# Patient Record
Sex: Male | Born: 1960 | Race: Black or African American | Hispanic: No | Marital: Married | Smoking: Current every day smoker
Health system: Southern US, Community
[De-identification: ages and names within clinical notes are randomized; demographics above are authoritative.]

## PROBLEM LIST (undated history)

## (undated) ENCOUNTER — Ambulatory Visit: Admission: EM | Payer: Medicaid Other | Source: Home / Self Care

## (undated) DIAGNOSIS — M2041 Other hammer toe(s) (acquired), right foot: Secondary | ICD-10-CM

## (undated) DIAGNOSIS — M1990 Unspecified osteoarthritis, unspecified site: Secondary | ICD-10-CM

## (undated) DIAGNOSIS — M4802 Spinal stenosis, cervical region: Secondary | ICD-10-CM

## (undated) DIAGNOSIS — M5412 Radiculopathy, cervical region: Secondary | ICD-10-CM

## (undated) HISTORY — PX: ABCESS DRAINAGE: SHX399

## (undated) HISTORY — DX: Unspecified osteoarthritis, unspecified site: M19.90

---

## 2011-03-23 ENCOUNTER — Inpatient Hospital Stay (INDEPENDENT_AMBULATORY_CARE_PROVIDER_SITE_OTHER)
Admission: RE | Admit: 2011-03-23 | Discharge: 2011-03-23 | Disposition: A | Discharge status: Home / Self Care | Payer: Self-pay | Source: Ambulatory Visit | Attending: Family Medicine | Admitting: Family Medicine

## 2011-03-23 DIAGNOSIS — L02519 Cutaneous abscess of unspecified hand: Secondary | ICD-10-CM

## 2015-04-07 ENCOUNTER — Ambulatory Visit: Payer: Self-pay

## 2015-05-08 ENCOUNTER — Ambulatory Visit: Payer: Self-pay

## 2016-08-23 ENCOUNTER — Emergency Department (HOSPITAL_COMMUNITY)
Admission: EM | Admit: 2016-08-23 | Discharge: 2016-08-24 | Disposition: A | Discharge status: Other Acute Inpatient Hospital | Payer: Self-pay | Attending: Emergency Medicine | Admitting: Emergency Medicine

## 2016-08-23 ENCOUNTER — Emergency Department (HOSPITAL_COMMUNITY): Payer: Self-pay

## 2016-08-23 ENCOUNTER — Encounter (HOSPITAL_COMMUNITY): Payer: Self-pay | Admitting: *Deleted

## 2016-08-23 DIAGNOSIS — K122 Cellulitis and abscess of mouth: Secondary | ICD-10-CM | POA: Insufficient documentation

## 2016-08-23 DIAGNOSIS — F17200 Nicotine dependence, unspecified, uncomplicated: Secondary | ICD-10-CM | POA: Insufficient documentation

## 2016-08-23 DIAGNOSIS — Z79899 Other long term (current) drug therapy: Secondary | ICD-10-CM | POA: Insufficient documentation

## 2016-08-23 LAB — URINALYSIS, ROUTINE W REFLEX MICROSCOPIC
Bilirubin Urine: NEGATIVE
Glucose, UA: NEGATIVE mg/dL
Hgb urine dipstick: NEGATIVE
Ketones, ur: 15 mg/dL — AB
LEUKOCYTES UA: NEGATIVE
NITRITE: NEGATIVE
Protein, ur: NEGATIVE mg/dL
SPECIFIC GRAVITY, URINE: 1.011 (ref 1.005–1.030)
pH: 5.5 (ref 5.0–8.0)

## 2016-08-23 LAB — COMPREHENSIVE METABOLIC PANEL
ALK PHOS: 100 U/L (ref 38–126)
ALT: 15 U/L — AB (ref 17–63)
ANION GAP: 14 (ref 5–15)
AST: 25 U/L (ref 15–41)
Albumin: 3.3 g/dL — ABNORMAL LOW (ref 3.5–5.0)
BUN: 9 mg/dL (ref 6–20)
CALCIUM: 9.5 mg/dL (ref 8.9–10.3)
CHLORIDE: 103 mmol/L (ref 101–111)
CO2: 18 mmol/L — ABNORMAL LOW (ref 22–32)
CREATININE: 1.22 mg/dL (ref 0.61–1.24)
Glucose, Bld: 57 mg/dL — ABNORMAL LOW (ref 65–99)
Potassium: 4.1 mmol/L (ref 3.5–5.1)
Sodium: 135 mmol/L (ref 135–145)
Total Bilirubin: 0.7 mg/dL (ref 0.3–1.2)
Total Protein: 8.4 g/dL — ABNORMAL HIGH (ref 6.5–8.1)

## 2016-08-23 LAB — I-STAT CHEM 8, ED
BUN: 10 mg/dL (ref 6–20)
CALCIUM ION: 1.14 mmol/L — AB (ref 1.15–1.40)
Chloride: 105 mmol/L (ref 101–111)
Creatinine, Ser: 1.3 mg/dL — ABNORMAL HIGH (ref 0.61–1.24)
Glucose, Bld: 57 mg/dL — ABNORMAL LOW (ref 65–99)
HCT: 45 % (ref 39.0–52.0)
HEMOGLOBIN: 15.3 g/dL (ref 13.0–17.0)
Potassium: 4 mmol/L (ref 3.5–5.1)
SODIUM: 137 mmol/L (ref 135–145)
TCO2: 21 mmol/L (ref 0–100)

## 2016-08-23 LAB — CBC WITH DIFFERENTIAL/PLATELET
BASOS ABS: 0 10*3/uL (ref 0.0–0.1)
Basophils Relative: 0 %
Eosinophils Absolute: 0.3 10*3/uL (ref 0.0–0.7)
Eosinophils Relative: 2 %
HEMATOCRIT: 41.4 % (ref 39.0–52.0)
Hemoglobin: 14.5 g/dL (ref 13.0–17.0)
LYMPHS PCT: 7 %
Lymphs Abs: 1.2 10*3/uL (ref 0.7–4.0)
MCH: 32.9 pg (ref 26.0–34.0)
MCHC: 35 g/dL (ref 30.0–36.0)
MCV: 93.9 fL (ref 78.0–100.0)
MONOS PCT: 8 %
Monocytes Absolute: 1.4 10*3/uL — ABNORMAL HIGH (ref 0.1–1.0)
Neutro Abs: 14 10*3/uL — ABNORMAL HIGH (ref 1.7–7.7)
Neutrophils Relative %: 83 %
PLATELETS: 210 10*3/uL (ref 150–400)
RBC: 4.41 MIL/uL (ref 4.22–5.81)
RDW: 12.3 % (ref 11.5–15.5)
WBC: 16.9 10*3/uL — AB (ref 4.0–10.5)

## 2016-08-23 LAB — I-STAT CG4 LACTIC ACID, ED
LACTIC ACID, VENOUS: 1.02 mmol/L (ref 0.5–1.9)
Lactic Acid, Venous: 2.97 mmol/L (ref 0.5–1.9)

## 2016-08-23 IMAGING — CT CT NECK W/ CM
3 of 4 series · 12 of 33 positions shown, 14 images · IV contrast (Omni 300)
Comparison: None.

CLINICAL DATA: Bilateral submandibular neck swelling.

EXAM:
CT NECK WITH CONTRAST
TECHNIQUE: Multidetector CT imaging of the neck was performed using the
standard protocol following the bolus administration of intravenous
contrast.
CONTRAST:  75mL [V0] IOPAMIDOL ([V0]) INJECTION 61%

[Series 4: neck 2.0 st · sagittal · 0.40mm/px · 5 of 101 slices shown, 6 images (1 of 2)]
[im 34/101  bone]
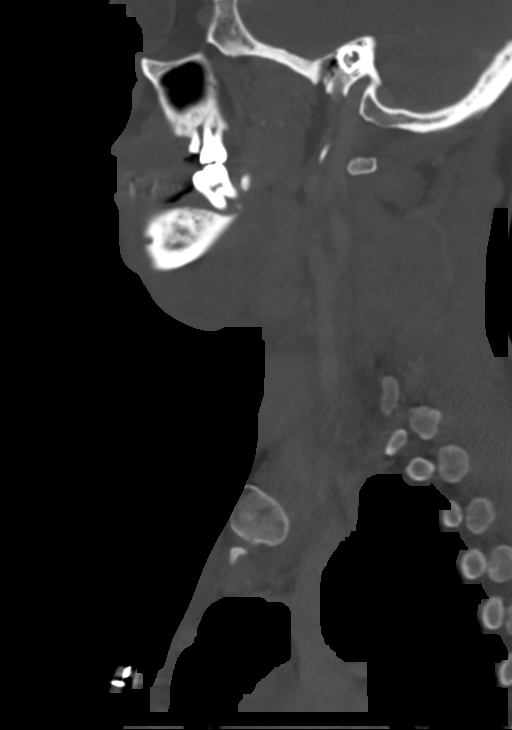
[im 42/101  bone]
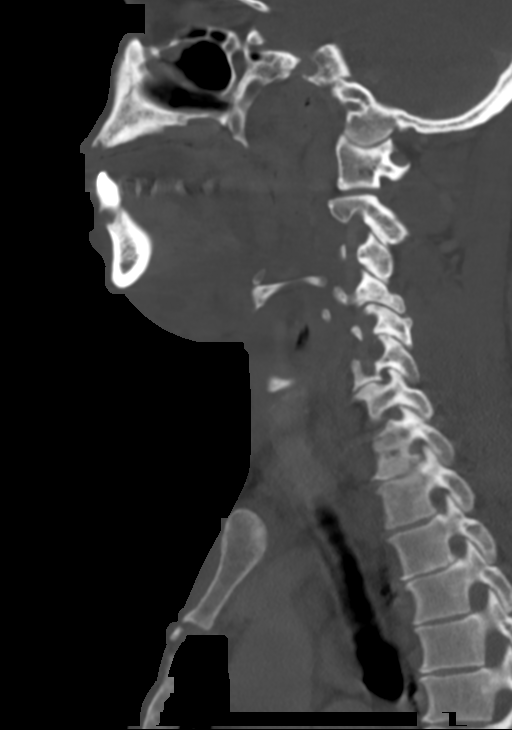
[im 51/101  soft-tissue]
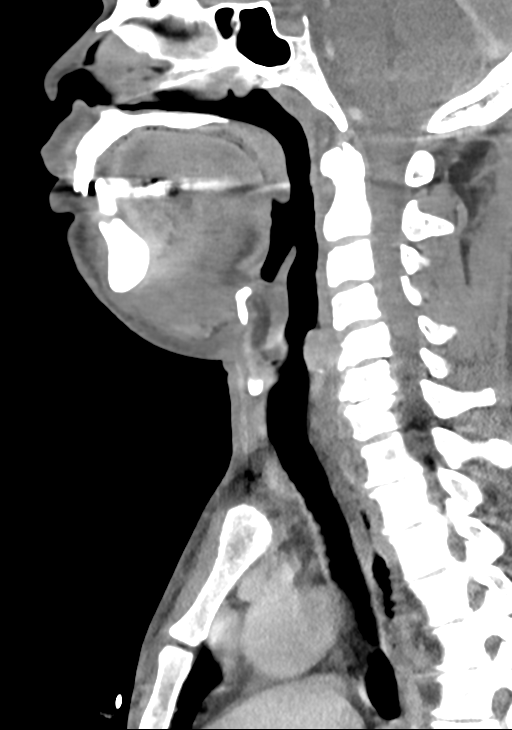
[im 51/101  bone]
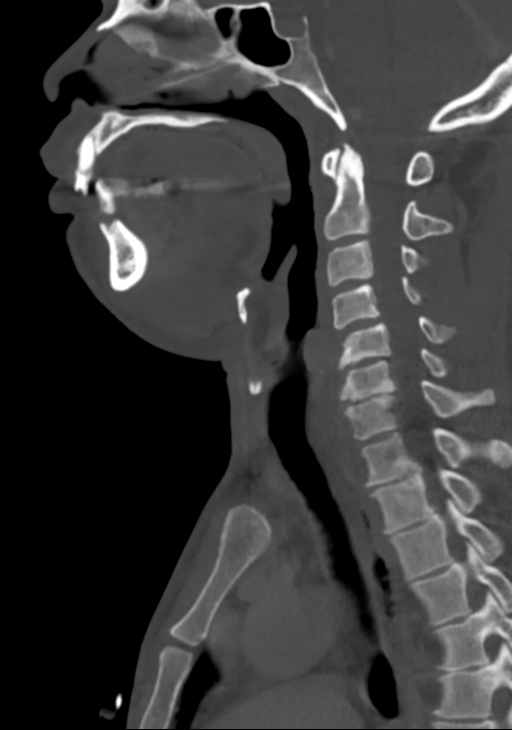
[im 59/101  bone]
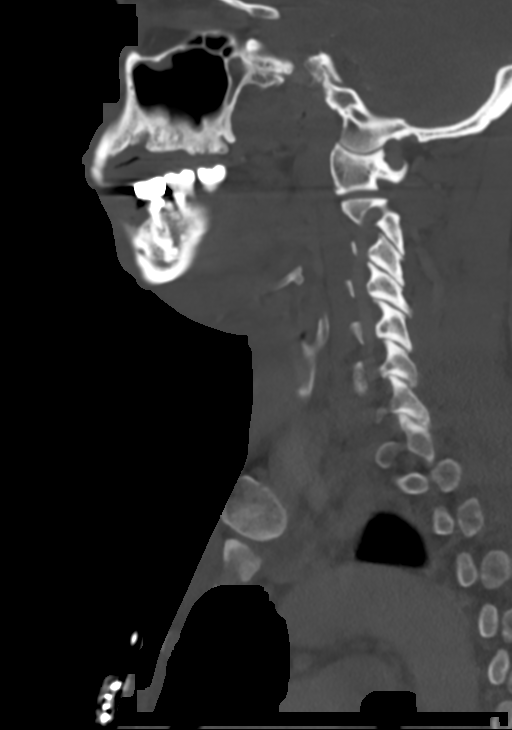
[im 67/101  bone]
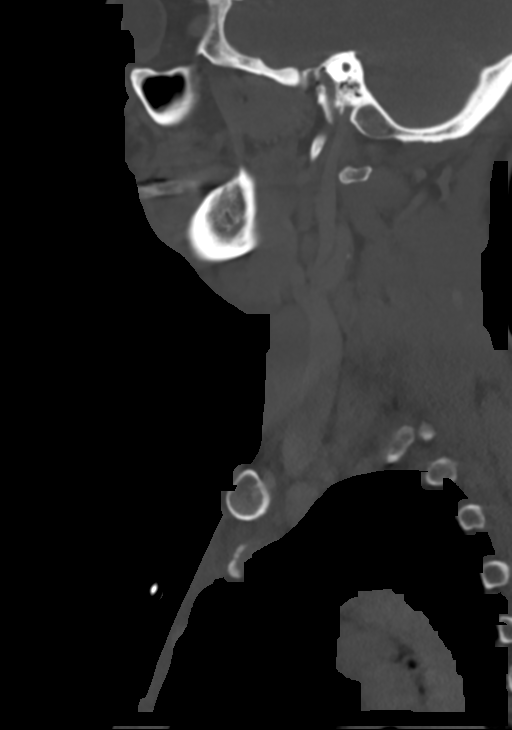

[Series 5: neck 2.0 st · coronal · 0.42mm/px · 3 of 92 slices shown (2 of 2)]
[im 19/92  bone]
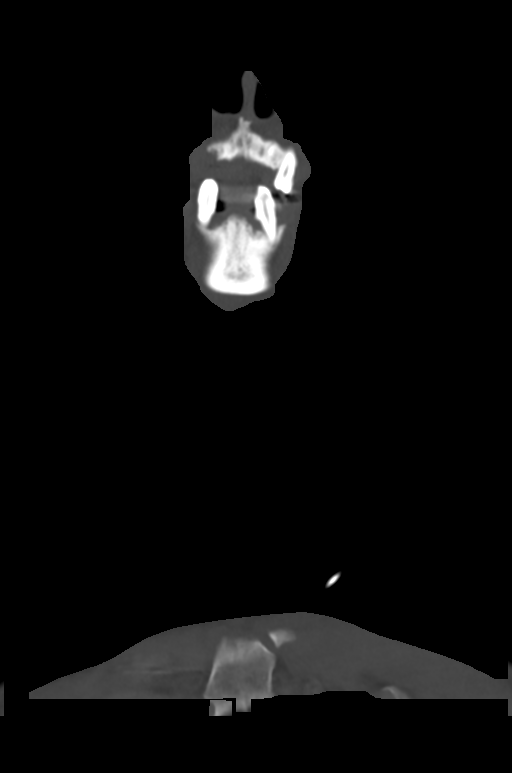
[im 37/92  bone]
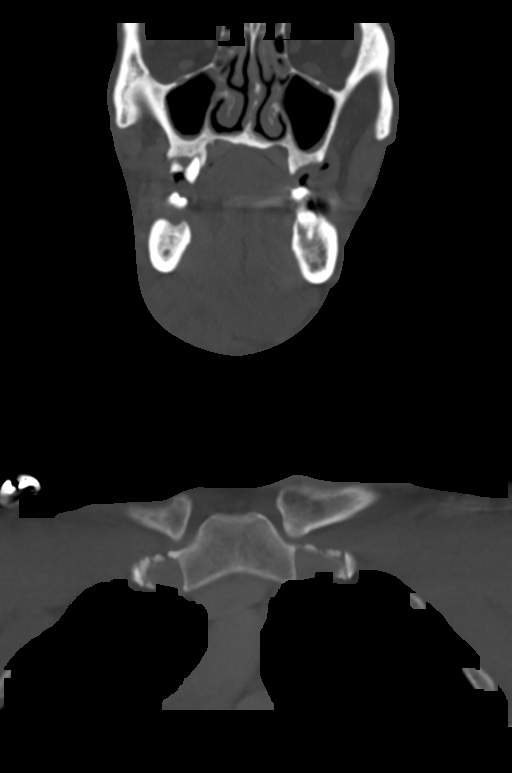
[im 55/92  bone]
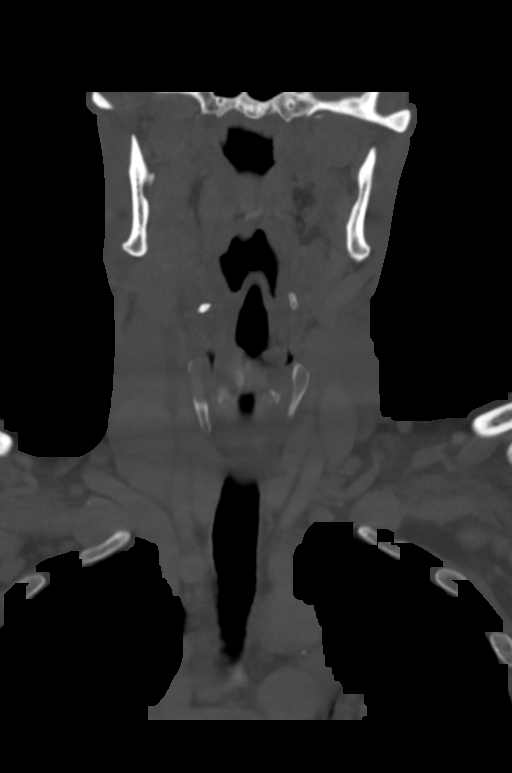

[Series 6: neck 2.0 st orthogonal · axial · 0.39mm/px · z∈[+783,+987]mm · 4 of 155 slices shown, 5 images]
[im 26/155  soft-tissue]
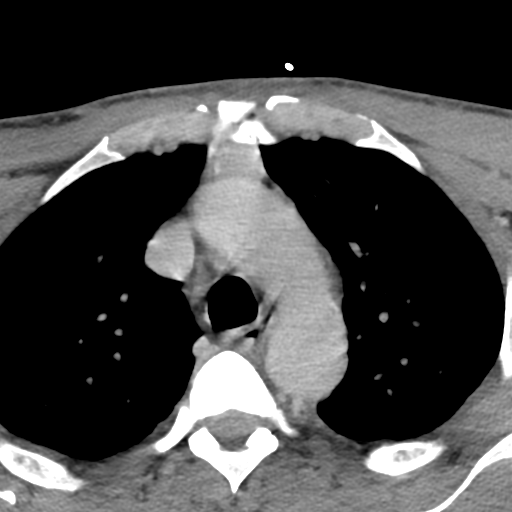
[im 26/155  bone]
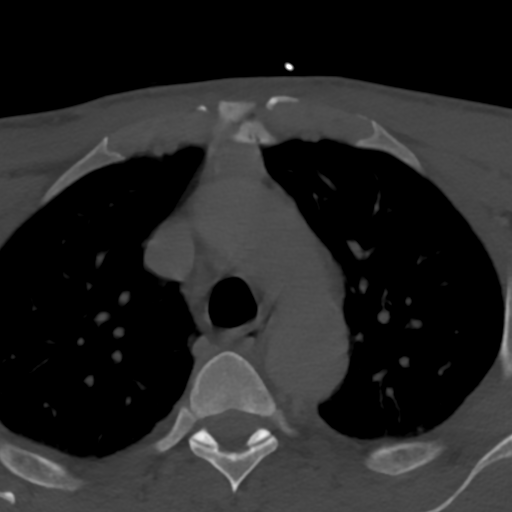
[im 52/155  bone]
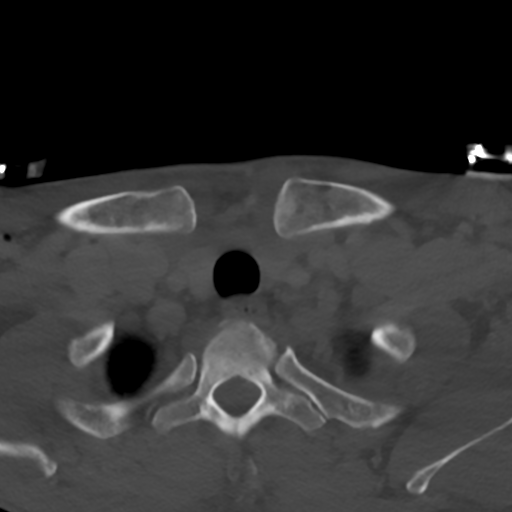
[im 103/155  bone]
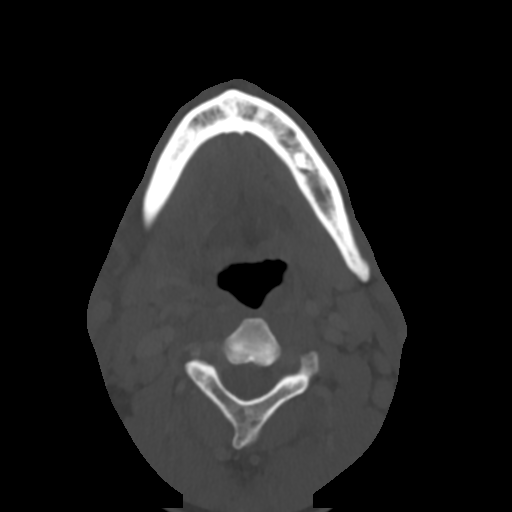
[im 129/155  bone]
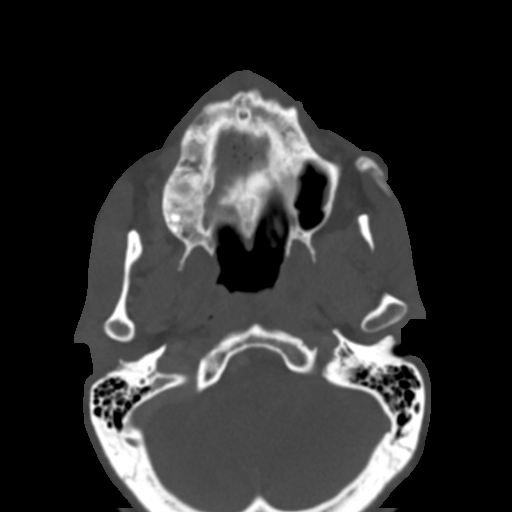

[12 of 33 positions shown; findings below may reference images not displayed]

FINDINGS: Marked area: The site of the palpable abnormality at the right
submandibular neck skin surface was marked by the technologist prior
to the scan using a radiopaque marker. Underlying the marker is a
peripherally contrast enhancing intermediate density collection
measuring 3.9 x 2.0 cm. Localizing the collection to a single space
is difficult given its size; it may occupy portions of the
submental, sublingual and submandibular spaces.

Oral cavity: There is extensive dental disease. Most notably, there
is a prominent periapical lucency surrounding the root of the sole
remaining right mandibular molar. This is in close proximity to the
above-described collection and is a potential source. There is a
small sinus tract extending from the periapical lucency through the
medial surface of the mandible (coronal image 46). There is edema of
the right mylohyoid muscle.

Pharynx and larynx: There is fullness of the right vallecula
secondary to mass effect from the above-described collection. There
is no current evidence of airway compromise. The nasopharynx is
clear.

Salivary glands: The right submandibular gland is displaced by the
above-described collection. Otherwise, the parotid and submandibular
glands are normal.

Thyroid: Normal

Lymph nodes: There are multiple enlarged lymph nodes bilaterally. On
the right at level IIa, there is a node measuring 1.6 cm in short
axis. On the left, at Level 3, there is a node measuring 1.2 cm in
short axis. The nodes show mild hyperenhancement without necrosis.

Vascular: The major cervical vessels are patent.

Limited intracranial: Normal

Visualized orbits: Normal

Mastoids and visualized paranasal sinuses: There is partial
opacification of the ethmoid air cells. The maxillary sinus,
sphenoid sinus and mastoid air cells are clear.

Skeleton: There is multilevel facet arthrosis and cervical
osteophytosis. No advanced bony spinal canal stenosis.

Upper chest: No pulmonary nodules or masses. Mild atherosclerotic
calcification of the aortic arch. Main pulmonary artery is at upper
limits normal.

Other: None
IMPRESSION: 1. Large, intermediate density collection within the right
sublingual/submandibular/submental space with associated peripheral
contrast enhancement consistent with phlegmon/early abscess
formation. Together with the prominent periapical lucency at the
root of the only remaining right mandibular molar, which is the most
likely source, the appearance is altogether in keeping with Ludwig's
angina.
2. Fullness of the right vallecula without airway compromise at this
time.
3. Bilateral reactive cervical lymphadenopathy.
4. Aortic atherosclerosis.

## 2016-08-23 MED ORDER — SODIUM CHLORIDE 0.9 % IV SOLN
3.0000 g | Freq: Once | INTRAVENOUS | Status: AC
  Administered 2016-08-23: 3 g via INTRAVENOUS
  Filled 2016-08-23: qty 3

## 2016-08-23 MED ORDER — KETOROLAC TROMETHAMINE 30 MG/ML IJ SOLN
30.0000 mg | Freq: Once | INTRAMUSCULAR | Status: AC
Start: 2016-08-23 — End: 2016-08-23
  Administered 2016-08-23: 30 mg via INTRAVENOUS
  Filled 2016-08-23: qty 1

## 2016-08-23 MED ORDER — ONDANSETRON HCL 4 MG/2ML IJ SOLN
4.0000 mg | Freq: Once | INTRAMUSCULAR | Status: AC
  Administered 2016-08-23: 4 mg via INTRAVENOUS
  Filled 2016-08-23: qty 2

## 2016-08-23 MED ORDER — SODIUM CHLORIDE 0.9 % IV BOLUS (SEPSIS)
1000.0000 mL | Freq: Once | INTRAVENOUS | Status: AC
  Administered 2016-08-23: 1000 mL via INTRAVENOUS

## 2016-08-23 MED ORDER — MORPHINE SULFATE (PF) 4 MG/ML IV SOLN
4.0000 mg | Freq: Once | INTRAVENOUS | Status: AC
  Administered 2016-08-23: 4 mg via INTRAVENOUS
  Filled 2016-08-23: qty 1

## 2016-08-23 MED ORDER — ACETAMINOPHEN 325 MG PO TABS
650.0000 mg | ORAL_TABLET | Freq: Once | ORAL | Status: AC
Start: 2016-08-23 — End: 2016-08-23
  Administered 2016-08-23: 650 mg via ORAL
  Filled 2016-08-23: qty 2

## 2016-08-23 MED ORDER — IOPAMIDOL (ISOVUE-300) INJECTION 61%
INTRAVENOUS | Status: AC
  Administered 2016-08-23: 75 mL
  Filled 2016-08-23: qty 75

## 2016-08-23 NOTE — ED Triage Notes (Signed)
The pt is c/o of a toothache  For one week he has swelliong and increased pain today

## 2016-08-23 NOTE — ED Provider Notes (Addendum)
[facility]-EMERGENCY DEPT Provider Note   CSN: [id number] Arrival date & time: 08/23/16  1914     History   Chief Complaint Chief Complaint  Patient presents with  . Dental Problem    HPI Mark Hampton is a 55 y.o. male.  Patient has had pain from his right mandibular third molar for a week. Less only sent submental and sublingual swelling. He has soft tissue swelling from his ear to his chin. States it is painful to swallow. Sometimes he states he spits white count and is less painful but he is able to swallow secretions. General take by mouth food and fluid. No difficult breathing.    HPI  History reviewed. No pertinent past medical history.  There are no active problems to display for this patient.   History reviewed. No pertinent surgical history.     Home Medications    Prior to Admission medications   Medication Sig Start Date End Date Taking? Authorizing Provider  ibuprofen (ADVIL,MOTRIN) 200 MG tablet Take 400 mg by mouth every 4 (four) hours as needed for moderate pain.   Yes Historical Provider, MD    Family History No family history on file.  Social History Social History  Substance Use Topics  . Smoking status: Current Every Day Smoker  . Smokeless tobacco: Never Used  . Alcohol use Yes     Allergies   Review of patient's allergies indicates no known allergies.   Review of Systems Review of Systems  Constitutional: Negative for appetite change, chills, diaphoresis, fatigue and fever.  HENT: Positive for dental problem, drooling, sore throat and trouble swallowing. Negative for mouth sores and voice change.   Eyes: Negative for visual disturbance.  Respiratory: Negative for cough, chest tightness, shortness of breath and wheezing.   Cardiovascular: Negative for chest pain.  Gastrointestinal: Negative for abdominal distention, abdominal pain, diarrhea, nausea and vomiting.  Endocrine: Negative for polydipsia, polyphagia and polyuria.  Genitourinary:  Negative for dysuria, frequency and hematuria.  Musculoskeletal: Positive for neck pain. Negative for gait problem.  Skin: Negative for color change, pallor and rash.  Neurological: Negative for dizziness, syncope, light-headedness and headaches.  Hematological: Does not bruise/bleed easily.  Psychiatric/Behavioral: Negative for behavioral problems and confusion.     Physical Exam Updated Vital Signs BP 121/72   Pulse 116   Temp 99.5 F (37.5 C) (Oral)   Resp 23   Ht 5\' 8"  (1.727 m)   Wt 139 lb 4.8 oz (63.2 kg)   SpO2 97%   BMI 21.18 kg/m   Physical Exam  Constitutional: He is oriented to person, place, and time. He appears well-developed and well-nourished. No distress.  HENT:  Head: Normocephalic.  Induration from the midline submental neck to the ramus of the mandible. For the mouth is prominent, but soft. Tongue is slightly elevated. However I can visualize his uvula. He is not frankly drooling. He is able to swallow secretions. He has anterior and posterior cervical lymphadenopathy. Neck is supple.  Eyes: Conjunctivae are normal. Pupils are equal, round, and reactive to light. No scleral icterus.  Neck: No thyromegaly present.  Cardiovascular: Normal rate and regular rhythm.  Exam reveals no gallop and no friction rub.   No murmur heard. Pulmonary/Chest: Effort normal and breath sounds normal. No respiratory distress. He has no wheezes. He has no rales.  Abdominal: Soft. Bowel sounds are normal. He exhibits no distension. There is no tenderness. There is no rebound.  Musculoskeletal: Normal range of motion.  Neurological: He is alert and  oriented to person, place, and time.  Skin: Skin is warm and dry. No rash noted.  Psychiatric: He has a normal mood and affect. His behavior is normal.     ED Treatments / Results  Labs (all labs ordered are listed, but only abnormal results are displayed) Labs Reviewed  CBC WITH DIFFERENTIAL/PLATELET - Abnormal; Notable for the  following:       Result Value   WBC 16.9 (*)    Neutro Abs 14.0 (*)    Monocytes Absolute 1.4 (*)    All other components within normal limits  COMPREHENSIVE METABOLIC PANEL - Abnormal; Notable for the following:    CO2 18 (*)    Glucose, Bld 57 (*)    Total Protein 8.4 (*)    Albumin 3.3 (*)    ALT 15 (*)    All other components within normal limits  URINALYSIS, ROUTINE W REFLEX MICROSCOPIC (NOT AT [facility]) - Abnormal; Notable for the following:    Ketones, ur 15 (*)    All other components within normal limits  I-STAT CHEM 8, ED - Abnormal; Notable for the following:    Creatinine, Ser 1.30 (*)    Glucose, Bld 57 (*)    Calcium, Ion 1.14 (*)    All other components within normal limits  I-STAT CG4 LACTIC ACID, ED - Abnormal; Notable for the following:    Lactic Acid, Venous 2.97 (*)    All other components within normal limits  CULTURE, BLOOD (ROUTINE X 2)  CULTURE, BLOOD (ROUTINE X 2)  URINE CULTURE  I-STAT CG4 LACTIC ACID, ED    EKG  EKG Interpretation None       Radiology Ct Soft Tissue Neck W Contrast  Result Date: 08/23/2016 CLINICAL DATA:  Bilateral submandibular neck swelling. EXAM: CT NECK WITH CONTRAST TECHNIQUE: Multidetector CT imaging of the neck was performed using the standard protocol following the bolus administration of intravenous contrast. CONTRAST:  75mL ISOVUE-300 IOPAMIDOL (ISOVUE-300) INJECTION 61% COMPARISON:  None. FINDINGS: Marked area: The site of the palpable abnormality at the right submandibular neck skin surface was marked by the technologist prior to the scan using a radiopaque marker. Underlying the marker is a peripherally contrast enhancing intermediate density collection measuring 3.9 x 2.0 cm. Localizing the collection to a single space is difficult given its size; it may occupy portions of the submental, sublingual and submandibular spaces. Oral cavity: There is extensive dental disease. Most notably, there is a prominent periapical lucency  surrounding the root of the sole remaining right mandibular molar. This is in close proximity to the above-described collection and is a potential source. There is a small sinus tract extending from the periapical lucency through the medial surface of the mandible (coronal image 46). There is edema of the right mylohyoid muscle. Pharynx and larynx: There is fullness of the right vallecula secondary to mass effect from the above-described collection. There is no current evidence of airway compromise. The nasopharynx is clear. Salivary glands: The right submandibular gland is displaced by the above-described collection. Otherwise, the parotid and submandibular glands are normal. Thyroid: Normal Lymph nodes: There are multiple enlarged lymph nodes bilaterally. On the right at level IIa, there is a node measuring 1.6 cm in short axis. On the left, at Level 3, there is a node measuring 1.2 cm in short axis. The nodes show mild hyperenhancement without necrosis. Vascular: The major cervical vessels are patent. Limited intracranial: Normal Visualized orbits: Normal Mastoids and visualized paranasal sinuses: There is partial opacification of  the ethmoid air cells. The maxillary sinus, sphenoid sinus and mastoid air cells are clear. Skeleton: There is multilevel facet arthrosis and cervical osteophytosis. No advanced bony spinal canal stenosis. Upper chest: No pulmonary nodules or masses. Mild atherosclerotic calcification of the aortic arch. Main pulmonary artery is at upper limits normal. Other: None IMPRESSION: 1. Large, intermediate density collection within the right sublingual/submandibular/submental space with associated peripheral contrast enhancement consistent with phlegmon/early abscess formation. 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 2. Fullness of the  right vallecula without airway compromise at this time. 3. Bilateral reactive cervical lymphadenopathy. 4. Aortic atherosclerosis. Electronically Signed   By: Deatra RobinsonKevin  Herman M.D.   On: 08/23/2016 22:41    Procedures Procedures (including critical care time)  Medications Ordered in ED Medications  morphine 4 MG/ML injection 4 mg (4 mg Intravenous Given 08/23/16 2111)  ketorolac (TORADOL) 30 MG/ML injection 30 mg (30 mg Intravenous Given 08/23/16 2108)  Ampicillin-Sulbactam (UNASYN) 3 g in sodium chloride 0.9 % 100 mL IVPB (0 g Intravenous Stopped 08/23/16 2141)  sodium chloride 0.9 % bolus 1,000 mL (0 mLs Intravenous Stopped 08/23/16 2136)  iopamidol (ISOVUE-300) 61 % injection (75 mLs  Contrast Given 08/23/16 2151)  sodium chloride 0.9 % bolus 1,000 mL (1,000 mLs Intravenous New Bag/Given 08/23/16 2230)  acetaminophen (TYLENOL) tablet 650 mg (650 mg Oral Given 08/23/16 2228)  ondansetron (ZOFRAN) injection 4 mg (4 mg Intravenous Given 08/23/16 2230)  morphine 4 MG/ML injection 4 mg (4 mg Intravenous Given 08/23/16 2231)     Initial Impression / Assessment and Plan / ED Course  I have reviewed the triage vital signs and the nursing notes.  Pertinent labs & imaging results that were available during my care of the patient were reviewed by me and considered in my medical decision making (see chart for details).  Clinical Course    Skin reviewed. Patient is given IV fluids and antibiotics. No oral surgeon available tonight. I discussed the case with ENT. They felt that patient needed antibiotics and tooth extraction. We do not have dental available for consultation. [facility] and [facility]  Final Clinical Impressions(s) / ED Diagnoses   Final diagnoses:  Ludwig's angina    New Prescriptions New Prescriptions   No medications on file     Rolland PorterMark Kolby Schara, MD 08/23/16 2329    Rolland PorterMark Lauree Yurick, MD 08/23/16 2330

## 2016-08-23 NOTE — ED Notes (Signed)
Dr. Fayrene FearingJames was informed of critical lab.. (KT)

## 2016-08-23 NOTE — ED Provider Notes (Signed)
MSE was initiated and I personally evaluated the patient and placed orders (if any) at  8:44 PM on August 23, 2016.  Mark Hampton is a 55 y.o. male who presents to the Emergency Department complaining of left lower dental pain onset 1 week. Pt denies issues with this tooth in the past. He states that he is having associated symptoms of subjective fever, left lower jaw swelling, and appetite change due to pain. He denies drooling however states he has been "spitting" due to difficulty swallowing secretions. He is a current smoker. He states that has not tried any medications for the relief for his symptoms. Pt denies allergies to any medications.  PE: There is marked swelling in the right submandibular area extending to the left side with moderate trismus. Dentition is poor with multiple dental caries and fillings. There is also a spongy mass with small white plaques under the tongue on the right side which is non-tender to palpation.  Due to abnormalities in vital signs and marked submandibular swelling, will have patient transferred for further workup and repeat evaluation.   The patient appears stable so that the remainder of the MSE may be completed by another provider.   Mark BornKelly Marie Gekas, PA-C 08/23/16 2049    Mark PorterMark James, MD 09/16/16 2106

## 2016-08-24 DIAGNOSIS — K047 Periapical abscess without sinus: Secondary | ICD-10-CM | POA: Insufficient documentation

## 2016-08-24 DIAGNOSIS — L0211 Cutaneous abscess of neck: Secondary | ICD-10-CM | POA: Insufficient documentation

## 2016-08-24 HISTORY — PX: INCISION AND DRAINAGE: SHX5863

## 2016-08-24 HISTORY — PX: DENTAL SURGERY: SHX609

## 2016-08-25 LAB — URINE CULTURE: CULTURE: NO GROWTH

## 2016-08-26 MED FILL — Morphine Sulfate Inj 10 MG/ML: INTRAMUSCULAR | Qty: 1 | Status: AC

## 2016-08-28 LAB — CULTURE, BLOOD (ROUTINE X 2)
Culture: NO GROWTH
Culture: NO GROWTH

## 2018-10-20 ENCOUNTER — Emergency Department (HOSPITAL_COMMUNITY)
Admission: EM | Admit: 2018-10-20 | Discharge: 2018-10-20 | Disposition: A | Discharge status: Home / Self Care | Payer: Self-pay | Attending: Emergency Medicine | Admitting: Emergency Medicine

## 2018-10-20 ENCOUNTER — Emergency Department (HOSPITAL_COMMUNITY): Payer: Self-pay

## 2018-10-20 ENCOUNTER — Other Ambulatory Visit: Payer: Self-pay

## 2018-10-20 ENCOUNTER — Encounter (HOSPITAL_COMMUNITY): Payer: Self-pay | Admitting: Emergency Medicine

## 2018-10-20 DIAGNOSIS — F17200 Nicotine dependence, unspecified, uncomplicated: Secondary | ICD-10-CM | POA: Insufficient documentation

## 2018-10-20 DIAGNOSIS — M25552 Pain in left hip: Secondary | ICD-10-CM | POA: Insufficient documentation

## 2018-10-20 IMAGING — CR DG HIP (WITH OR WITHOUT PELVIS) 2-3V*L*
3 series · 3 of 3 positions shown · non-contrast
Comparison: None.

CLINICAL DATA: Left hip pain for the past 2 weeks. No difficulty
bending the hip or ambulating.

EXAM:
DG HIP (WITH OR WITHOUT PELVIS) 2-3V LEFT

[t pelvis ap]
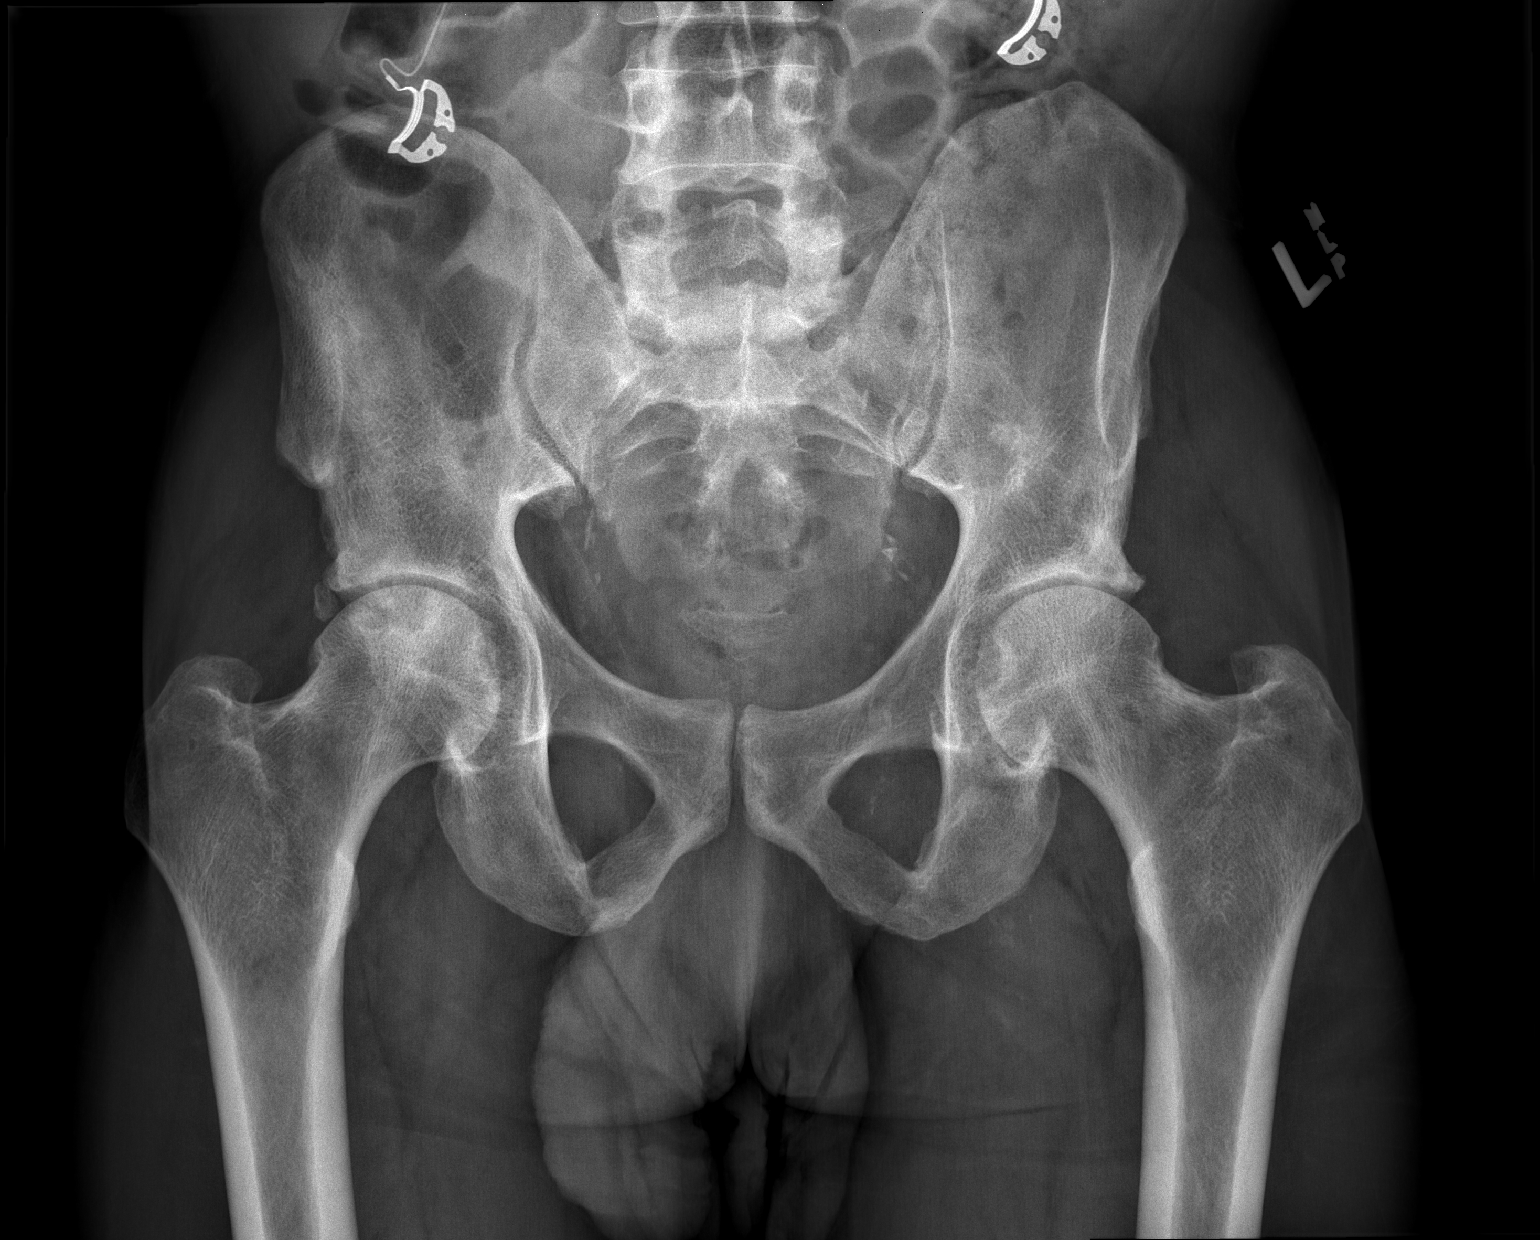

[t hip ap left]
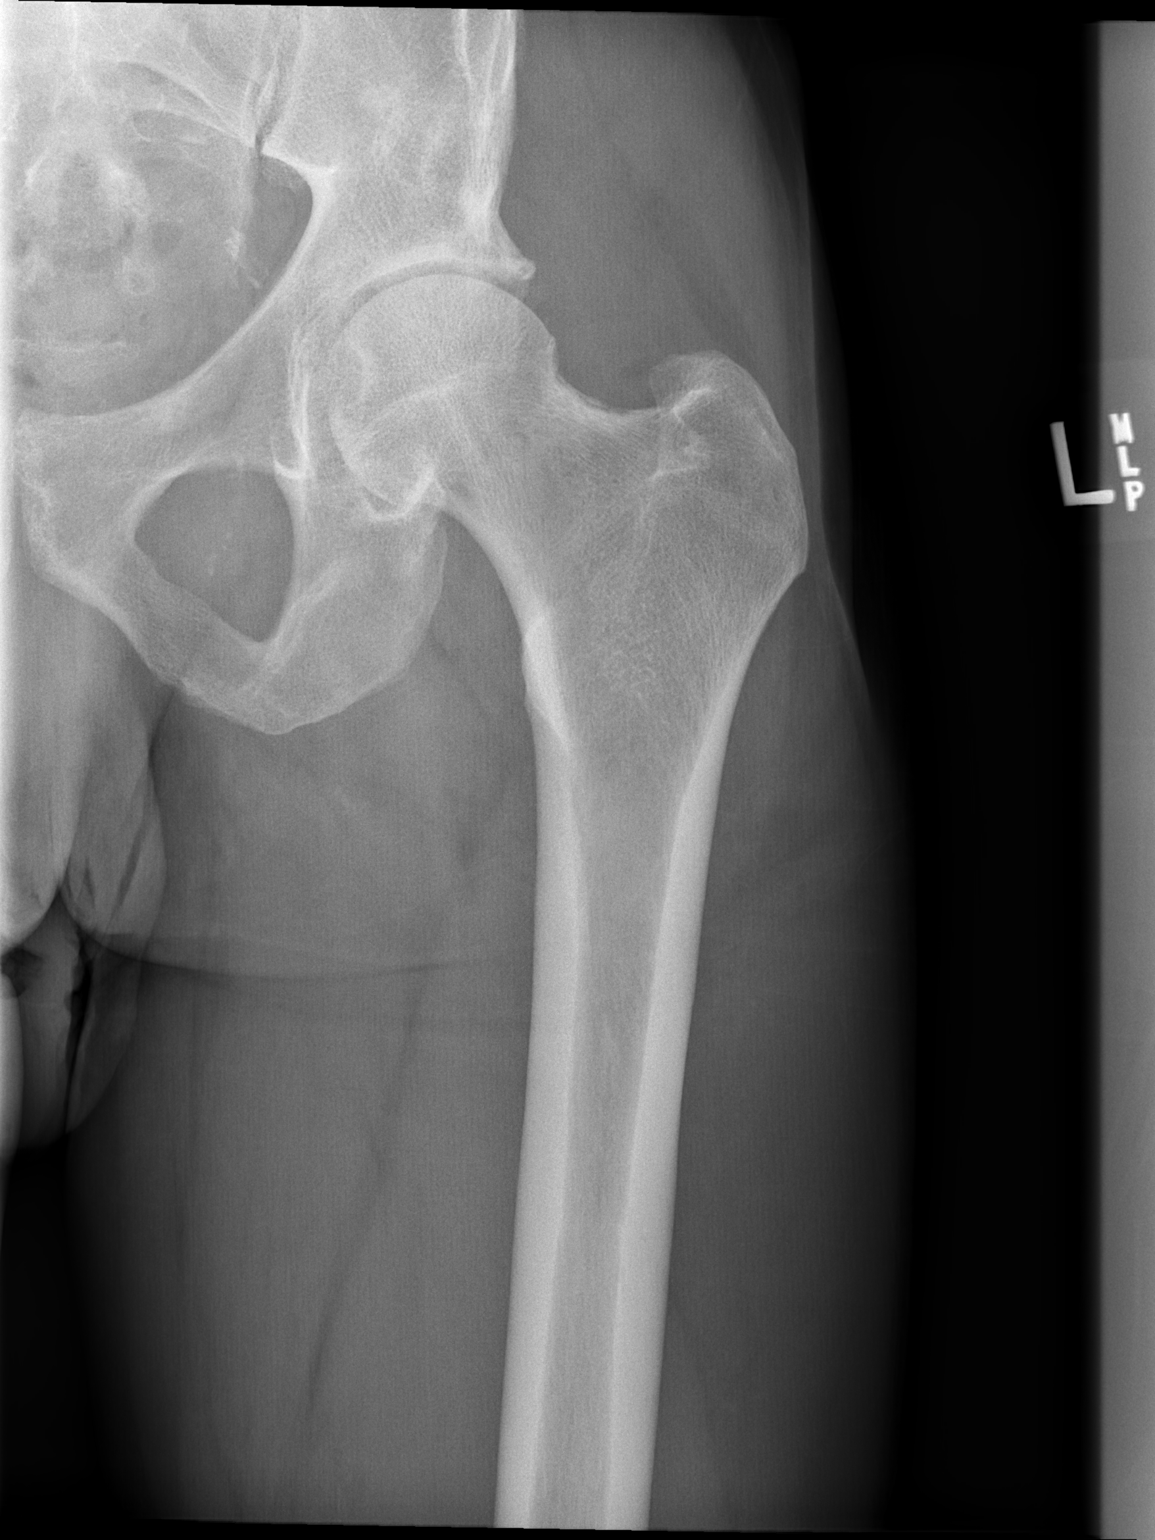

[t hip frog leg left]
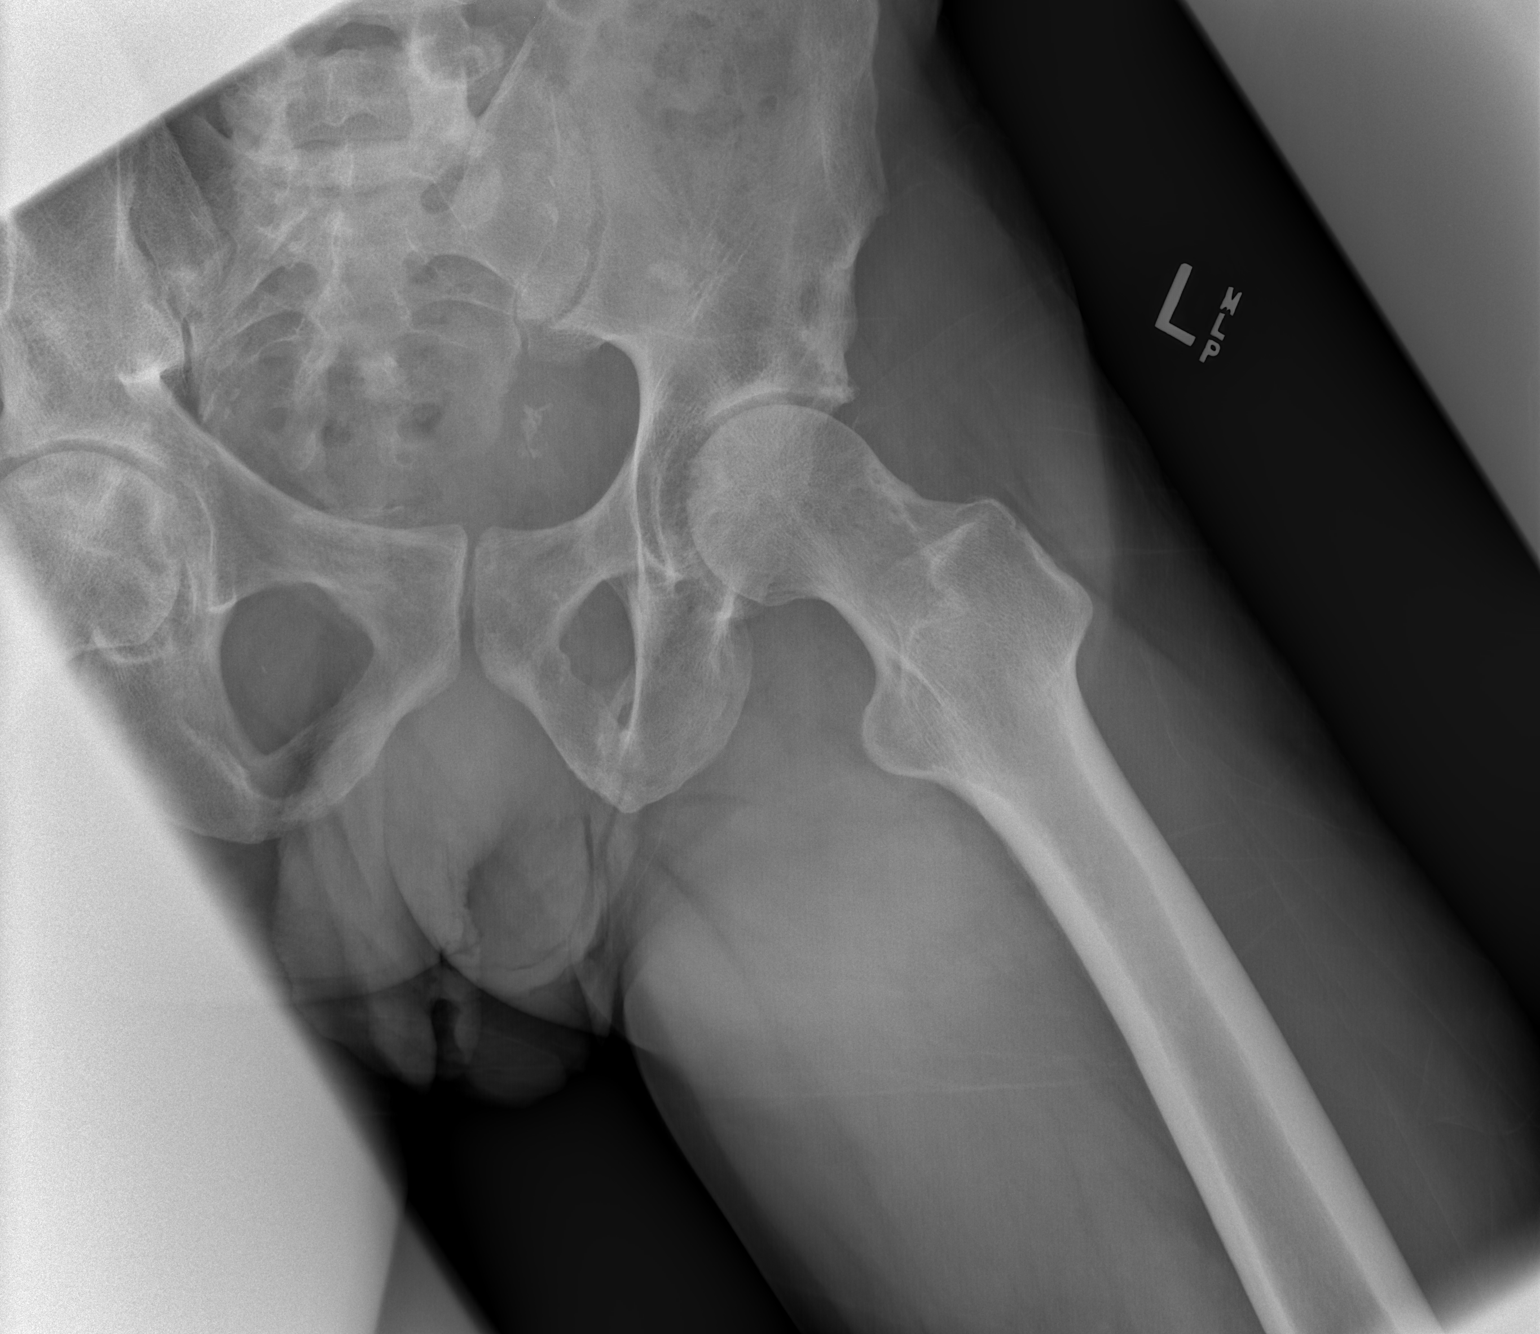

[3 of 3 positions shown; findings below may reference images not displayed]

FINDINGS: There are subarticular cystic changes in the right femoral head. On
the left no similar changes are observed. There is subjective mild
narrowing of the hip joint spaces bilaterally. There is a tiny
subcentimeter lucency within the femoral neck which is nonspecific.
The intertrochanteric and immediate subtrochanteric regions are
normal. The bony pelvis exhibits no acute abnormality.
IMPRESSION: Mild osteoarthritic joint space loss of both hips. I can not exclude
changes of avascular necrosis in the right femoral head but no
similar changes are noted on the left. No acute fracture nor
dislocation. If the patient's left hip symptoms persist or worsen
and lumbar radiculopathy has been excluded, MRI of the hips would be
a useful next imaging step.

## 2018-10-20 MED ORDER — METHOCARBAMOL 500 MG PO TABS: 500.0000 mg | ORAL_TABLET | Freq: Two times a day (BID) | ORAL | 0 refills | Status: DC

## 2018-10-20 MED ORDER — METHOCARBAMOL 500 MG PO TABS
500.0000 mg | ORAL_TABLET | Freq: Once | ORAL | Status: AC
  Administered 2018-10-20: 500 mg via ORAL
  Filled 2018-10-20: qty 1

## 2018-10-20 MED ORDER — LIDO-CAPSAICIN-MEN-METHYL SAL 0.5-0.035-5-20 % EX PTCH: 1.0000 | MEDICATED_PATCH | Freq: Every day | CUTANEOUS | 0 refills | Status: DC | PRN

## 2018-10-20 MED ORDER — KETOROLAC TROMETHAMINE 60 MG/2ML IM SOLN
60.0000 mg | Freq: Once | INTRAMUSCULAR | Status: AC
  Administered 2018-10-20: 60 mg via INTRAMUSCULAR
  Filled 2018-10-20: qty 2

## 2018-10-20 MED ORDER — MELOXICAM 7.5 MG PO TABS: 7.5000 mg | ORAL_TABLET | Freq: Every day | ORAL | 0 refills | Status: DC

## 2018-10-20 NOTE — ED Triage Notes (Signed)
Patient is complaining of left hip pain. Patient states he does not know what happened to it. Patient states it started hurting two weeks ago. Patient states it is getting worse.

## 2018-10-20 NOTE — ED Provider Notes (Signed)
Emergency Department Provider Note   I have reviewed the triage vital signs and the nursing notes.   HISTORY  Chief Complaint Hip Pain   HPI Mark Hampton is a 57 y.o. male without significant past medical history the presents to the emergency department today secondary to hip pain.  Patient states is been going on for proximally a month but significantly worse over the last couple weeks has had lateral left hip pain is worse with weightbearing weight to the point is having difficulty walking at this point.  Does not remember any injuries or do anything to pull it.  Patient does not have any recent illnesses.  No history of cancer that he knows of.  No IV drug use. No other associated or modifying symptoms.    History reviewed. No pertinent past medical history.  There are no active problems to display for this patient.   History reviewed. No pertinent surgical history.  Current Outpatient Rx  . Order #: [id number] Class: Historical Med  . Order #: [id number] Class: Historical Med  . Order #: [id number] Class: Historical Med  . Order #: [id number] Class: Normal  . Order #: [id number] Class: Normal  . Order #: [id number] Class: Normal    Allergies Patient has no known allergies.  History reviewed. No pertinent family history.  Social History Social History   Tobacco Use  . Smoking status: Current Every Day Smoker  . Smokeless tobacco: Never Used  Substance Use Topics  . Alcohol use: Yes  . Drug use: Not on file    Review of Systems  All other systems negative except as documented in the HPI. All pertinent positives and negatives as reviewed in the HPI. ____________________________________________   PHYSICAL EXAM:  VITAL SIGNS: ED Triage Vitals  Enc Vitals Group     BP 10/20/18 0709 129/88     Pulse Rate 10/20/18 0709 68     Resp 10/20/18 0709 16     Temp 10/20/18 0709 97.9 F (36.6 C)     Temp Source 10/20/18 0709 Oral     SpO2 10/20/18 0709 99 %     Weight  10/20/18 0707 140 lb (63.5 kg)     Height 10/20/18 0707 5\' 6"  (1.676 m)     Head Circumference --      Peak Flow --      Pain Score 10/20/18 0707 9     Pain Loc --      Pain Edu? --      Excl. in GC? --     Constitutional: Alert and oriented. Well appearing and in no acute distress. Eyes: Conjunctivae are normal. PERRL. EOMI. Head: Atraumatic. Nose: No congestion/rhinnorhea. Mouth/Throat: Mucous membranes are moist.  Oropharynx non-erythematous. Neck: No stridor.  No meningeal signs.   Cardiovascular: Normal rate, regular rhythm. Good peripheral circulation. Grossly normal heart sounds.   Respiratory: Normal respiratory effort.  No retractions. Lungs CTAB. Gastrointestinal: Soft and nontender. No distention.  Musculoskeletal: Pain with range of motion of his left hip.  Pain with palpation of his lateral hip and gluteal area.. Neurologic:  Normal speech and language. No gross focal neurologic deficits are appreciated.  Skin:  Skin is warm, dry and intact. No rash noted.   ____________________________________________   RADIOLOGY  Dg Hip Unilat W Or Wo Pelvis 2-3 Views Left  Result Date: 10/20/2018 CLINICAL DATA:  Left hip pain for the past 2 weeks. No difficulty bending the hip or ambulating. EXAM: DG HIP (WITH OR WITHOUT PELVIS) 2-3V LEFT COMPARISON:  None. FINDINGS: There are  subarticular cystic changes in the right femoral head. On the left no similar changes are observed. There is subjective mild narrowing of the hip joint spaces bilaterally. There is a tiny subcentimeter lucency within the femoral neck which is nonspecific. The intertrochanteric and immediate subtrochanteric regions are normal. The bony pelvis exhibits no acute abnormality. 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 I can not exclude changes of avascular necrosis in the right femoral head but no similar changes are noted on the left. No acute fracture nor dislocation. If the patient's left hip  symptoms persist or worsen and lumbar radiculopathy has been excluded, MRI of the hips would be a useful next imaging step. Electronically Signed   By: David  SwazilandJordan M.D.   On: 10/20/2018 09:42    ____________________________________________   INITIAL IMPRESSION / ASSESSMENT AND PLAN / ED COURSE  Secondary to age and the time that he has had this pain that is atraumatic we will get an x-ray to ensure no osseous lesions or occult fractures.  Will treat as a muscle strain in the meantime and have him follow-up with PCP/aches in a week if not get better with this conservative treatment.  xr without left hip issues. Will treat conservatively with sports medicine follow up.      Pertinent labs & imaging results that were available during my care of the patient were reviewed by me and considered in my medical decision making (see chart for details).  ____________________________________________  FINAL CLINICAL IMPRESSION(S) / ED DIAGNOSES  Final diagnoses:  Pain of left hip joint     MEDICATIONS GIVEN DURING THIS VISIT:  Medications  ketorolac (TORADOL) injection 60 mg (60 mg Intramuscular Given 10/20/18 0917)  methocarbamol (ROBAXIN) tablet 500 mg (500 mg Oral Given 10/20/18 0917)     NEW OUTPATIENT MEDICATIONS STARTED DURING THIS VISIT:  New Prescriptions   LIDO-CAPSAICIN-MEN-METHYL SAL 0.5-0.035-5-20 % PTCH    Apply 1 patch topically daily as needed (pain).   MELOXICAM (MOBIC) 7.5 MG TABLET    Take 1 tablet (7.5 mg total) by mouth daily.   METHOCARBAMOL (ROBAXIN) 500 MG TABLET    Take 1 tablet (500 mg total) by mouth 2 (two) times daily.    Note:  This note was prepared with assistance of Dragon voice recognition software. Occasional wrong-word or sound-a-like substitutions may have occurred due to the inherent limitations of voice recognition software.   Marily MemosMesner, Cieanna Stormes, MD 10/20/18 1024

## 2019-10-17 ENCOUNTER — Emergency Department (HOSPITAL_COMMUNITY): Payer: Self-pay

## 2019-10-17 ENCOUNTER — Emergency Department (HOSPITAL_COMMUNITY)
Admission: EM | Admit: 2019-10-17 | Discharge: 2019-10-17 | Disposition: A | Discharge status: Home / Self Care | Payer: Self-pay | Attending: Emergency Medicine | Admitting: Emergency Medicine

## 2019-10-17 ENCOUNTER — Other Ambulatory Visit: Payer: Self-pay

## 2019-10-17 ENCOUNTER — Encounter (HOSPITAL_COMMUNITY): Payer: Self-pay | Admitting: Emergency Medicine

## 2019-10-17 DIAGNOSIS — R42 Dizziness and giddiness: Secondary | ICD-10-CM | POA: Insufficient documentation

## 2019-10-17 DIAGNOSIS — R202 Paresthesia of skin: Secondary | ICD-10-CM | POA: Insufficient documentation

## 2019-10-17 DIAGNOSIS — M25512 Pain in left shoulder: Secondary | ICD-10-CM | POA: Insufficient documentation

## 2019-10-17 DIAGNOSIS — Z79899 Other long term (current) drug therapy: Secondary | ICD-10-CM | POA: Insufficient documentation

## 2019-10-17 DIAGNOSIS — F17200 Nicotine dependence, unspecified, uncomplicated: Secondary | ICD-10-CM | POA: Insufficient documentation

## 2019-10-17 LAB — BASIC METABOLIC PANEL
Anion gap: 9 (ref 5–15)
BUN: 12 mg/dL (ref 6–20)
CO2: 25 mmol/L (ref 22–32)
Calcium: 9.6 mg/dL (ref 8.9–10.3)
Chloride: 102 mmol/L (ref 98–111)
Creatinine, Ser: 1.01 mg/dL (ref 0.61–1.24)
GFR calc Af Amer: >60 mL/min (ref >60)
GFR calc non Af Amer: >60 mL/min (ref >60)
Glucose, Bld: 93 mg/dL (ref 70–99)
Potassium: 4.2 mmol/L (ref 3.5–5.1)
Sodium: 136 mmol/L (ref 135–145)

## 2019-10-17 LAB — CBC
HCT: 39.7 % (ref 39.0–52.0)
Hemoglobin: 13.8 g/dL (ref 13.0–17.0)
MCH: 34.6 pg — ABNORMAL HIGH (ref 26.0–34.0)
MCHC: 34.8 g/dL (ref 30.0–36.0)
MCV: 99.5 fL (ref 80.0–100.0)
Platelets: 234 10*3/uL (ref 150–400)
RBC: 3.99 MIL/uL — ABNORMAL LOW (ref 4.22–5.81)
RDW: 12.3 % (ref 11.5–15.5)
WBC: 5.9 10*3/uL (ref 4.0–10.5)
nRBC: 0 % (ref 0.0–0.2)

## 2019-10-17 LAB — TROPONIN I (HIGH SENSITIVITY): Troponin I (High Sensitivity): 4 ng/L (ref <18)

## 2019-10-17 IMAGING — MR MR HEAD W/O CM
7 of 11 series · 25 of 48 positions shown · non-contrast
Comparison: None.

CLINICAL DATA: New onset vertigo. Left upper extremity pain and
tingling. Unsteadiness for 2-3 weeks.

EXAM:
MRI HEAD WITHOUT CONTRAST
TECHNIQUE: Multiplanar, multiecho pulse sequences of the brain and surrounding
structures were obtained without intravenous contrast.

[Series 2: DWI · axial · 3.0mm · 0.94mm/px · z∈[-81,+64]mm · 7 of 104 slices shown (1 of 2)]
[im 1/104]
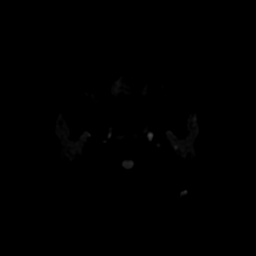
[im 18/104]
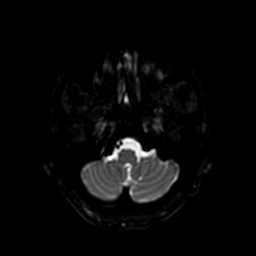
[im 35/104]
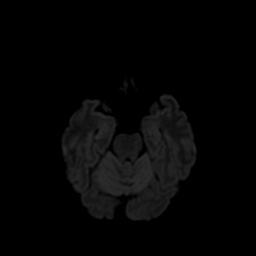
[im 52/104]
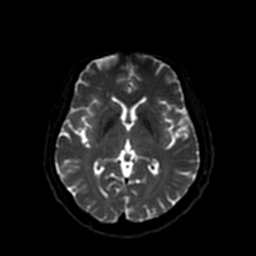
[im 69/104]
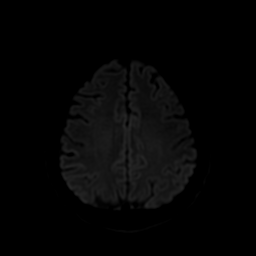
[im 86/104]
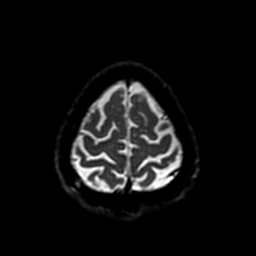
[im 104/104]
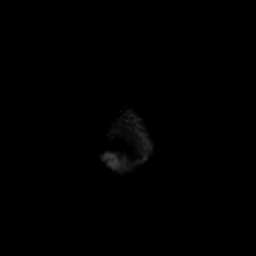

[Series 3: DWI · coronal · 4.0mm · 0.94mm/px · 6 of 74 slices shown (2 of 2)]
[im 1/74]
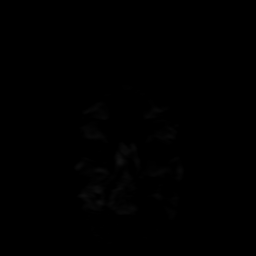
[im 15/74]
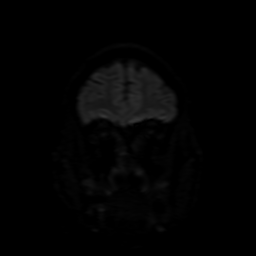
[im 30/74]
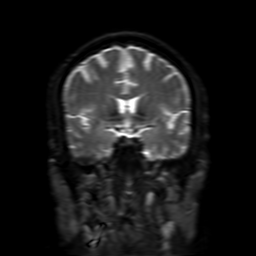
[im 44/74]
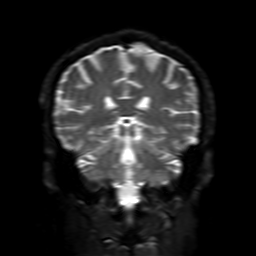
[im 59/74]
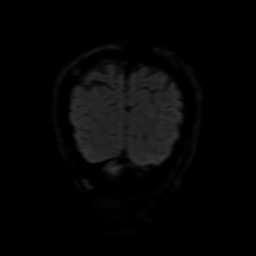
[im 74/74]
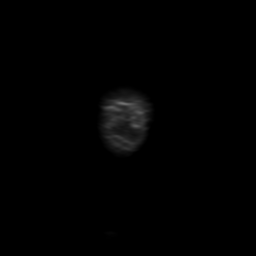

[Series 4: FLAIR · axial · 3.0mm · 0.41mm/px · z∈[-74,+61]mm · 2 of 25 slices shown (1 of 2)]
[im 1/25]
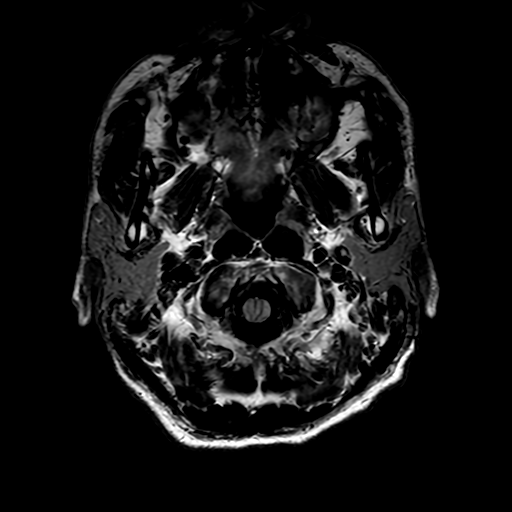
[im 25/25]
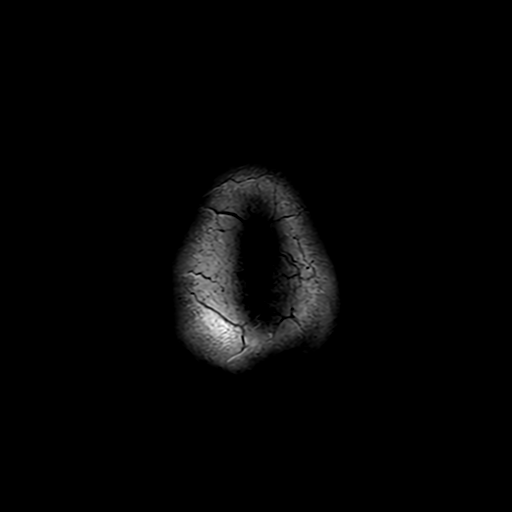

[Series 6: FLAIR · sagittal · 5.0mm · 0.23mm/px · 2 of 25 slices shown (2 of 2)]
[im 1/25]
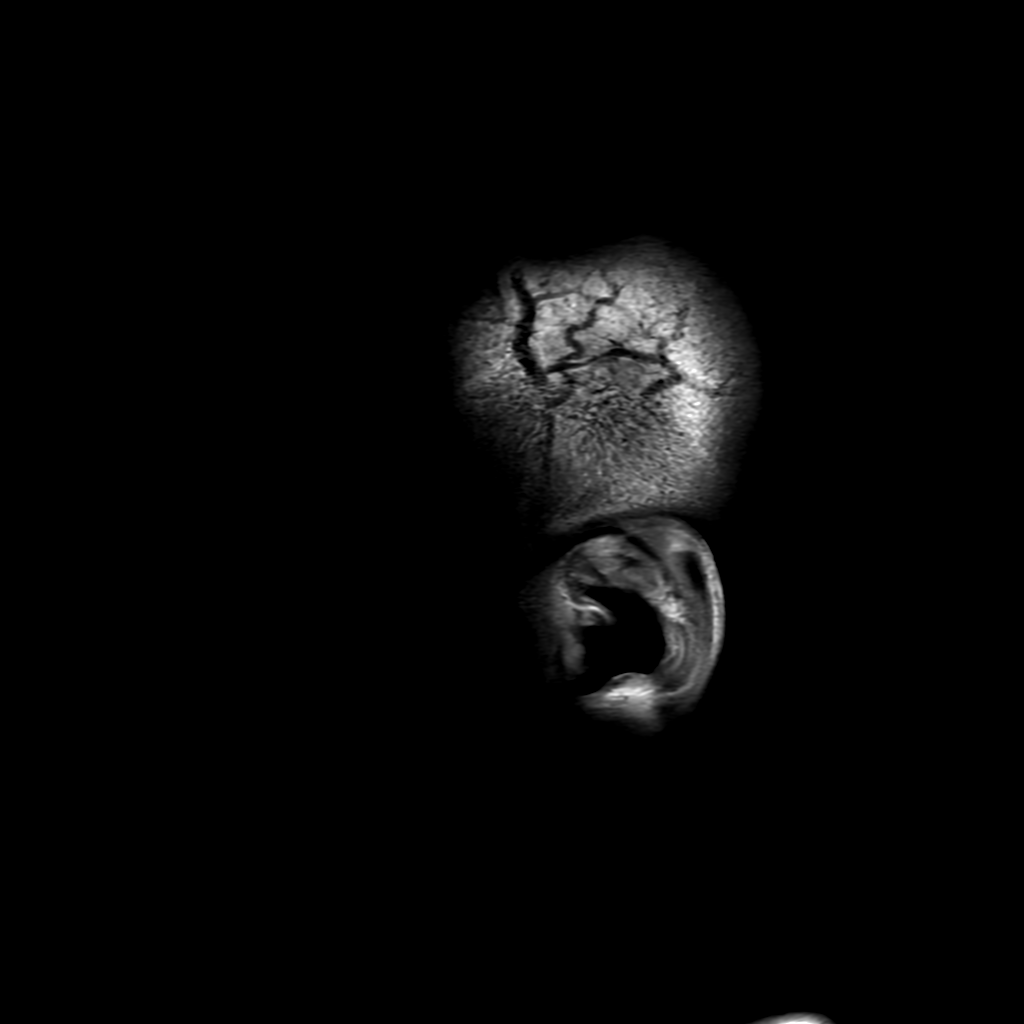
[im 25/25]
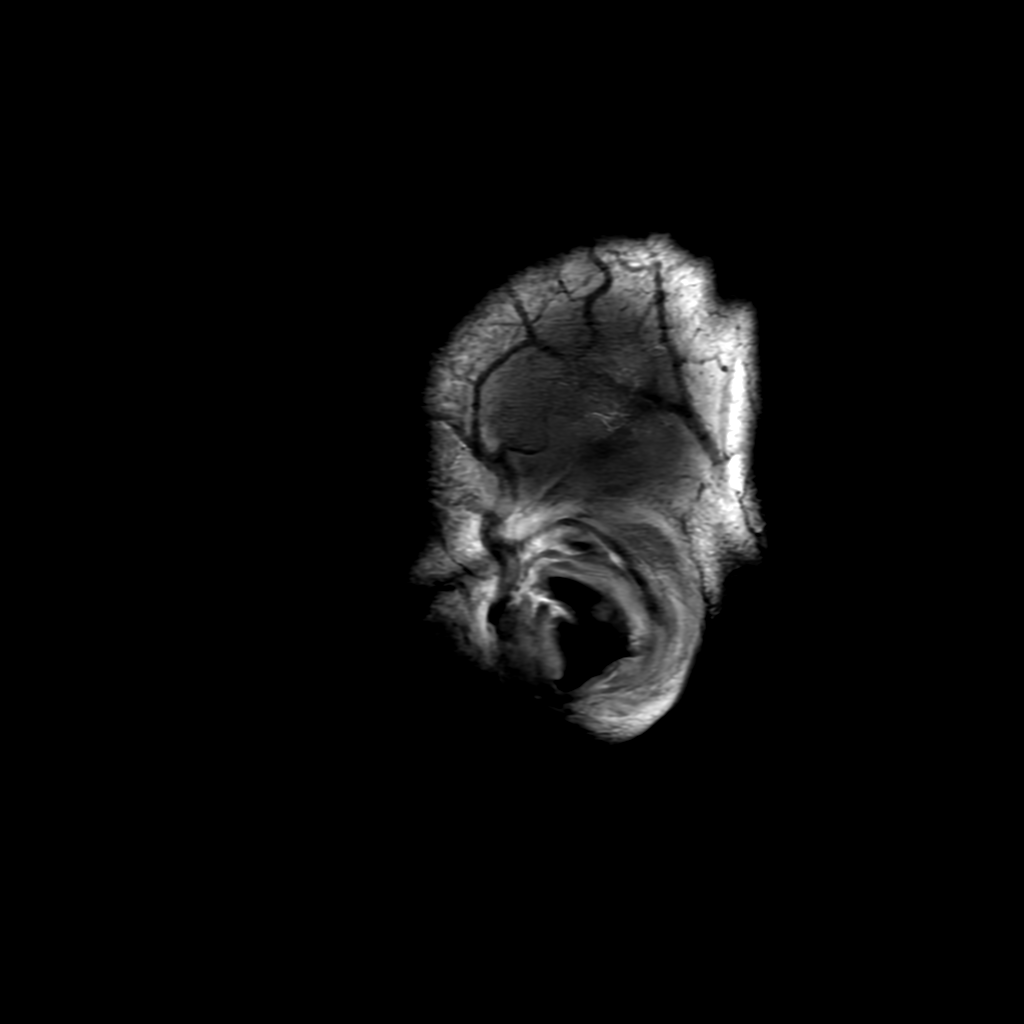

[Series 7: T2 · axial · 5.0mm · 0.21mm/px · 1 of 25 slices shown]
[im 1/25]
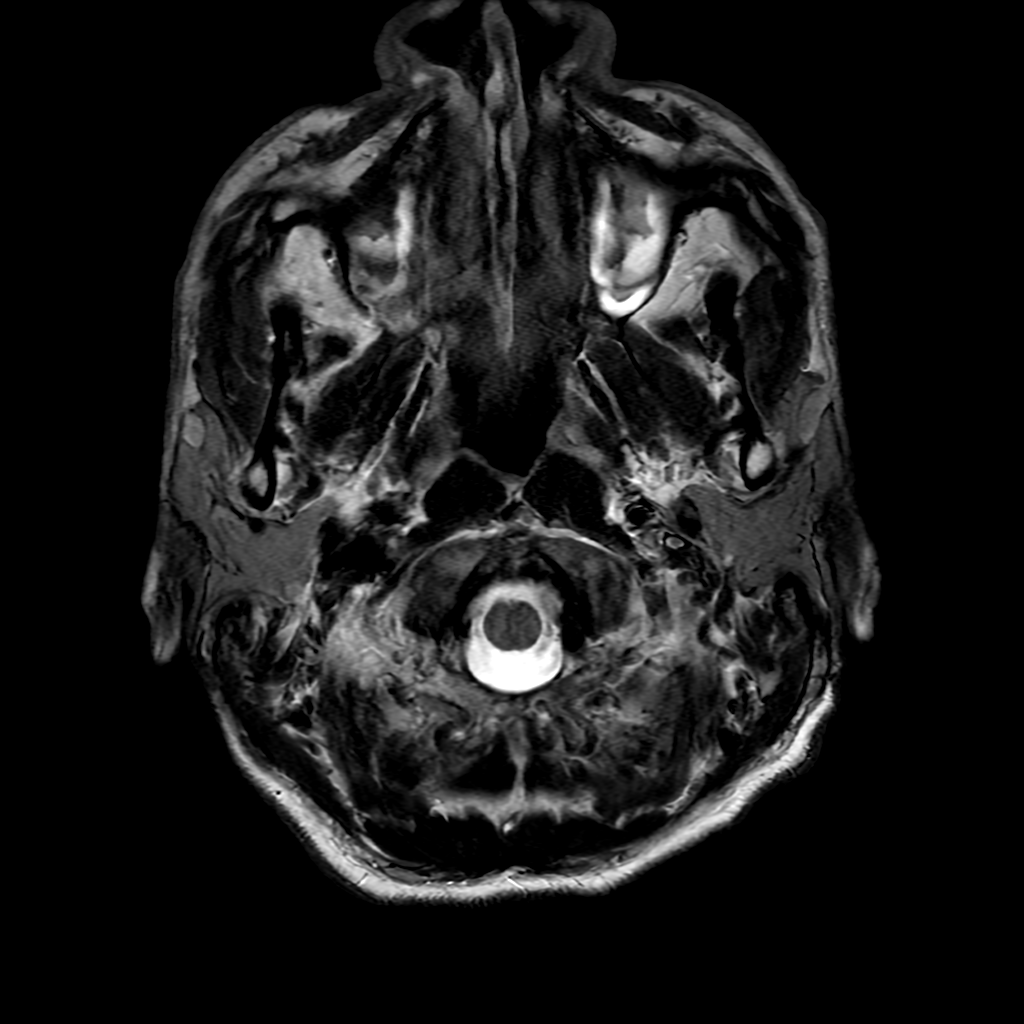

[Series 250: ADC · axial · 3.0mm · 0.94mm/px · z∈[-81,+64]mm · 4 of 51 slices shown (1 of 2)]
[im 1/51]
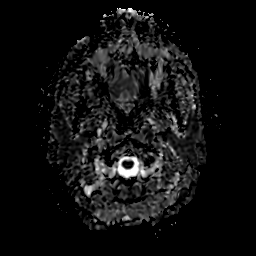
[im 17/51]
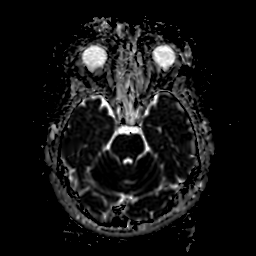
[im 34/51]
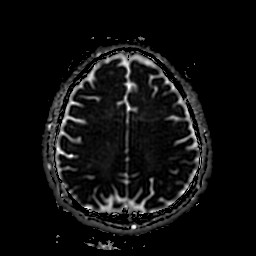
[im 51/51]
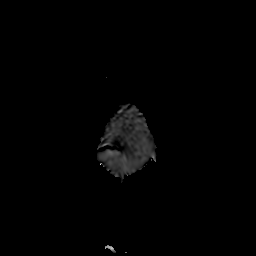

[Series 350: ADC · coronal · 4.0mm · 0.94mm/px · 3 of 37 slices shown (2 of 2)]
[im 1/37]
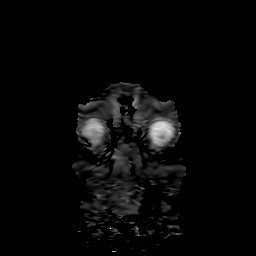
[im 19/37]
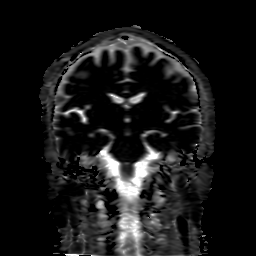
[im 37/37]
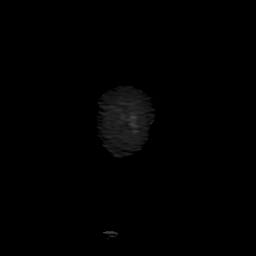

[25 of 48 positions shown; findings below may reference images not displayed]

FINDINGS: Brain: No acute infarct, hemorrhage, or mass lesion is present. The
ventricles are of normal size. No significant extraaxial fluid
collection is present. No significant white matter lesions are
present. The ventricles are of normal size. The brainstem and
cerebellum are within normal limits.

Vascular: Flow is present in the major intracranial arteries.

Skull and upper cervical spine: The craniocervical junction is
normal. Upper cervical spine is within normal limits. Marrow signal
is unremarkable.

Sinuses/Orbits: Mild mucosal thickening is present at the floor of
the maxillary sinuses bilaterally. There is scattered mucosal
thickening throughout the ethmoid air cells and inferior frontal
sinuses. Sphenoid sinuses are clear. The mastoid air cells are
clear. The globes and orbits are within normal limits.
IMPRESSION: 1. Normal MRI appearance of the brain. No acute or focal lesion to
explain the patient's symptoms.
2. Mild diffuse sinus disease.

## 2019-10-17 IMAGING — CR DG SHOULDER 2+V*L*
3 series · 3 of 3 positions shown · non-contrast
Comparison: None.

CLINICAL DATA: Left arm pain for several weeks.  No injury.

EXAM:
LEFT SHOULDER - 2+ VIEW

[shoulder grashey]
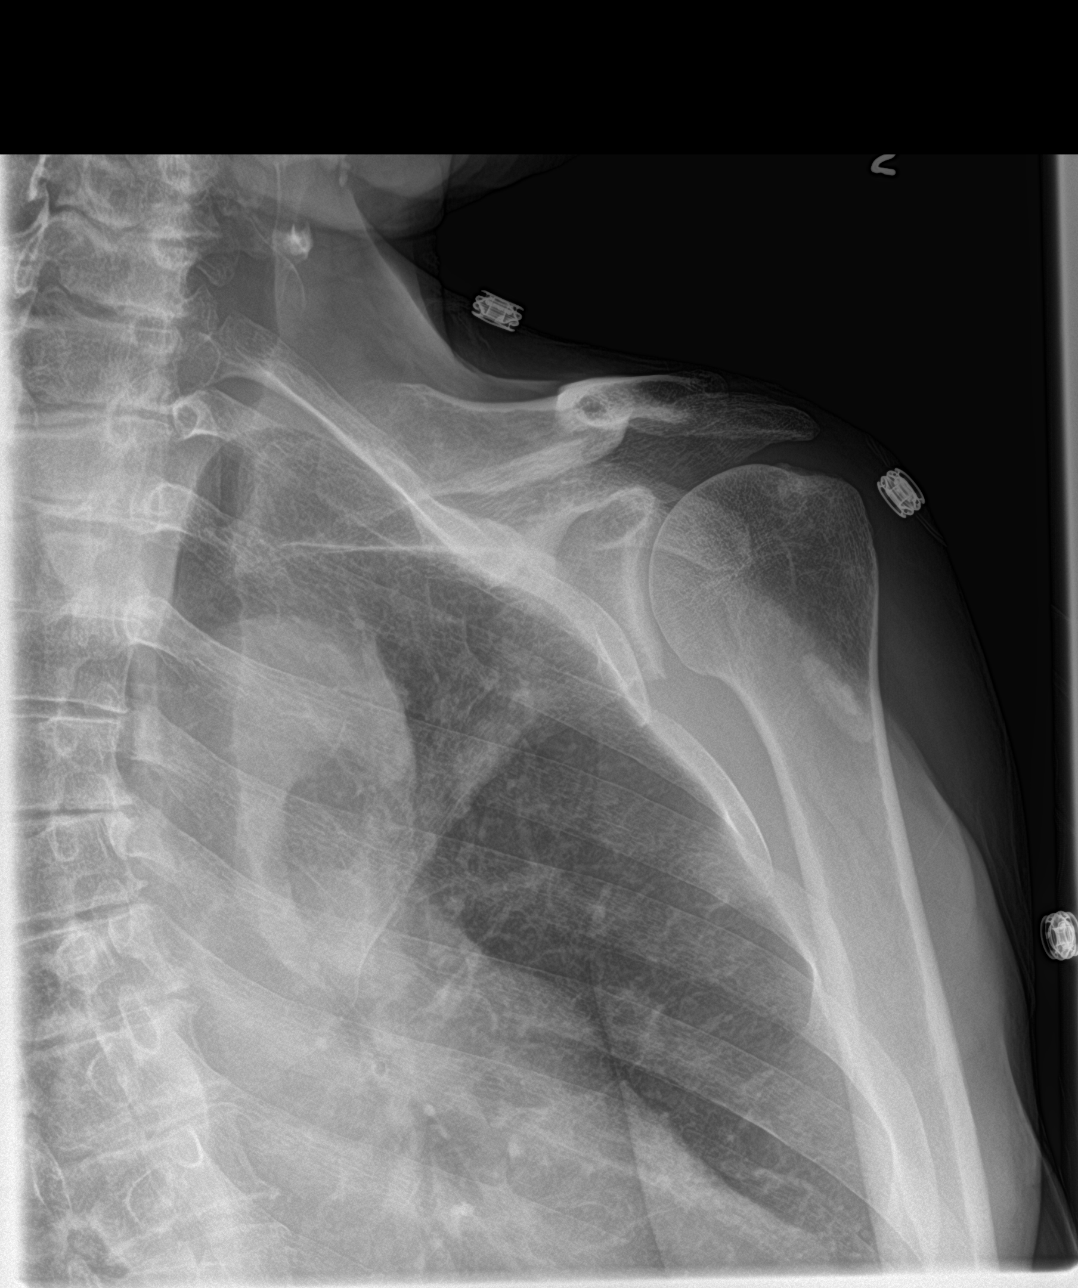

[shoulder y view]
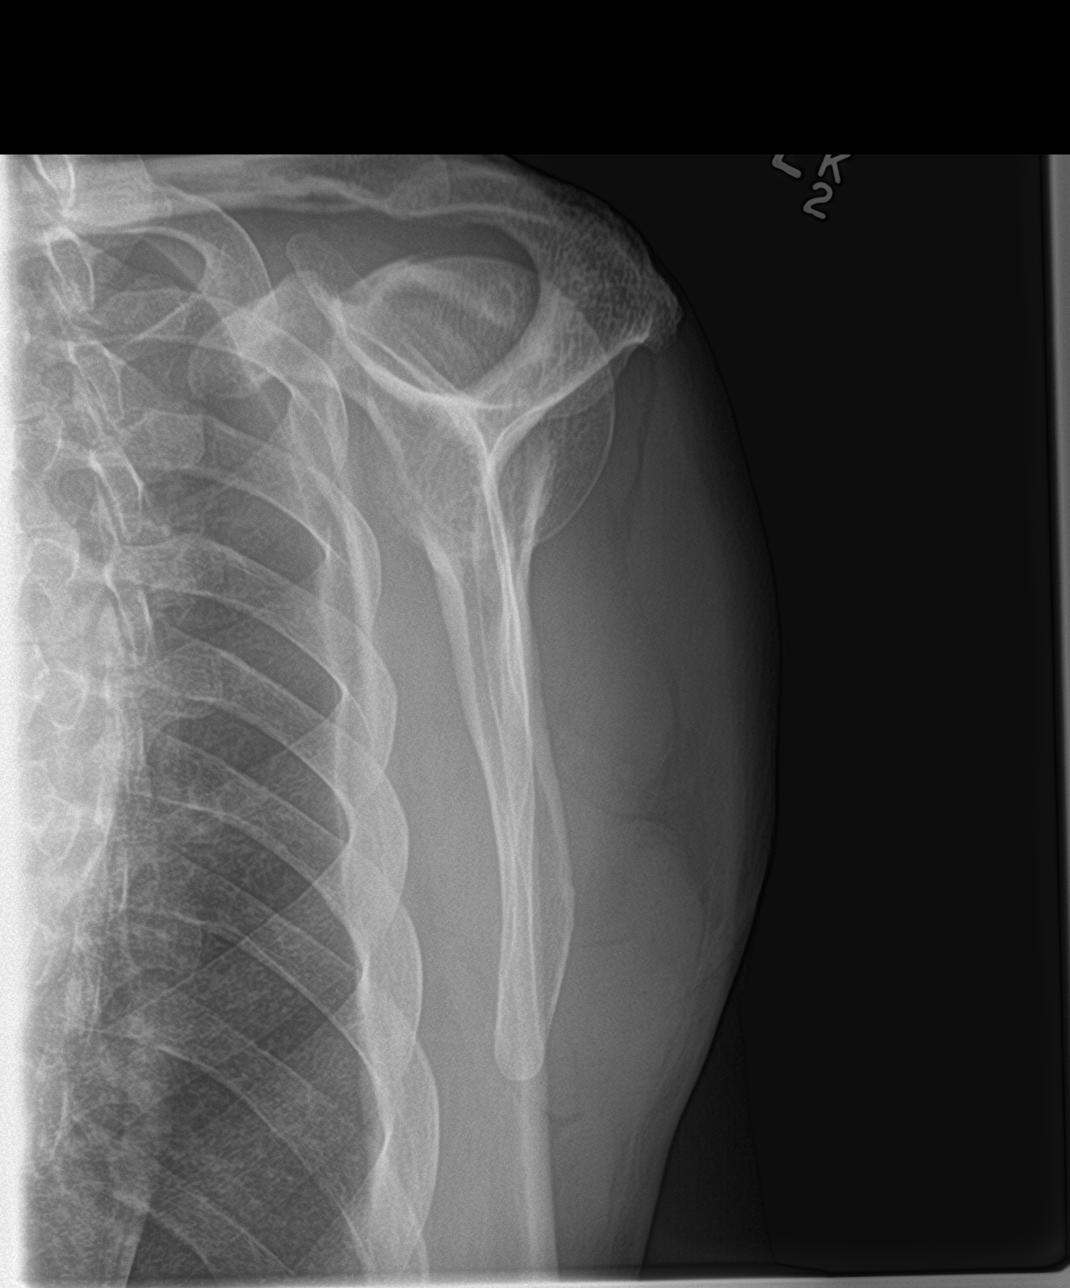

[shoulder axillary]
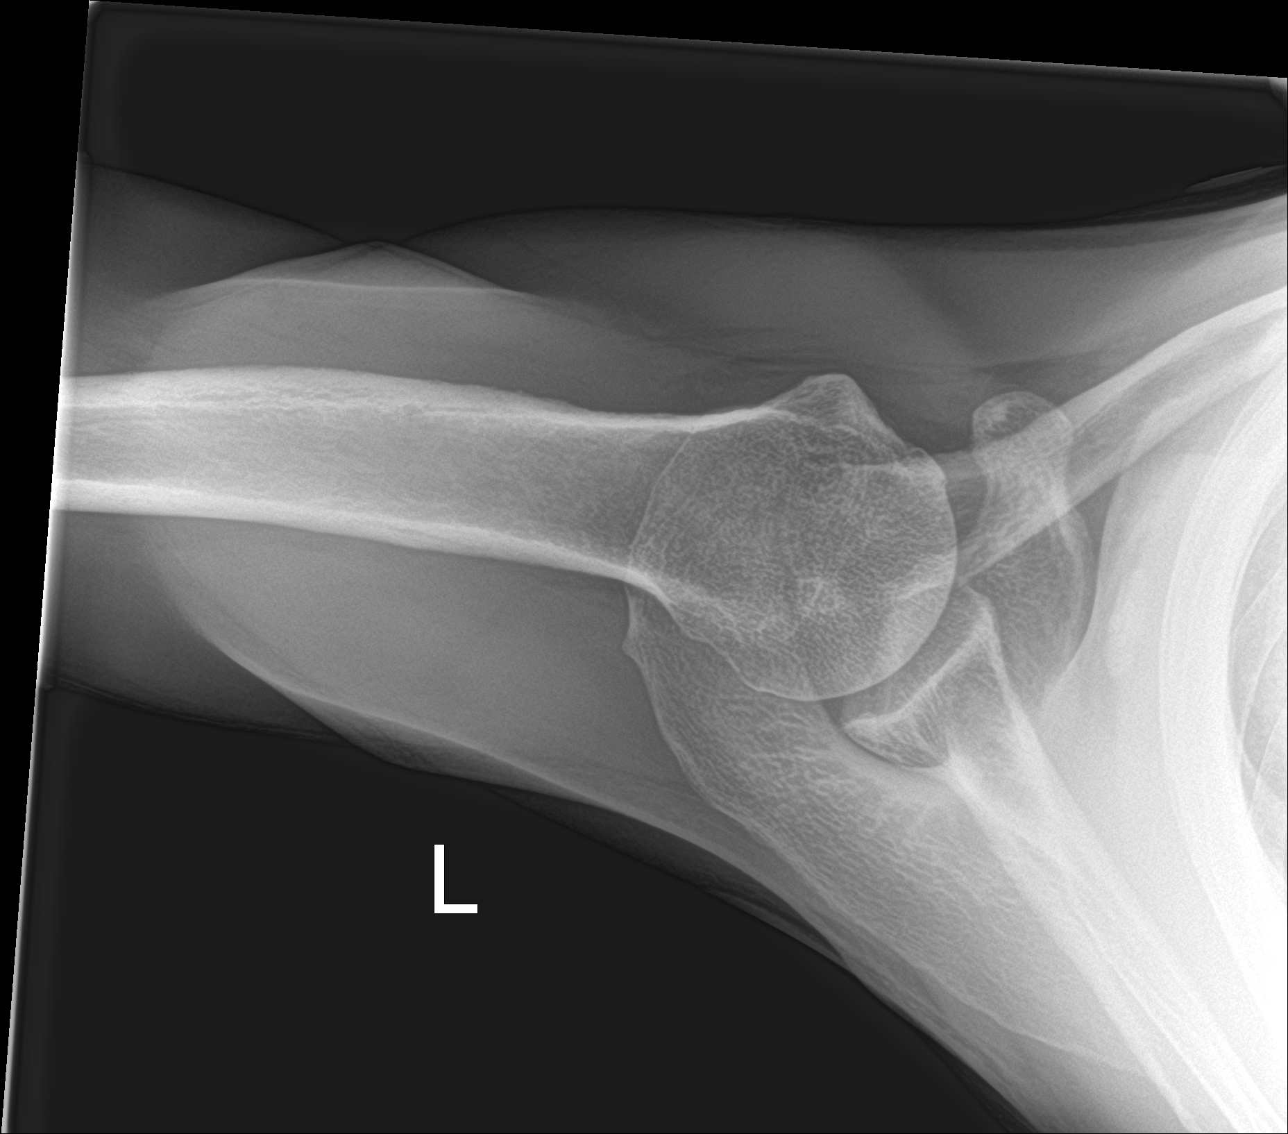

[3 of 3 positions shown; findings below may reference images not displayed]

FINDINGS: There is no evidence of fracture or dislocation. There is no
evidence of arthropathy or other focal bone abnormality. Soft
tissues are unremarkable.
IMPRESSION: Negative.

## 2019-10-17 MED ORDER — MECLIZINE HCL 12.5 MG PO TABS: 12.5000 mg | ORAL_TABLET | Freq: Three times a day (TID) | ORAL | 0 refills | Status: DC | PRN

## 2019-10-17 MED ORDER — SODIUM CHLORIDE 0.9% FLUSH: 3.0000 mL | Freq: Once | INTRAVENOUS | Status: DC

## 2019-10-17 MED ORDER — MECLIZINE HCL 25 MG PO TABS
25.0000 mg | ORAL_TABLET | Freq: Once | ORAL | Status: AC
  Administered 2019-10-17: 25 mg via ORAL
  Filled 2019-10-17: qty 1

## 2019-10-17 NOTE — ED Provider Notes (Signed)
[facility] EMERGENCY DEPARTMENT Provider Note   CSN: [id number] Arrival date & time: 10/17/19  7829     History Chief Complaint  Patient presents with  . Arm Pain  . Gait Problem    Mark Hampton is a 58 y.o. male.  Presents with chief complaint of dizziness, left arm tingling.  Patient reports over the past month he has been having intermittent episodes of dizziness, states worse with sudden movements, improves with resting, lying down.  States he feels somewhat unsteady on his feet.  Is able to walk without assistance.  No associated weakness, no vision changes.  Has had sensation of left arm tingling, starts in upper arm radiates to left hand.  Also has noted some left shoulder pain over the same timeframe.  No injuries, no deformities noted.  No neck pain or back pain.  Denies any chronic medical problems, no prior history of IV drug use, no history of stroke, no anticoagulation.  HPI     History reviewed. No pertinent past medical history.  There are no problems to display for this patient.   History reviewed. No pertinent surgical history.     No family history on file.  Social History   Tobacco Use  . Smoking status: Current Every Day Smoker  . Smokeless tobacco: Never Used  Substance Use Topics  . Alcohol use: Yes  . Drug use: Never    Home Medications Prior to Admission medications   Medication Sig Start Date End Date Taking? Authorizing Provider  cholecalciferol (VITAMIN D3) 25 MCG (1000 UT) tablet Take 1,000 Units by mouth daily.    [provider]  ibuprofen (ADVIL,MOTRIN) 200 MG tablet Take 400 mg by mouth every 6 (six) hours as needed for moderate pain.     [provider]  Lido-Capsaicin-Men-Methyl Sal 0.5-0.035-5-20 % PTCH Apply 1 patch topically daily as needed (pain). 10/20/18   Mesner, Corene Cornea, MD  meclizine (ANTIVERT) 12.5 MG tablet Take 1 tablet (12.5 mg total) by mouth 3 (three) times daily as needed for  dizziness. 10/17/19   Lucrezia Starch, MD  meloxicam (MOBIC) 7.5 MG tablet Take 1 tablet (7.5 mg total) by mouth daily. 10/20/18   Mesner, Corene Cornea, MD  methocarbamol (ROBAXIN) 500 MG tablet Take 1 tablet (500 mg total) by mouth 2 (two) times daily. 10/20/18   Mesner, Corene Cornea, MD  vitamin E (VITAMIN E) 400 UNIT capsule Take 400 Units by mouth daily.    [provider]    Allergies    Patient has no known allergies.  Review of Systems   Review of Systems  Constitutional: Negative for chills and fever.  HENT: Negative for ear pain and sore throat.   Eyes: Negative for pain and visual disturbance.  Respiratory: Negative for cough and shortness of breath.   Cardiovascular: Negative for chest pain and palpitations.  Gastrointestinal: Negative for abdominal pain and vomiting.  Genitourinary: Negative for dysuria and hematuria.  Musculoskeletal: Negative for arthralgias and back pain.  Skin: Negative for color change and rash.  Neurological: Positive for numbness. Negative for seizures and syncope.  All other systems reviewed and are negative.   Physical Exam Updated Vital Signs BP 136/78 (BP Location: Right Arm)   Pulse 76   Temp 98.2 F (36.8 C) (Oral)   Resp 18   SpO2 97%   Physical Exam Vitals and nursing note reviewed.  Constitutional:      Appearance: He is well-developed.  HENT:     Head: Normocephalic and atraumatic.  Eyes:     Conjunctiva/sclera: Conjunctivae normal.  Cardiovascular:     Rate and Rhythm: Normal rate and regular rhythm.     Heart sounds: No murmur.  Pulmonary:     Effort: Pulmonary effort is normal. No respiratory distress.     Breath sounds: Normal breath sounds.  Abdominal:     Palpations: Abdomen is soft.     Tenderness: There is no abdominal tenderness.  Musculoskeletal:     Cervical back: Neck supple.     Comments: No tenderness palpation throughout all 4 extremities, no deformity noted, normal joint range of motion throughout   Skin:    General: Skin is warm and dry.  Neurological:     General: No focal deficit present.     Mental Status: He is alert.     Comments: Alert, oriented x3, cranial nerves II through XII intact, normal finger-nose-finger, normal heel-to-shin, normal gait in ambulation in room, 5 out of 5 strength in bilateral upper and lower extremities, sensation to light touch intact in all 4 extremities  Psychiatric:        Mood and Affect: Mood normal.        Behavior: Behavior normal.     ED Results / Procedures / Treatments   Labs (all labs ordered are listed, but only abnormal results are displayed) Labs Reviewed  CBC - Abnormal; Notable for the following components:      Result Value   RBC 3.99 (*)    MCH 34.6 (*)    All other components within normal limits  BASIC METABOLIC PANEL  TROPONIN I (HIGH SENSITIVITY)    EKG None  Radiology MR BRAIN WO CONTRAST  Result Date: 10/17/2019 CLINICAL DATA:  New onset vertigo. Left upper extremity pain and tingling. Unsteadiness for 2-3 weeks. EXAM: MRI HEAD WITHOUT CONTRAST TECHNIQUE: Multiplanar, multiecho pulse sequences of the brain and surrounding structures were obtained without intravenous contrast. COMPARISON:  None. FINDINGS: Brain: No acute infarct, hemorrhage, or mass lesion is present. The ventricles are of normal size. No significant extraaxial fluid collection is present. No significant white matter lesions are present. The ventricles are of normal size. The brainstem and cerebellum are within normal limits. Vascular: Flow is present in the major intracranial arteries. Skull and upper cervical spine: The craniocervical junction is normal. Upper cervical spine is within normal limits. Marrow signal is unremarkable. Sinuses/Orbits: Mild mucosal thickening is present at the floor of the maxillary sinuses bilaterally. There is scattered mucosal thickening throughout the ethmoid air cells and inferior frontal sinuses. Sphenoid sinuses are  clear. The mastoid air cells are clear. The globes and orbits are within normal limits. IMPRESSION: 1. Normal MRI appearance of the brain. No acute or focal lesion to explain the patient's symptoms. 2. Mild diffuse sinus disease. Electronically Signed   By: Marin Roberts M.D.   On: 10/17/2019 14:42   DG Shoulder Left  Result Date: 10/17/2019 CLINICAL DATA:  Left arm pain for several weeks.  No injury. EXAM: LEFT SHOULDER - 2+ VIEW COMPARISON:  None. FINDINGS: There is no evidence of fracture or dislocation. There is no evidence of arthropathy or other focal bone abnormality. Soft tissues are unremarkable. IMPRESSION: Negative. Electronically Signed   By: Sherian Rein M.D.   On: 10/17/2019 13:22    Procedures Procedures (including critical care time)  Medications Ordered in ED Medications  meclizine (ANTIVERT) tablet 25 mg (25 mg Oral Given 10/17/19 1519)    ED Course  I have reviewed the triage vital signs and  the nursing notes.  Pertinent labs & imaging results that were available during my care of the patient were reviewed by me and considered in my medical decision making (see chart for details).    MDM Rules/Calculators/A&P                      58-year-old male presents to ER with complaints of intermittent dizziness over the past month as well as left arm pain.  On exam patient noted be well-appearing, demonstrated normal neurologic exam, normal gait.  MRI checked to evaluate for central etiology, MRI brain negative.  Suspect BPPV.  Gave Rx for meclizine.  No obvious physical exam abnormality to explain his left shoulder pain.  X-ray was negative.  He has no swelling, no erythema.  Recommend follow-up with primary doctor as well as neurology.  Reviewed precautions, will discharge home at this time.    After the discussed management above, the patient was determined to be safe for discharge.  The patient was in agreement with this plan and all questions regarding their care  were answered.  ED return precautions were discussed and the patient will return to the ED with any significant worsening of condition.   Final Clinical Impression(s) / ED Diagnoses Final diagnoses:  Vertigo    Rx / DC Orders ED Discharge Orders         Ordered    meclizine (ANTIVERT) 12.5 MG tablet  3 times daily PRN     10/17/19 1535           Milagros Lollykstra, Aimee Heldman S, MD 10/17/19 2117

## 2019-10-17 NOTE — Discharge Instructions (Signed)
Recommend taking the prescribed meclizine as needed for further episodes of vertigo.  Recommend following up both with your primary care doctor as well as with a neurologist.  Please see numbers provided for people you can follow-up with.  Ideally should have a recheck within the next week with one of these providers.  If you have worsening dizziness, episodes of passing out, worsening numbness, any weakness or vision changes, please return to ER for reassessment.

## 2019-10-17 NOTE — ED Notes (Signed)
Patient Alert and oriented to baseline. Stable and ambulatory to baseline. Patient verbalized understanding of the discharge instructions.  Patient belongings were taken by the patient.   

## 2019-10-17 NOTE — ED Triage Notes (Signed)
C/o L arm pain/tingling and unsteady on feet for 2-3 weeks.  Denies dizziness but states he feels like he is bumping into things.  No arm drift.

## 2019-10-22 HISTORY — PX: SPINE SURGERY: SHX786

## 2019-11-16 ENCOUNTER — Ambulatory Visit (INDEPENDENT_AMBULATORY_CARE_PROVIDER_SITE_OTHER): Payer: Self-pay | Admitting: Primary Care

## 2019-11-16 ENCOUNTER — Encounter (INDEPENDENT_AMBULATORY_CARE_PROVIDER_SITE_OTHER): Payer: Self-pay | Admitting: Primary Care

## 2019-11-16 ENCOUNTER — Other Ambulatory Visit: Payer: Self-pay

## 2019-11-16 VITALS — BP 124/84 | HR 101 | Temp 97.2°F | Ht 66.0 in | Wt 142.2 lb

## 2019-11-16 DIAGNOSIS — Z09 Encounter for follow-up examination after completed treatment for conditions other than malignant neoplasm: Secondary | ICD-10-CM

## 2019-11-16 DIAGNOSIS — Z0000 Encounter for general adult medical examination without abnormal findings: Secondary | ICD-10-CM

## 2019-11-16 DIAGNOSIS — H8112 Benign paroxysmal vertigo, left ear: Secondary | ICD-10-CM

## 2019-11-16 DIAGNOSIS — Z23 Encounter for immunization: Secondary | ICD-10-CM

## 2019-11-16 NOTE — Patient Instructions (Signed)

## 2019-11-16 NOTE — Progress Notes (Signed)
Established Patient Office Visit  Subjective:  Patient ID: Mark Hampton, male    DOB: Sep 23, 1961  Age: 59 y.o. MRN: [id number]  CC:  Chief Complaint  Patient presents with  . Hospitalization Follow-up    vertigo     HPI Mark Hampton presents for establishment of care and hospital follow up from vertigo treated with antevert 12.5mg  not effective. States vertigo is worse walking unstable gait , left arm limited use feels tingling in abdomen area. Denies headaches but had pain in trapeze area. Denies any incontinence of bowel and bladder.  No past medical history on file.  No past surgical history on file.  No family history on file.  Social History   Socioeconomic History  . Marital status: Married    Spouse name: Not on file  . Number of children: Not on file  . Years of education: Not on file  . Highest education level: Not on file  Occupational History  . Not on file  Tobacco Use  . Smoking status: Current Every Day Smoker  . Smokeless tobacco: Never Used  Substance and Sexual Activity  . Alcohol use: Yes  . Drug use: Never  . Sexual activity: Not on file  Other Topics Concern  . Not on file  Social History Narrative  . Not on file   Social Determinants of Health   Financial Resource Strain:   . Difficulty of Paying Living Expenses: Not on file  Food Insecurity:   . Worried About Charity fundraiser in the Last Year: Not on file  . Ran Out of Food in the Last Year: Not on file  Transportation Needs:   . Lack of Transportation (Medical): Not on file  . Lack of Transportation (Non-Medical): Not on file  Physical Activity:   . Days of Exercise per Week: Not on file  . Minutes of Exercise per Session: Not on file  Stress:   . Feeling of Stress : Not on file  Social Connections:   . Frequency of Communication with Friends and Family: Not on file  . Frequency of Social Gatherings with Friends and Family: Not on file  . Attends Religious Services: Not on file   . Active Member of Clubs or Organizations: Not on file  . Attends Archivist Meetings: Not on file  . Marital Status: Not on file  Intimate Partner Violence:   . Fear of Current or Ex-Partner: Not on file  . Emotionally Abused: Not on file  . Physically Abused: Not on file  . Sexually Abused: Not on file    Outpatient Medications Prior to Visit  Medication Sig Dispense Refill  . meclizine (ANTIVERT) 12.5 MG tablet Take 1 tablet (12.5 mg total) by mouth 3 (three) times daily as needed for dizziness. 30 tablet 0  . cholecalciferol (VITAMIN D3) 25 MCG (1000 UT) tablet Take 1,000 Units by mouth daily.    Marland Kitchen ibuprofen (ADVIL,MOTRIN) 200 MG tablet Take 400 mg by mouth every 6 (six) hours as needed for moderate pain.     . vitamin E (VITAMIN E) 400 UNIT capsule Take 400 Units by mouth daily.    . Lido-Capsaicin-Men-Methyl Sal 0.5-0.035-5-20 % PTCH Apply 1 patch topically daily as needed (pain). 30 patch 0  . meloxicam (MOBIC) 7.5 MG tablet Take 1 tablet (7.5 mg total) by mouth daily. 30 tablet 0  . methocarbamol (ROBAXIN) 500 MG tablet Take 1 tablet (500 mg total) by mouth 2 (two) times daily. 20 tablet 0   No  facility-administered medications prior to visit.    No Known Allergies  ROS Review of Systems  Constitutional: Negative for activity change.  Respiratory: Positive for shortness of breath.        Excertion  Musculoskeletal: Positive for gait problem.  Neurological: Positive for dizziness, weakness and light-headedness.  All other systems reviewed and are negative.     Objective:    Physical Exam  Constitutional: He is oriented to person, place, and time. He appears well-developed and well-nourished.  HENT:  Head: Normocephalic.  Eyes: Pupils are equal, round, and reactive to light.  Cardiovascular: Normal rate and regular rhythm.  Pulmonary/Chest: Effort normal and breath sounds normal.  Abdominal: Bowel sounds are normal.  Musculoskeletal:        General:  Normal range of motion.     Cervical back: Normal range of motion and neck supple.  Neurological: He is oriented to person, place, and time. He has normal reflexes.  Skin: Skin is warm and dry.  Psychiatric: He has a normal mood and affect. His behavior is normal.    BP 124/84 (BP Location: Left Arm, Patient Position: Sitting, Cuff Size: Normal)   Pulse (!) 101   Temp (!) 97.2 F (36.2 C) (Temporal)   Ht 5\' 6"  (1.676 m)   Wt 142 lb 3.2 oz (64.5 kg)   SpO2 95%   BMI 22.95 kg/m  Wt Readings from Last 3 Encounters:  11/16/19 142 lb 3.2 oz (64.5 kg)  10/20/18 140 lb (63.5 kg)  08/23/16 139 lb 4.8 oz (63.2 kg)     Health Maintenance Due  Topic Date Due  . Hepatitis C Screening  [birth year]  . HIV Screening  08/15/1974  . COLONOSCOPY  08/15/2009    There are no preventive care reminders to display for this patient.  No results found for: TSH Lab Results  Component Value Date   WBC 5.9 10/17/2019   HGB 13.8 10/17/2019   HCT 39.7 10/17/2019   MCV 99.5 10/17/2019   PLT 234 10/17/2019   Lab Results  Component Value Date   NA 136 10/17/2019   K 4.2 10/17/2019   CO2 25 10/17/2019   GLUCOSE 93 10/17/2019   BUN 12 10/17/2019   CREATININE 1.01 10/17/2019   BILITOT 0.7 08/23/2016   ALKPHOS 100 08/23/2016   AST 25 08/23/2016   ALT 15 (L) 08/23/2016   PROT 8.4 (H) 08/23/2016   ALBUMIN 3.3 (L) 08/23/2016   CALCIUM 9.6 10/17/2019   ANIONGAP 9 10/17/2019   No results found for: CHOL No results found for: HDL No results found for: LDLCALC No results found for: TRIG No results found for: CHOLHDL No results found for: 10/19/2019    Assessment & Plan:  Mark Hampton was seen today for hospitalization follow-up.  Diagnoses and all orders for this visit:  Need for Tdap vaccination Tdap is recommended every 10 years for adults weekly or primary she gets tetanus..  At least 1 of those doses should be with Tdap in adults age 74 and older who have previously received Tdap.  Recommend by  the CDC. -     Tdap vaccine greater than or equal to 7yo IM  Need for immunization against influenza CDC recommends influenza vaccine yearly.  Flu intercanthus respiratory illness caused by influenza virus that affects the nose throat and sometimes the lungs.  Experts believe that liver spread managed by tract was made mainly by tiny droplets made when people with the flu cough sneeze or talk. -  Flu Vaccine QUAD 36+ mos IM  Encounter for medical examination to establish care Gwinda Passe, NP-C will be your  (PCP) mastered prepared that is able to that will  diagnosed and treatment able to answer health concern as well as continuing care of varied medical conditions, not limited by cause, organ system, or diagnosis.   Hospital discharge follow-up 10/17/2019 presents to ER with complaints of intermittent dizziness over the past month as well as left arm pain. MRI brain negative and left shoulder pain-  X-ray was negative.    Benign paroxysmal positional vertigo of left ear -     Ambulatory referral to Neurology   No orders of the defined types were placed in this encounter.   Follow-up: Return if symptoms worsen or fail to improve.    Grayce Sessions, NP

## 2019-11-24 ENCOUNTER — Ambulatory Visit: Payer: Self-pay | Admitting: Neurology

## 2019-11-24 ENCOUNTER — Telehealth: Payer: Self-pay | Admitting: *Deleted

## 2019-11-24 NOTE — Telephone Encounter (Signed)
No showed new patient appointment. 

## 2019-11-26 ENCOUNTER — Encounter: Payer: Self-pay | Admitting: Neurology

## 2019-12-08 ENCOUNTER — Emergency Department (HOSPITAL_COMMUNITY)
Admission: EM | Admit: 2019-12-08 | Discharge: 2019-12-08 | Disposition: A | Discharge status: Home / Self Care | Payer: Self-pay | Attending: Emergency Medicine | Admitting: Emergency Medicine

## 2019-12-08 ENCOUNTER — Other Ambulatory Visit: Payer: Self-pay

## 2019-12-08 ENCOUNTER — Emergency Department (HOSPITAL_COMMUNITY): Payer: Self-pay

## 2019-12-08 DIAGNOSIS — Z79899 Other long term (current) drug therapy: Secondary | ICD-10-CM | POA: Insufficient documentation

## 2019-12-08 DIAGNOSIS — F17210 Nicotine dependence, cigarettes, uncomplicated: Secondary | ICD-10-CM | POA: Insufficient documentation

## 2019-12-08 DIAGNOSIS — M1990 Unspecified osteoarthritis, unspecified site: Secondary | ICD-10-CM | POA: Insufficient documentation

## 2019-12-08 DIAGNOSIS — M2550 Pain in unspecified joint: Secondary | ICD-10-CM

## 2019-12-08 IMAGING — DX DG FOREARM 2V*L*
2 series · 2 of 2 positions shown · non-contrast
Comparison: None.

CLINICAL DATA: Left upper arm pain. Left lower arm pain. Left hand
pain. No recent trauma.

EXAM:
LEFT FOREARM - 2 VIEW

[x forearm ap left]
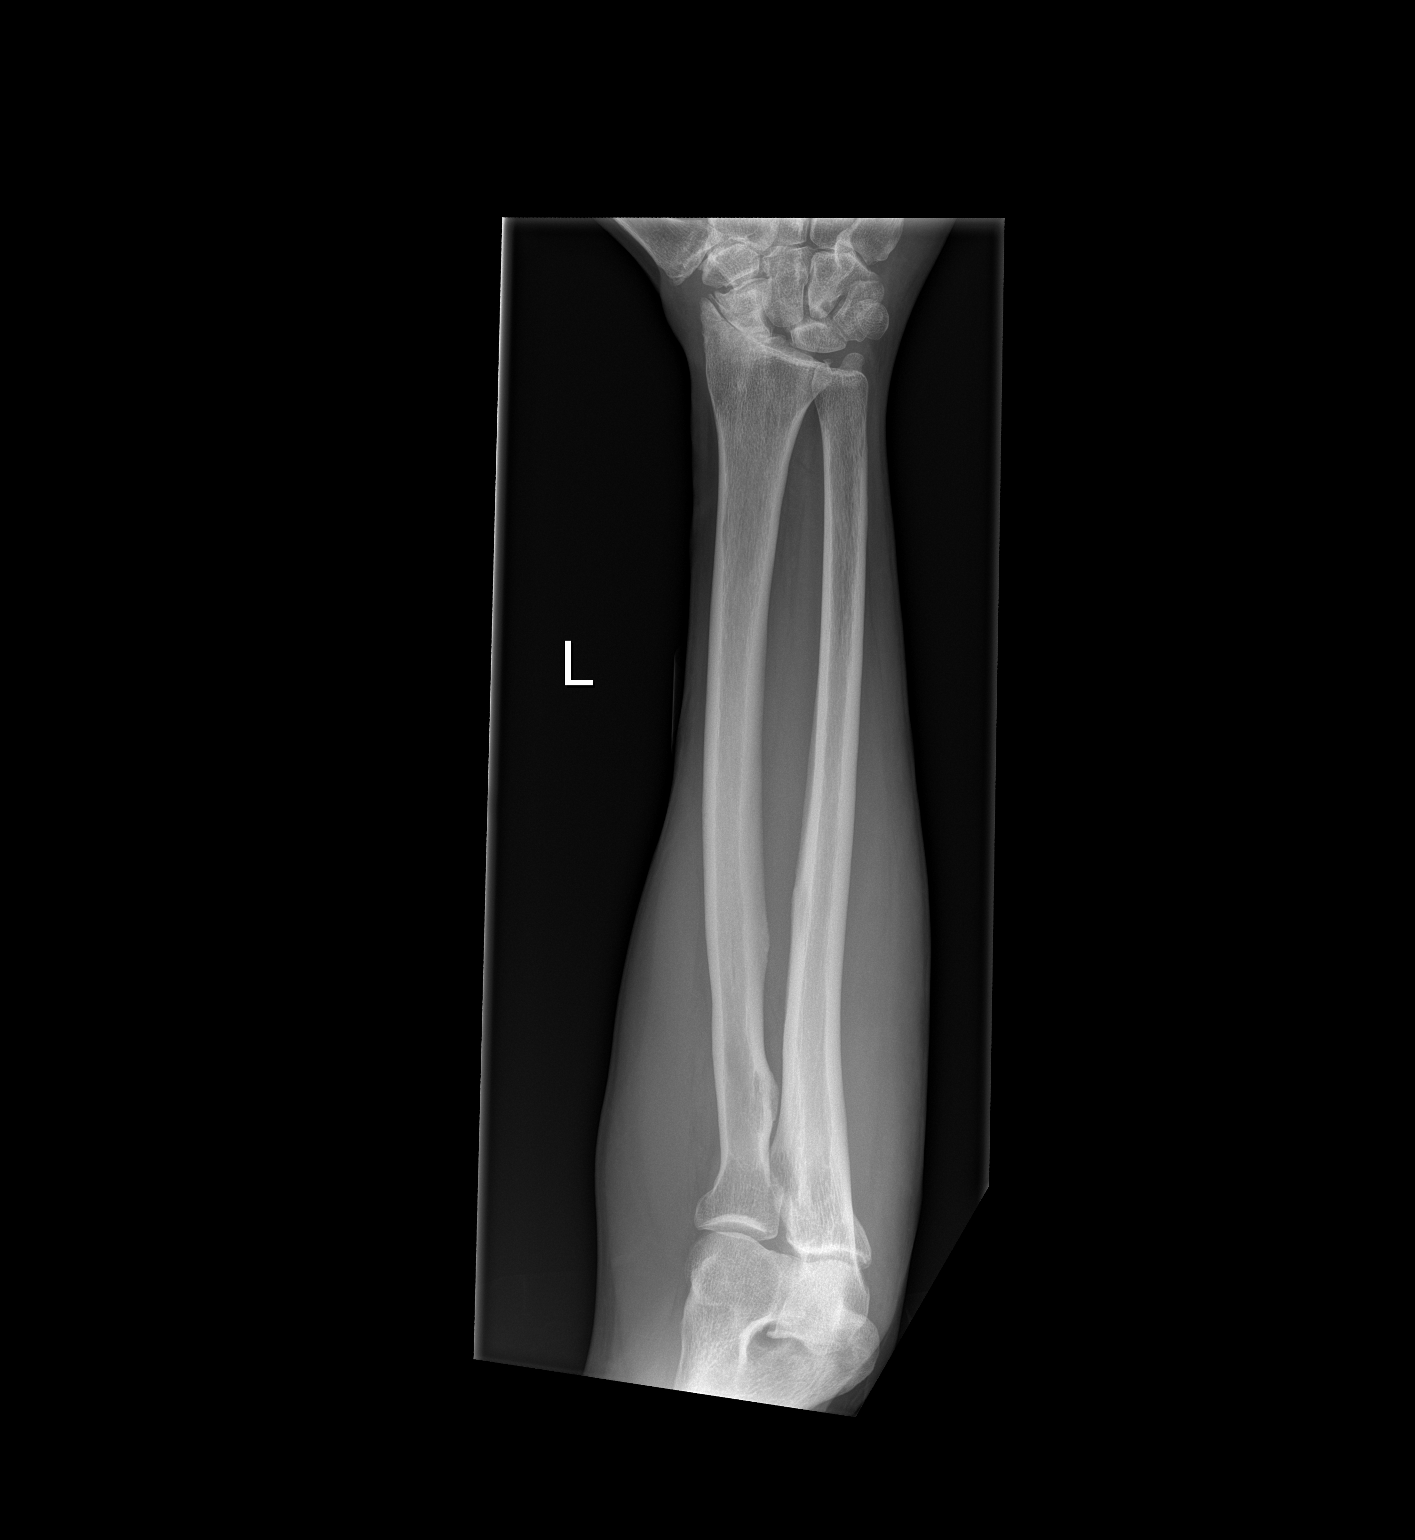

[x forearm lat left]
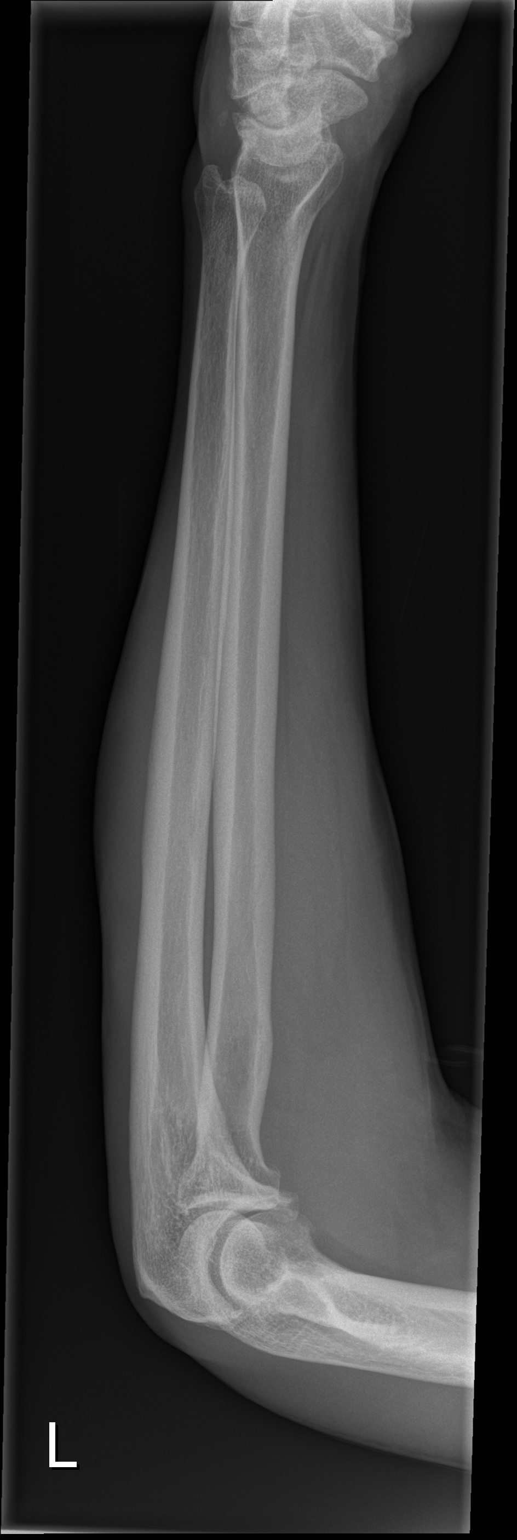

[2 of 2 positions shown; findings below may reference images not displayed]

FINDINGS: Advanced osteoarthritic change at the radioscaphoid joint with
scapholunate interval widening described on hand radiograph. There
is no acute fracture or malalignment. Negative for elbow joint
effusion. No opaque foreign body or gas.
IMPRESSION: 1. No acute finding.
2. Advanced radioscaphoid osteoarthritis with scapholunate interval
widening.

## 2019-12-08 IMAGING — DX DG HAND COMPLETE 3+V*L*
3 series · 3 of 3 positions shown · non-contrast
Comparison: None.

CLINICAL DATA: Numbness and tingling for the past month. No known
injury

EXAM:
LEFT HAND - COMPLETE 3+ VIEW

[x hand pa left]
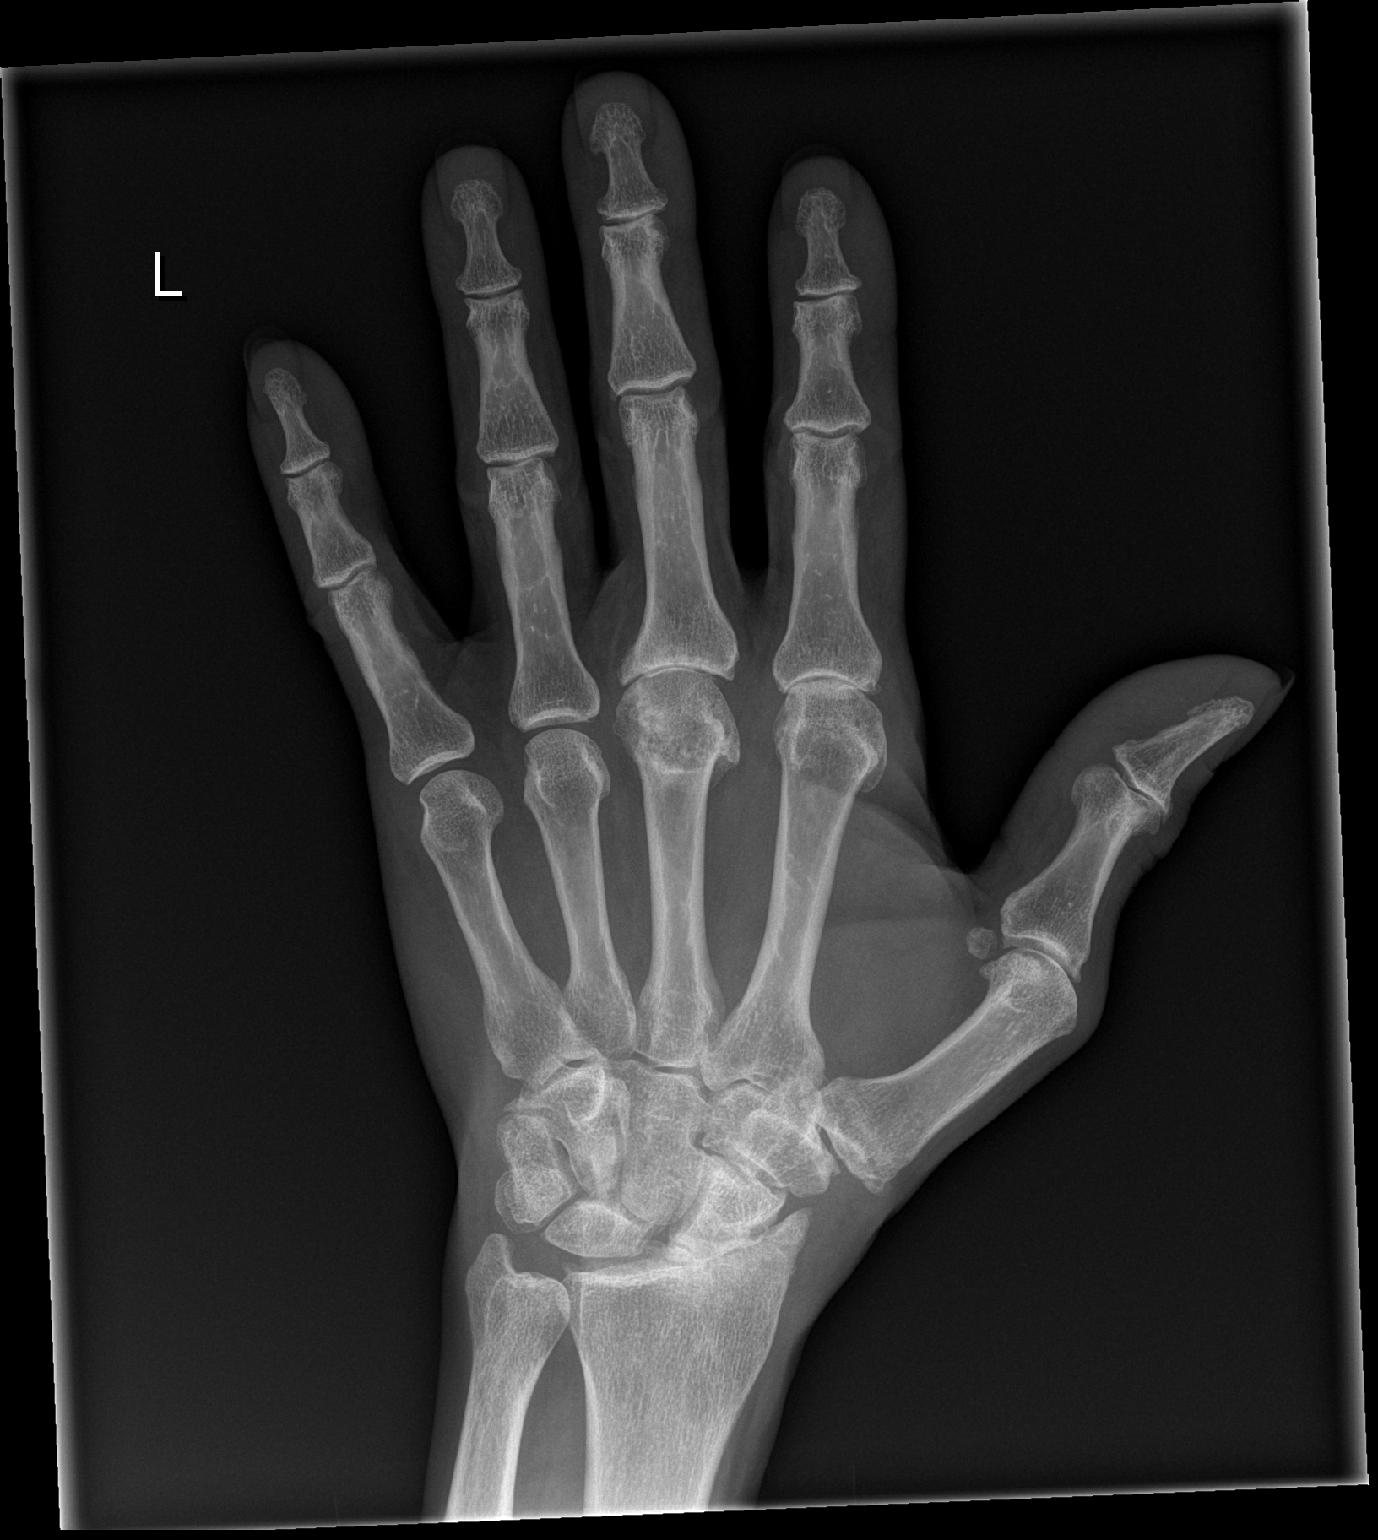

[x hand obl left]
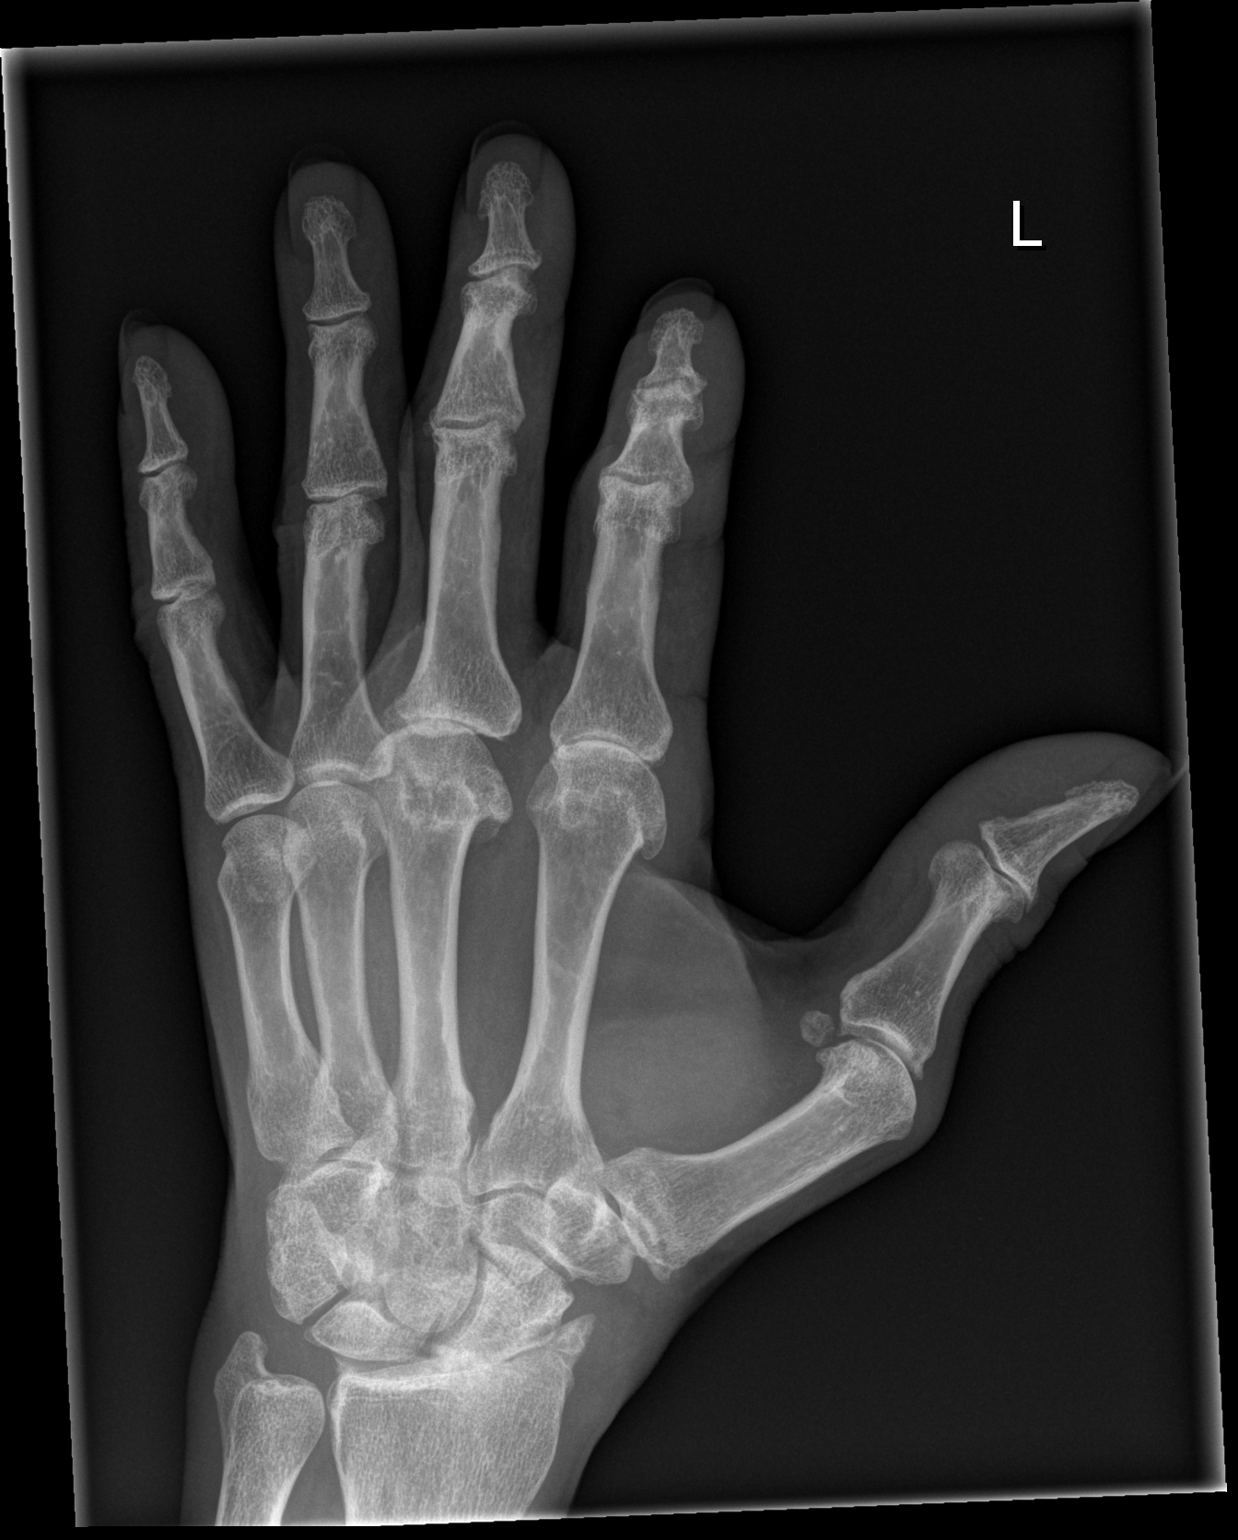

[x hand lat left]
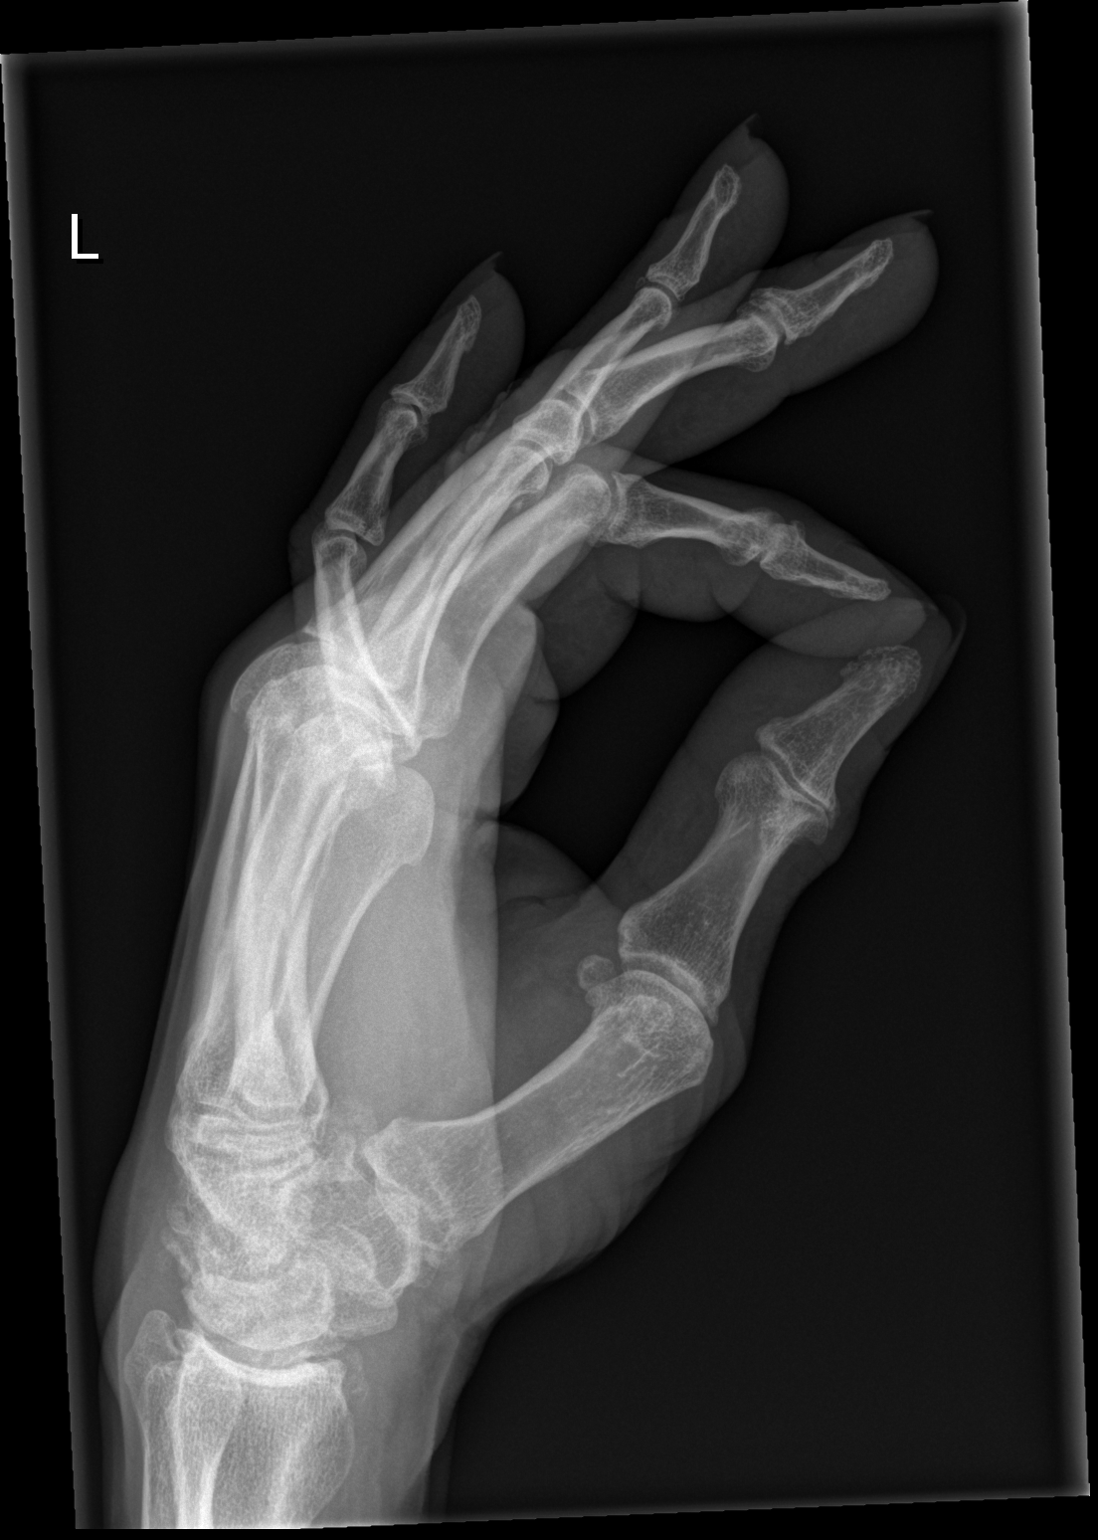

[3 of 3 positions shown; findings below may reference images not displayed]

FINDINGS: No acute fracture or dislocation. Interphalangeal degenerative
spurring. Advanced second and third MCP joint narrowing with
spurring. There is radioscaphoid joint narrowing and spurring with
proximal pole scaphoid sclerosis. The scapholunate interval is wide
at 4 mm.
IMPRESSION: 1. Advanced osteoarthritic change at the second and third MCP
joints. Hemochromatosis, calcium pyrophosphate deposition disease,
and psoriatic arthritis can have this pattern.
2. Radioscaphoid osteoarthritis which could be posttraumatic as
there is scapholunate interval widening and sclerosis/collapse of
the proximal pole scaphoid (as seen with chronic osteonecrosis).
3. Interphalangeal osteoarthritis.

## 2019-12-08 IMAGING — DX DG HUMERUS 2V *L*
2 series · 2 of 2 positions shown · non-contrast
Comparison: [DATE]

CLINICAL DATA: Left upper arm pain.

EXAM:
LEFT HUMERUS - 2+ VIEW

[x humerus ap left]
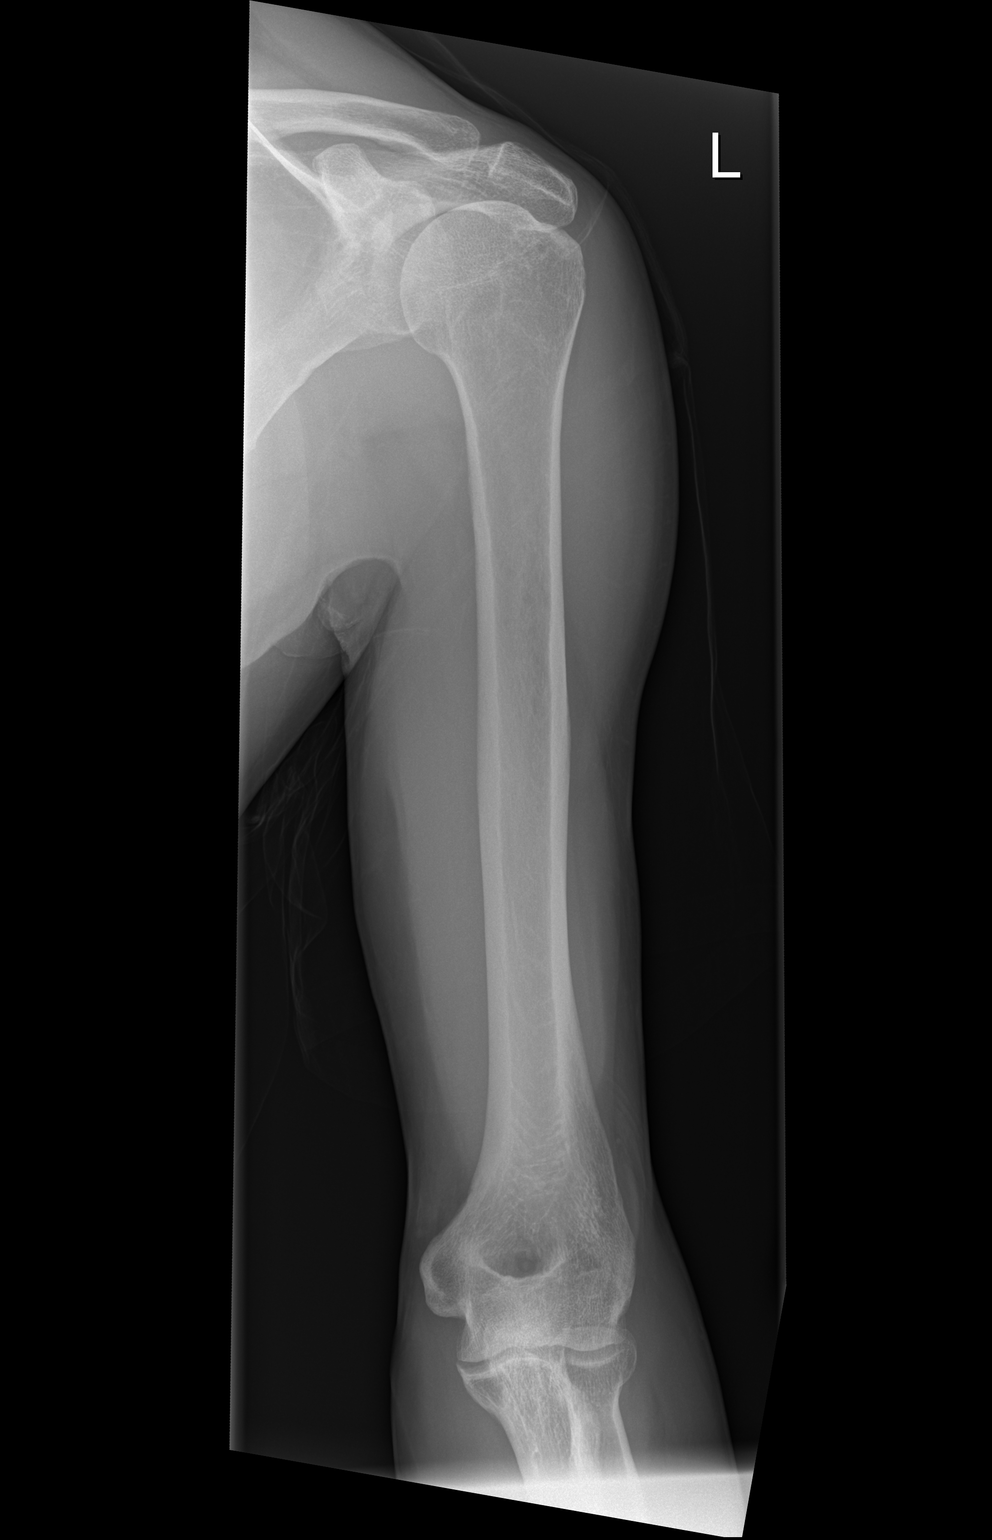

[x humerus lat left]
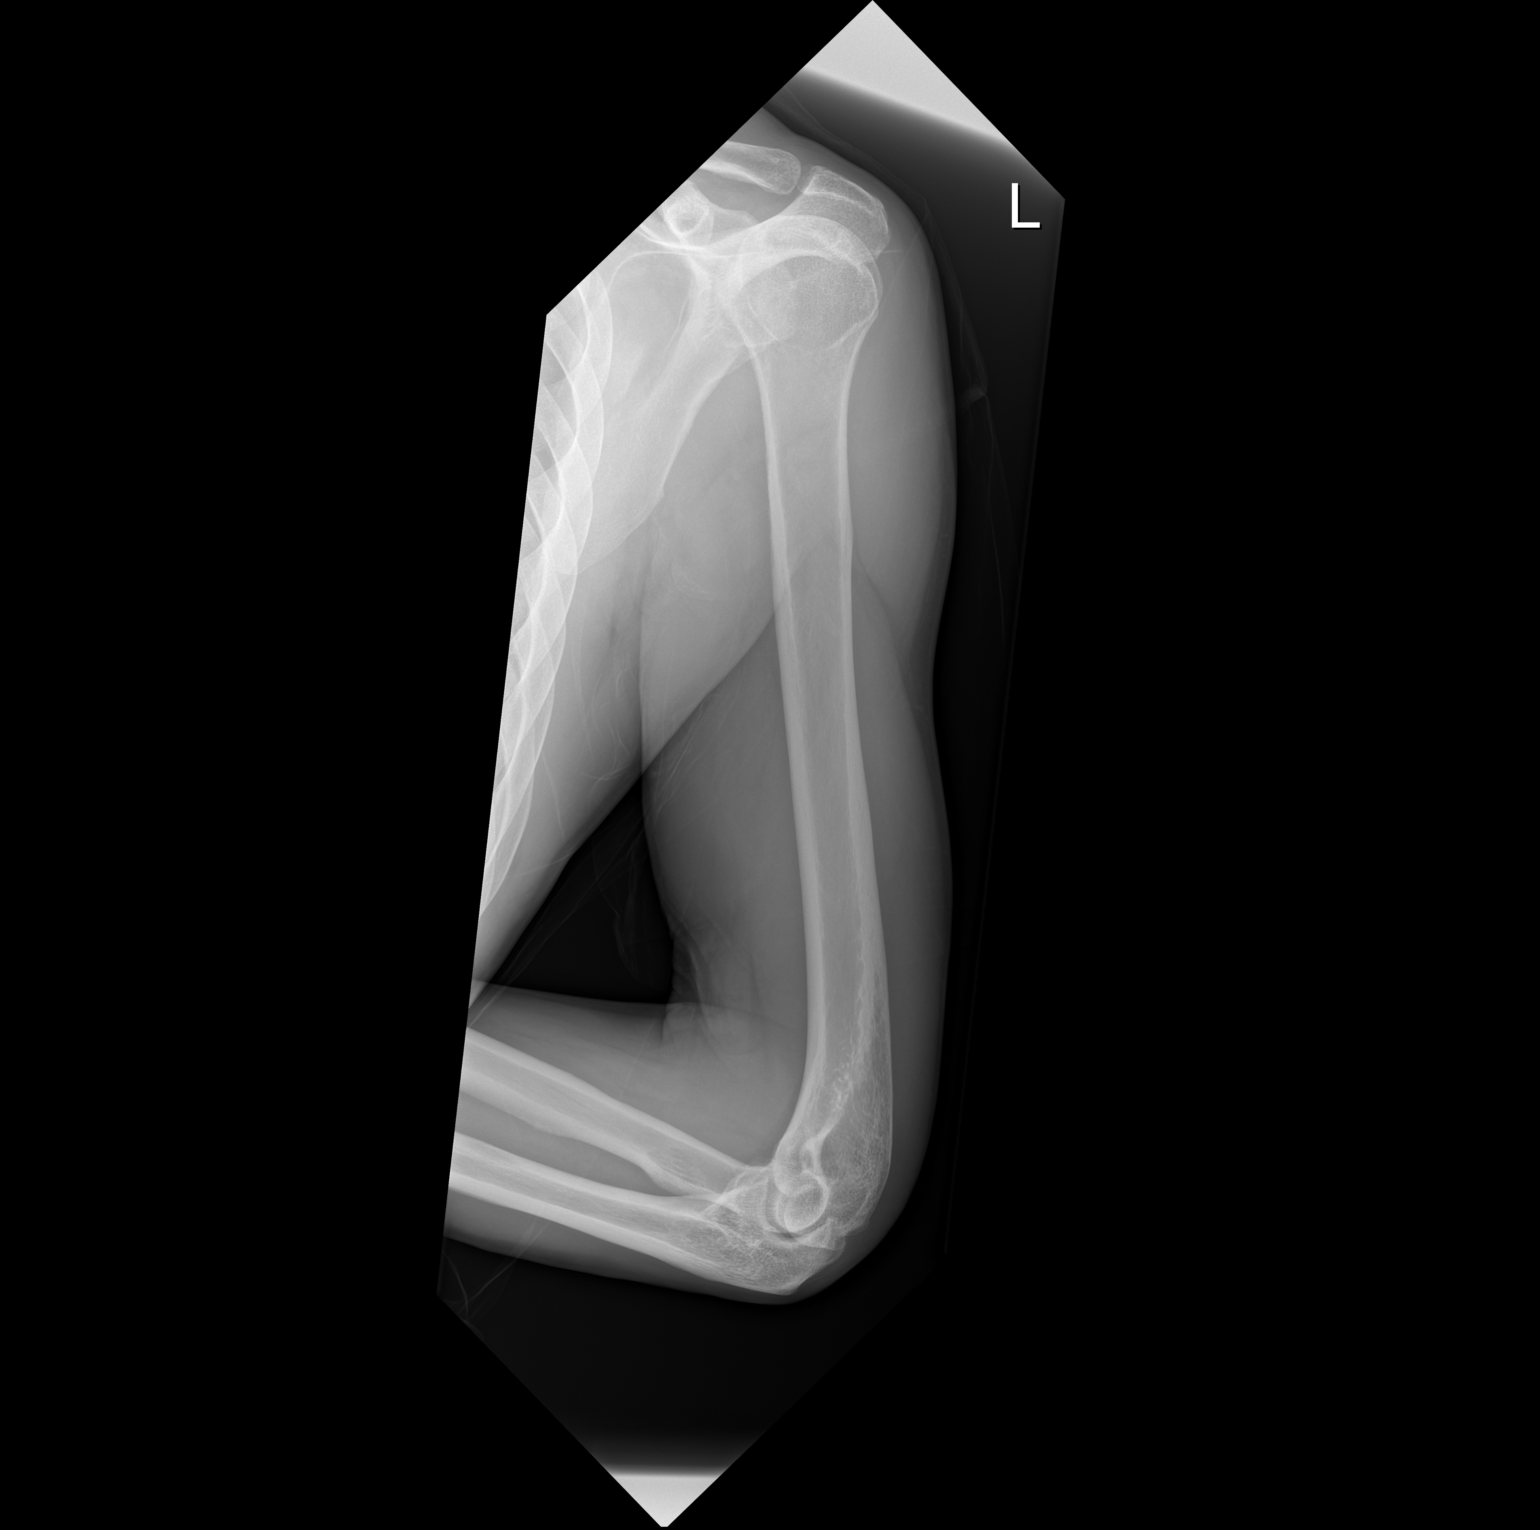

[2 of 2 positions shown; findings below may reference images not displayed]

FINDINGS: There is no evidence of fracture or other focal bone lesions. Soft
tissues are unremarkable.
IMPRESSION: Negative.

## 2019-12-08 MED ORDER — MELOXICAM 7.5 MG PO TABS: 7.5000 mg | ORAL_TABLET | Freq: Every day | ORAL | 0 refills | Status: DC

## 2019-12-08 MED ORDER — IBUPROFEN 400 MG PO TABS
600.0000 mg | ORAL_TABLET | Freq: Once | ORAL | Status: AC
  Administered 2019-12-08: 600 mg via ORAL
  Filled 2019-12-08: qty 1

## 2019-12-08 NOTE — Discharge Planning (Signed)
EDCM consulted to assist uninsured pt with neurology consult.  EDCM contacted [facility] to see if pt has completed orange card and [facility] applications.  Receptionist informed me that his appointment to apply for  financial assistance is 3/17.

## 2019-12-08 NOTE — ED Triage Notes (Signed)
Pt here for evaluation of joint pain in arms, legs, and hips x 1 month. Ambulatory. Sts rx he was given here has not helped.

## 2019-12-08 NOTE — ED Notes (Signed)
Pt dc'd home w/all belongings, a/o x4 pt refused a wheel chair

## 2019-12-08 NOTE — ED Provider Notes (Signed)
[facility] EMERGENCY DEPARTMENT Provider Note   CSN: [id number] Arrival date & time: 12/08/19  0240     History Chief Complaint  Patient presents with  . Joint Pain    Mark Hampton is a 59 y.o. male.  Presents to ER with multiple complaints.  States that for the past 2 or 62months he has been having increased joint pains in his left extremity.  Specifically has pain in his shoulder, elbow, wrist and hand.  Has also had occasional intermittent tingling sensation in his hand.  No associated chest pain or difficulty breathing, no fever, no falls, no neck or back pain.  States he went to his primary care doctor and they referred him to neurology given the tingling sensation.  He did not go to his appointment due to concern for cost.  Has tried Tylenol without any relief for his symptoms.  No swelling, no redness to arm.  Patient last seen in ER in December, vertigo, treated with meclizine, patient reports no ongoing issues with vertigo.  HPI     No past medical history on file.  There are no problems to display for this patient.   No past surgical history on file.     No family history on file.  Social History   Tobacco Use  . Smoking status: Current Every Day Smoker  . Smokeless tobacco: Never Used  Substance Use Topics  . Alcohol use: Yes  . Drug use: Never    Home Medications Prior to Admission medications   Medication Sig Start Date End Date Taking? Authorizing Provider  cholecalciferol (VITAMIN D3) 25 MCG (1000 UT) tablet Take 1,000 Units by mouth daily.    [provider]  ibuprofen (ADVIL,MOTRIN) 200 MG tablet Take 400 mg by mouth every 6 (six) hours as needed for moderate pain.     [provider]  meclizine (ANTIVERT) 12.5 MG tablet Take 1 tablet (12.5 mg total) by mouth 3 (three) times daily as needed for dizziness. 10/17/19   Lucrezia Starch, MD  meloxicam (MOBIC) 7.5 MG tablet Take 1 tablet (7.5 mg total) by mouth daily.  12/08/19   Lucrezia Starch, MD  vitamin E (VITAMIN E) 400 UNIT capsule Take 400 Units by mouth daily.    [provider]    Allergies    Patient has no known allergies.  Review of Systems   Review of Systems  Constitutional: Negative for chills and fever.  HENT: Negative for ear pain and sore throat.   Eyes: Negative for pain and visual disturbance.  Respiratory: Negative for cough and shortness of breath.   Cardiovascular: Negative for chest pain and palpitations.  Gastrointestinal: Negative for abdominal pain and vomiting.  Genitourinary: Negative for dysuria and hematuria.  Musculoskeletal: Positive for arthralgias. Negative for back pain.  Skin: Negative for color change and rash.  Neurological: Negative for seizures and syncope.  All other systems reviewed and are negative.   Physical Exam Updated Vital Signs BP 115/85 (BP Location: Right Arm)   Pulse 68   Temp 97.6 F (36.4 C) (Oral)   Resp 14   SpO2 100%   Physical Exam Vitals and nursing note reviewed.  Constitutional:      Appearance: He is well-developed.  HENT:     Head: Normocephalic and atraumatic.  Eyes:     Conjunctiva/sclera: Conjunctivae normal.  Cardiovascular:     Rate and Rhythm: Normal rate and regular rhythm.     Heart sounds: No murmur.  Pulmonary:  Effort: Pulmonary effort is normal. No respiratory distress.     Breath sounds: Normal breath sounds.  Abdominal:     Palpations: Abdomen is soft.     Tenderness: There is no abdominal tenderness.  Musculoskeletal:     Cervical back: Neck supple.     Comments: LUE: Generalized TTP throughout LUE, no focal bony pain, no swelling, no deformity, normal joint ROM, sensation to light touch intact throughout, strength 5/5 throughout extremity  Skin:    General: Skin is warm and dry.  Neurological:     Mental Status: He is alert.     ED Results / Procedures / Treatments   Labs (all labs ordered are listed, but only abnormal results  are displayed) Labs Reviewed - No data to display  EKG None  Radiology DG Forearm Left  Result Date: 12/08/2019 CLINICAL DATA:  Left upper arm pain. Left lower arm pain. Left hand pain. No recent trauma. EXAM: LEFT FOREARM - 2 VIEW COMPARISON:  None. FINDINGS: Advanced osteoarthritic change at the radioscaphoid joint with scapholunate interval widening described on hand radiograph. There is no acute fracture or malalignment. Negative for elbow joint effusion. No opaque foreign body or gas. IMPRESSION: 1. No acute finding. 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 Electronically Signed   By: Marnee Spring M.D.   On: 12/08/2019 09:13   DG Humerus Left  Result Date: 12/08/2019 CLINICAL DATA:  Left upper arm pain. EXAM: LEFT HUMERUS - 2+ VIEW COMPARISON:  10/17/2019 FINDINGS: There is no evidence of fracture or other focal bone lesions. Soft tissues are unremarkable. IMPRESSION: Negative. Electronically Signed   By: Marnee Spring M.D.   On: 12/08/2019 09:08   DG Hand Complete Left  Result Date: 12/08/2019 CLINICAL DATA:  Numbness and tingling for the past month. No known injury EXAM: LEFT HAND - COMPLETE 3+ VIEW COMPARISON:  None. FINDINGS: No acute fracture or dislocation. Interphalangeal degenerative spurring. Advanced second and third MCP joint narrowing with spurring. There is radioscaphoid joint narrowing and spurring with proximal pole scaphoid sclerosis. The scapholunate interval is wide at 4 mm. IMPRESSION: 1. Advanced osteoarthritic change at the second and third MCP joints. Hemochromatosis, calcium pyrophosphate deposition disease, and psoriatic arthritis can have this pattern. 2. Radioscaphoid osteoarthritis which could be posttraumatic as there is scapholunate interval widening and sclerosis/collapse of the proximal pole scaphoid (as seen with chronic osteonecrosis). 3. Interphalangeal osteoarthritis. Electronically Signed   By: Marnee Spring M.D.    On: 12/08/2019 09:11    Procedures Procedures (including critical care time)  Medications Ordered in ED Medications  ibuprofen (ADVIL) tablet 600 mg (600 mg Oral Given 12/08/19 0901)    ED Course  I have reviewed the triage vital signs and the nursing notes.  Pertinent labs & imaging results that were available during my care of the patient were reviewed by me and considered in my medical decision making (see chart for details).    MDM Rules/Calculators/A&P                      82-year-old male presenting to ER with complaints of left arm joint pain, hand tingling.  On exam patient noted to be well-appearing, no focal neurologic deficits appreciated.  No deformity, no swelling.  No evidence for infectious process, doubt DVT.  X-rays demonstrated significant osteoarthritis.  Suspect this is most likely cause of his symptoms.  Given the reported tingling in his hand, believe he would still benefit from following up with neurology.  For his joint issues, recommend he follow-up with orthopedic surgery.  Case management evaluated patient, provided resources regarding financial assistance, recommended follow-up with primary care doctor in interim.     After the discussed management above, the patient was determined to be safe for discharge.  The patient was in agreement with this plan and all questions regarding their care were answered.  ED return precautions were discussed and the patient will return to the ED with any significant worsening of condition.   Final Clinical Impression(s) / ED Diagnoses Final diagnoses:  Osteoarthritis, unspecified osteoarthritis type, unspecified site  Arthralgia, unspecified joint    Rx / DC Orders ED Discharge Orders         Ordered    meloxicam (MOBIC) 7.5 MG tablet  Daily     12/08/19 1019           Milagros Loll, MD 12/08/19 [phone]

## 2019-12-08 NOTE — Discharge Instructions (Addendum)
Recommend following up with both the neurology specialist as well as the orthopedic specialist.  Please discuss the symptoms you are experiencing today as well as the x-ray findings with the orthopedic doctor.  Would also recommend following up with your primary care doctor. If you have any further issues with scheduling the specialists, your primary doctor should be able to assist. Recommend trial of the anti-inflammatory as prescribed.  If you develop any worsening numbness, any weakness, joint swelling, fever, return to ER for reassessment.

## 2019-12-30 ENCOUNTER — Encounter (INDEPENDENT_AMBULATORY_CARE_PROVIDER_SITE_OTHER): Payer: Self-pay | Admitting: Primary Care

## 2019-12-30 ENCOUNTER — Other Ambulatory Visit: Payer: Self-pay

## 2019-12-30 ENCOUNTER — Ambulatory Visit (INDEPENDENT_AMBULATORY_CARE_PROVIDER_SITE_OTHER): Payer: Self-pay | Admitting: Primary Care

## 2019-12-30 DIAGNOSIS — M542 Cervicalgia: Secondary | ICD-10-CM

## 2019-12-30 DIAGNOSIS — M19049 Primary osteoarthritis, unspecified hand: Secondary | ICD-10-CM

## 2019-12-30 DIAGNOSIS — M2550 Pain in unspecified joint: Secondary | ICD-10-CM

## 2019-12-30 DIAGNOSIS — Z09 Encounter for follow-up examination after completed treatment for conditions other than malignant neoplasm: Secondary | ICD-10-CM

## 2019-12-30 DIAGNOSIS — M549 Dorsalgia, unspecified: Secondary | ICD-10-CM

## 2019-12-30 NOTE — Progress Notes (Signed)
A  Virtual Visit via Telephone Note  I connected with Mark Hampton on 12/30/19 at  1:50 PM EST by telephone and verified that I am speaking with the correct person using two identifiers.   I discussed the limitations, risks, security and privacy concerns of performing an evaluation and management service by telephone and the availability of in person appointments. I also discussed with the patient that there may be a patient responsible charge related to this service. The patient expressed understanding and agreed to proceed.   History of Present Illness: Mark Hampton is having a tele visit for ncreased pain in his shoulders, back and legs causing difficulty to walk and hospital follow up.  His is concern about the pain. Explain I would able to provide antiinflammatory and antispasmodic medication. Patient is in agreement. Due to financial hardship patient did not follow up with neurology and able to refer to ortho however cost remains a factor. Advised to apply for Medicaid in the event he is denied he can apply for CAFA.  History reviewed. No pertinent past medical history.  Current Outpatient Medications on File Prior to Visit  Medication Sig Dispense Refill  . cholecalciferol (VITAMIN D3) 25 MCG (1000 UT) tablet Take 1,000 Units by mouth daily.    . meclizine (ANTIVERT) 12.5 MG tablet Take 1 tablet (12.5 mg total) by mouth 3 (three) times daily as needed for dizziness. 30 tablet 0  . meloxicam (MOBIC) 7.5 MG tablet Take 1 tablet (7.5 mg total) by mouth daily. 30 tablet 0  . vitamin E (VITAMIN E) 400 UNIT capsule Take 400 Units by mouth daily.    Marland Kitchen ibuprofen (ADVIL,MOTRIN) 200 MG tablet Take 400 mg by mouth every 6 (six) hours as needed for moderate pain.      No current facility-administered medications on file prior to visit.   Observations/Objective: Review of Systems  Musculoskeletal: Positive for back pain, joint pain, myalgias and neck pain.       Shoulder  All other systems  reviewed and are negative.   Assessment and Plan: Mark Hampton was seen today for joint pain.  Diagnoses and all orders for this visit:  Arthralgia, unspecified joint Alternative treatment  alternate with heat and ice application for pain relief. May also alternate with acetaminophen and Ibuprofen as prescribed pain relief. Other alternatives include massage, acupuncture and water aerobics.  You must stay active and avoid a sedentary lifestyle.  Hospital discharge follow-up Patient presented to the emergency room on 12/08/2019 for X-rays which  demonstrated significant osteoarthritis.  Advised  Due to  tingling in his hand,benefit from following up with neurology.Joint issues, recommend he follow-up with orthopedic surgery.     Follow Up Instructions:    I discussed the assessment and treatment plan with the patient. The patient was provided an opportunity to ask questions and all were answered. The patient agreed with the plan and demonstrated an understanding of the instructions.   The patient was advised to call back or seek an in-person e  valuation if the symptoms worsen or if the condition fails to improve as anticipated.  I provided 10 minutes of non-face-to-face time during this encounter.   Grayce Sessions, NP

## 2020-01-05 ENCOUNTER — Other Ambulatory Visit: Payer: Self-pay

## 2020-01-05 ENCOUNTER — Ambulatory Visit: Payer: Self-pay | Attending: Primary Care

## 2020-01-22 ENCOUNTER — Ambulatory Visit: Payer: Self-pay | Attending: Internal Medicine

## 2020-01-22 DIAGNOSIS — Z23 Encounter for immunization: Secondary | ICD-10-CM

## 2020-01-22 NOTE — Progress Notes (Signed)
   Covid-19 Vaccination Clinic  Name:  Mark Hampton    MRN: [id number] DOB: 1961-02-02  01/22/2020  [name] was observed post Covid-19 immunization for 15 minutes without incident. He was provided with Vaccine Information Sheet and instruction to access the V-Safe system.   [name] was instructed to call 911 with any severe reactions post vaccine: Marland Kitchen Difficulty breathing  . Swelling of face and throat  . A fast heartbeat  . A bad rash all over body  . Dizziness and weakness   Immunizations Administered    Name Date Dose VIS Date Route   Pfizer COVID-19 Vaccine 01/22/2020  4:30 PM 0.3 mL 10/01/2019 Intramuscular   Manufacturer: [employer], [employer]   Lot: [id number]   NDC: [address]-3

## 2020-02-14 ENCOUNTER — Ambulatory Visit (INDEPENDENT_AMBULATORY_CARE_PROVIDER_SITE_OTHER): Payer: Self-pay | Admitting: Primary Care

## 2020-02-14 ENCOUNTER — Encounter (INDEPENDENT_AMBULATORY_CARE_PROVIDER_SITE_OTHER): Payer: Self-pay | Admitting: Primary Care

## 2020-02-14 ENCOUNTER — Other Ambulatory Visit: Payer: Self-pay

## 2020-02-14 ENCOUNTER — Telehealth (INDEPENDENT_AMBULATORY_CARE_PROVIDER_SITE_OTHER): Payer: Self-pay | Admitting: Primary Care

## 2020-02-14 DIAGNOSIS — R202 Paresthesia of skin: Secondary | ICD-10-CM

## 2020-02-14 MED ORDER — IBUPROFEN 600 MG PO TABS
600.0000 mg | ORAL_TABLET | Freq: Three times a day (TID) | ORAL | 1 refills | Status: DC | PRN
Start: 2020-02-14 — End: 2020-08-21

## 2020-02-14 MED ORDER — GABAPENTIN 100 MG PO CAPS: 100.0000 mg | ORAL_CAPSULE | Freq: Three times a day (TID) | ORAL | 3 refills | Status: DC

## 2020-02-14 NOTE — Progress Notes (Signed)
Pain in left arm and leg  Ibuprofen not helping with pain

## 2020-02-14 NOTE — Progress Notes (Signed)
Virtual Visit via Telephone Note  I connected with Mark Hampton on 02/14/20 at  4:10 PM EDT by telephone and verified that I am speaking with the correct person using two identifiers.   I discussed the limitations, risks, security and privacy concerns of performing an evaluation and management service by telephone and the availability of in person appointments. I also discussed with the patient that there may be a patient responsible charge related to this service. The patient expressed understanding and agreed to proceed.   History of Present Illness: Mark Hampton is a 59 year old African American male with a significant history of osteoarthritis. Previous visit prescribed NSAID medication which is not helping. He states his left arm stings , numbness and tingling from shoulder to your finger tips. He was unable to keep appointment with neurologist due to a $200 fee. He is waiting on [facility] assistance.   No past medical history on file.  Current Outpatient Medications on File Prior to Visit  Medication Sig Dispense Refill  . cholecalciferol (VITAMIN D3) 25 MCG (1000 UT) tablet Take 1,000 Units by mouth daily.    . vitamin E (VITAMIN E) 400 UNIT capsule Take 400 Units by mouth daily.     No current facility-administered medications on file prior to visit.   Observations/Objective: Review of Systems  Musculoskeletal:       Shoulder pain numbness and tingling migrating to fingers  All other systems reviewed and are negative.    Assessment and Plan: Diagnoses and all orders for this visit:  Paresthesia Complains of pain starting on left shoulder causing  tingling, and numbness to his fingers. 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 Explained can prescribe gabapentin but neurology consult would be beneficial for the causative factor.  Other orders -     gabapentin (NEURONTIN) 100 MG capsule; Take 1 capsule (100 mg total) by mouth 3  (three) times daily. -     ibuprofen (ADVIL) 600 MG tablet; Take 1 tablet (600 mg total) by mouth every 8 (eight) hours as needed.    Follow Up Instructions:    I discussed the assessment and treatment plan with the patient. The patient was provided an opportunity to ask questions and all were answered. The patient agreed with the plan and demonstrated an understanding of the instructions.   The patient was advised to call back or seek an in-person evaluation if the symptoms worsen or if the condition fails to improve as anticipated.  I provided 8 minutes of non-face-to-face time during this encounter.   Grayce Sessions, NP

## 2020-02-15 ENCOUNTER — Ambulatory Visit: Payer: Self-pay | Attending: Internal Medicine

## 2020-02-15 DIAGNOSIS — Z23 Encounter for immunization: Secondary | ICD-10-CM

## 2020-02-15 MED FILL — IBUPROFEN 600 MG TABLET: 600 | 30 days supply | Qty: 90 | Fill #0

## 2020-02-15 MED FILL — GABAPENTIN 100 MG CAPSULE: 100 | 30 days supply | Qty: 90 | Fill #0

## 2020-02-15 NOTE — Progress Notes (Signed)
   Covid-19 Vaccination Clinic  Name:  Mark Hampton    MRN: [id number] DOB: 12/07/1960  02/15/2020  Mark Hampton was observed post Covid-19 immunization for 15 minutes without incident. He was provided with Vaccine Information Sheet and instruction to access the V-Safe system.   Mark Hampton was instructed to call 911 with any severe reactions post vaccine: Marland Kitchen Difficulty breathing  . Swelling of face and throat  . A fast heartbeat  . A bad rash all over body  . Dizziness and weakness   Immunizations Administered    Name Date Dose VIS Date Route   Pfizer COVID-19 Vaccine 02/15/2020  2:42 PM 0.3 mL 12/15/2018 Intramuscular   Manufacturer: [employer], [employer]   Lot: [id number]   NDC: [address]-3

## 2020-03-03 ENCOUNTER — Telehealth (INDEPENDENT_AMBULATORY_CARE_PROVIDER_SITE_OTHER): Payer: Self-pay

## 2020-03-03 NOTE — Telephone Encounter (Signed)
Patient called requesting [facility] phone number to reschedule his appointment since he has been approved for medicaid.   This is just an [place]

## 2020-03-07 ENCOUNTER — Encounter: Payer: Self-pay | Admitting: Neurology

## 2020-03-07 ENCOUNTER — Ambulatory Visit: Payer: Medicaid Other | Admitting: Neurology

## 2020-03-07 ENCOUNTER — Other Ambulatory Visit: Payer: Self-pay

## 2020-03-07 VITALS — BP 122/80 | HR 85 | Ht 66.0 in | Wt 143.3 lb

## 2020-03-07 DIAGNOSIS — R269 Unspecified abnormalities of gait and mobility: Secondary | ICD-10-CM | POA: Diagnosis not present

## 2020-03-07 DIAGNOSIS — M79602 Pain in left arm: Secondary | ICD-10-CM | POA: Diagnosis not present

## 2020-03-07 DIAGNOSIS — M79605 Pain in left leg: Secondary | ICD-10-CM

## 2020-03-07 NOTE — Patient Instructions (Addendum)
Please talk to your talk to your primary care nurse practitioner, Gwinda Passe about a referral to a spine specialist as you have neck pain and lower back pain.  In addition, you reports shoulder pain and may benefit from seeing an orthopedic specialist.  From my end of things, we will do work-up in the form of blood work to look for inflammatory markers and we will do electrical nerve and muscle testing called EMG and nerve conduction velocity testing of the left arm and left leg to see if you have pinched nerve type findings.  We will call you with the blood test results and the electrical nerve and muscle test results.

## 2020-03-07 NOTE — Progress Notes (Signed)
Subjective:    Patient ID: Mark Hampton is a 59 y.o. male.  HPI     Star Age, MD, PhD [facility] [address] [place], [place] [address]  Dear Sharyn Lull, I saw your patient, Mark Hampton, upon your kind request to my neurologic clinic today for initial consultation of his vertigo.  The patient is unaccompanied today.  Of note, he no showed for an appointment on 11/24/2019.  As you know, Mark Hampton with an underlying medical history of vitamin D deficiency and arthritis, who reports difficulty with his balance and his walking for the past 4+ months.  He denies actual vertigo symptoms such as spinning sensation, he denies nausea, double vision, lightheadedness or hearing loss, no ringing in the ears.  He has had no recurrent headaches.  He reports pain in the left shoulder, he also reports pain in the left arm and in the L leg.  He reports pain in the neck and lower back.  He has been walking with a cane for gait safety.  He has not fallen. I reviewed your office note from 11/16/2019.  He has been on meclizine as needed.  He has not found it helpful.  He presented to the emergency room on 10/17/2019 with left arm pain as well as problem with his gait and feeling of dizziness.  I reviewed the emergency room records.  He had a brain MRI without contrast on 10/17/2019 and I reviewed the results: IMPRESSION: 1. Normal MRI appearance of the brain. No acute or focal lesion to explain the patient's symptoms. 2. Mild diffuse sinus disease. He had a left shoulder x-ray on 10/17/2019 and I reviewed the results: IMPRESSION: Negative.   He has not had any recent blood work.  He has not seen any specialist for his left shoulder pain. He smokes about 6 or 7 cigarettes/day, drinks alcohol in the form of beer, 1/day on average.  He lives with his wife.  He works as a Curator.  He denies any sudden onset of one-sided weakness or  numbness or tingling or droopy face or slurring of speech.  His Past Medical History Is Significant For: No past medical history on file.  His Past Surgical History Is Significant For: No past surgical history on file.  His Family History Is Significant For: No family history on file.  His Social History Is Significant For: Social History   Socioeconomic History  . Marital status: Married    Spouse name: Not on file  . Number of children: Not on file  . Years of education: Not on file  . Highest education level: Not on file  Occupational History  . Not on file  Tobacco Use  . Smoking status: Current Every Day Smoker  . Smokeless tobacco: Never Used  Substance and Sexual Activity  . Alcohol use: Yes  . Drug use: Never  . Sexual activity: Not on file  Other Topics Concern  . Not on file  Social History Narrative  . Not on file   Social Determinants of Health   Financial Resource Strain:   . Difficulty of Paying Living Expenses:   Food Insecurity:   . Worried About Charity fundraiser in the Last Year:   . Arboriculturist in the Last Year:   Transportation Needs:   . Film/video editor (Medical):   Marland Kitchen Lack of Transportation (Non-Medical):   Physical Activity:   . Days of Exercise  per Week:   . Minutes of Exercise per Session:   Stress:   . Feeling of Stress :   Social Connections:   . Frequency of Communication with Friends and Family:   . Frequency of Social Gatherings with Friends and Family:   . Attends Religious Services:   . Active Member of Clubs or Organizations:   . Attends Banker Meetings:   Marland Kitchen Marital Status:     His Allergies Are:  No Known Allergies:   His Current Medications Are:  Outpatient Encounter Medications as of 03/07/2020  Medication Sig  . cholecalciferol (VITAMIN D3) 25 MCG (1000 UT) tablet Take 1,000 Units by mouth daily.  Marland Kitchen gabapentin (NEURONTIN) 100 MG capsule Take 1 capsule (100 mg total) by mouth 3 (three) times  daily.  Marland Kitchen ibuprofen (ADVIL) 600 MG tablet Take 1 tablet (600 mg total) by mouth every 8 (eight) hours as needed.  . vitamin E (VITAMIN E) 400 UNIT capsule Take 400 Units by mouth daily.   No facility-administered encounter medications on file as of 03/07/2020.  :   Review of Systems:  Out of a complete 14 point review of systems, all are reviewed and negative with the exception of these symptoms as listed below:  Review of Systems  Neurological:       Here to discuss worsening balance and right arm/bilateral leg tingling/numbness.     Objective:  Neurological Exam  Physical Exam Physical Examination:   Vitals:   03/07/20 1522  BP: 122/80  Pulse: 85  SpO2: 95%    General Examination: The patient is a very pleasant 59 y.o. male in no acute distress. He appears well-developed and well-nourished and well groomed.  He denies any lightheadedness, especially orthostatic lightheadedness, denies any vertiginous symptoms.  HEENT: Normocephalic, atraumatic, pupils are equal, round and reactive to light and accommodation. Funduscopic exam is normal with sharp disc margins noted. Extraocular tracking is good without limitation to gaze excursion or nystagmus noted. Normal smooth pursuit is noted. Hearing is grossly intact. Face is symmetric with normal facial animation and normal facial sensation. Speech is clear with no dysarthria noted. There is no hypophonia. There is no lip, neck/head, jaw or voice tremor. Neck is supple with full range of passive and active motion. There are no carotid bruits on auscultation. Oropharynx exam reveals: moderate mouth dryness, marginal dental hygiene with several missing teeth. Tongue protrudes centrally and palate elevates symmetrically.   Chest: Clear to auscultation without wheezing, rhonchi or crackles noted.  Heart: S1+S2+0, regular and normal without murmurs, rubs or gallops noted.   Abdomen: Soft, non-tender and non-distended with normal bowel sounds  appreciated on auscultation.  Extremities: There is no pitting edema in the distal lower extremities bilaterally. Pedal pulses are intact.  Skin: Warm and dry without trophic changes noted. There are no varicose veins.  Musculoskeletal: exam reveals no obvious joint deformities, tenderness or joint swelling or erythema.   Neurologically:  Mental status: The patient is awake, alert and oriented in all 4 spheres. His immediate and remote memory, attention, language skills and fund of knowledge are appropriate. There is no evidence of aphasia, agnosia, apraxia or anomia. Speech is clear with normal prosody and enunciation. Thought process is linear. Mood is normal and affect is normal.  Cranial nerves II - XII are as described above under HEENT exam. In addition: shoulder shrug is normal with equal shoulder height noted. Motor exam: Thin bulk, global strength of 4+ out of 5, some giveaway weakness in the  left upper extremity proximally.  No focal atrophy noted.  There is no drift, tremor or rebound. Romberg is negative. Reflexes are 3+ throughout. Babinski: Toes are flexor on the right, equivocal on the left. Fine motor skills and coordination: intact with normal finger taps, normal hand movements, normal rapid alternating patting, normal foot taps and normal foot agility.  Cerebellar testing: No dysmetria or intention tremor on finger to nose testing. Heel to shin is unremarkable bilaterally. There is no truncal or gait ataxia.  Sensory exam: intact to light touch, vibration, temperature sense in the upper and lower extremities.  Gait, station and balance: He stands with mild difficulty and reports low back pain.  It is not radiating currently.  He walks with a walking stick but can also walk without it, he walks with slight insecurity, nonspecific, no shuffling, preserved arm swing noted, posture is age-appropriate.   Assessment and Plan:  In summary, Mark Hampton is a very pleasant 59 y.o.-year old  male with an underlying medical history of vitamin D deficiency and arthritis, who presents with a referral for vertigo.  He reports left arm pain, and balance problems as well as gait difficulty for the past 4+ months.  Examination shows some nonspecific weakness, pain reported in the left upper extremity, no telltale exam findings for neuropathy or radiculopathy, he does have brisk reflexes throughout.  No focal atrophy but strength is globally about 4+.  Given that he reports shoulder pain, he may benefit from seeing an orthopedic specialist.  He is encouraged to talk to you about a referral.  In addition, he may benefit from seeing a spine specialist.  Nevertheless, we will proceed with further work-up from my end of things including blood work today to include inflammatory markers, thyroid screening test, muscle enzyme, and vitamin B12.  We will call him with the test results.  In addition, I would like to proceed with EMG and nerve conduction velocity testing of the left upper and left lower extremities to look for signs of radiculopathy.  His history and exam are not in keeping with peripheral neuropathy.  His history and exam are also not classic for positional vertigo.  He is advised to talk to you about his chronic neck and back pain as well as shoulder pain and follow-up with you on a scheduled basis.  We will keep him posted as to his test results and proceed from there.   I answered all his questions today and he was in agreement.  Thank you very much for allowing me to participate in the care of this nice patient. If I can be of any further assistance to you please do not hesitate to call me at [phone].  Sincerely,   Huston Foley, MD, PhD

## 2020-03-09 NOTE — Progress Notes (Signed)
Lab test results were benign, 2 things are pending namely vitamin B1 and B6, please advise patient that his tests were normal and we will call if the 2 pending tests are abnormal.

## 2020-03-12 LAB — HTLV I+II ANTIBODIES, (EIA), BLD: HTLV I/II Ab: NEGATIVE

## 2020-03-12 LAB — RPR: RPR Ser Ql: NONREACTIVE

## 2020-03-12 LAB — HGB A1C W/O EAG: Hgb A1c MFr Bld: 5.3 % (ref 4.8–5.6)

## 2020-03-12 LAB — RHEUMATOID FACTOR: Rheumatoid fact SerPl-aCnc: <10 IU/mL (ref 0.0–13.9)

## 2020-03-12 LAB — TSH: TSH: 0.912 u[IU]/mL (ref 0.450–4.500)

## 2020-03-12 LAB — B12 AND FOLATE PANEL
Folate: 5.6 ng/mL (ref >3.0)
Vitamin B-12: 1073 pg/mL (ref 232–1245)

## 2020-03-12 LAB — CK: Total CK: 293 U/L (ref 41–331)

## 2020-03-12 LAB — VITAMIN B1: Thiamine: 103.9 nmol/L (ref 66.5–200.0)

## 2020-03-12 LAB — C-REACTIVE PROTEIN: CRP: 3 mg/L (ref 0–10)

## 2020-03-12 LAB — VITAMIN B6: Vitamin B6: 29.6 ug/L (ref 5.3–46.7)

## 2020-03-12 LAB — ANA W/REFLEX: Anti Nuclear Antibody (ANA): NEGATIVE

## 2020-03-13 ENCOUNTER — Telehealth: Payer: Self-pay

## 2020-03-13 NOTE — Telephone Encounter (Signed)
I contacted the pt and advised of result note. He verbalized understanding and had no questions/concerns at this time. Pt will keep EMG/NCS as scheduled for 03/29/2020.

## 2020-03-13 NOTE — Telephone Encounter (Signed)
-----   Message from Huston Foley, MD sent at 03/09/2020  4:53 PM EDT ----- Lab test results were benign, 2 things are pending namely vitamin B1 and B6, please advise patient that his tests were normal and we will call if the 2 pending tests are abnormal.

## 2020-03-29 ENCOUNTER — Ambulatory Visit (INDEPENDENT_AMBULATORY_CARE_PROVIDER_SITE_OTHER): Payer: Medicaid Other | Admitting: Neurology

## 2020-03-29 ENCOUNTER — Encounter: Payer: Self-pay | Admitting: Neurology

## 2020-03-29 ENCOUNTER — Other Ambulatory Visit: Payer: Self-pay

## 2020-03-29 ENCOUNTER — Ambulatory Visit: Payer: Medicaid Other | Admitting: Neurology

## 2020-03-29 DIAGNOSIS — M79602 Pain in left arm: Secondary | ICD-10-CM | POA: Diagnosis not present

## 2020-03-29 DIAGNOSIS — M79605 Pain in left leg: Secondary | ICD-10-CM | POA: Diagnosis not present

## 2020-03-29 DIAGNOSIS — G5603 Carpal tunnel syndrome, bilateral upper limbs: Secondary | ICD-10-CM

## 2020-03-29 DIAGNOSIS — R269 Unspecified abnormalities of gait and mobility: Secondary | ICD-10-CM

## 2020-03-29 HISTORY — DX: Carpal tunnel syndrome, bilateral upper limbs: G56.03

## 2020-03-29 NOTE — Progress Notes (Signed)
Please refer to EMG and nerve conduction procedure note.  

## 2020-03-29 NOTE — Progress Notes (Signed)
MNC    Nerve / Sites Muscle Latency Ref. Amplitude Ref. Rel Amp Segments Distance Velocity Ref. Area    ms ms mV mV %  cm m/s m/s mVms  L Median - APB     Wrist APB 5.2 ?4.4 5.3 ?4.0 100 Wrist - APB 7   18.4     Upper arm APB 10.3  5.5  104 Upper arm - Wrist 26 52 ?49 18.0  R Median - APB     Wrist APB 5.2 ?4.4 4.9 ?4.0 100 Wrist - APB 7   15.5     Upper arm APB 10.4  5.0  103 Upper arm - Wrist 25 49 ?49 15.5  L Ulnar - ADM     Wrist ADM 2.3 ?3.3 9.4 ?6.0 100 Wrist - ADM 7   28.1     B.Elbow ADM 6.9  8.4  89.1 B.Elbow - Wrist 24 53 ?49 27.9     A.Elbow ADM 8.9  8.1  96.5 A.Elbow - B.Elbow 10 49 ?49 27.5  L Peroneal - EDB     Ankle EDB 4.6 ?6.5 5.7 ?2.0 100 Ankle - EDB 9   17.2     Fib head EDB 11.5  5.1  89.2 Fib head - Ankle 30 44 ?44 17.4     Pop fossa EDB 13.8  4.5  88.5 Pop fossa - Fib head 10 44 ?44 15.9         Pop fossa - Ankle      L Tibial - AH     Ankle AH 4.3 ?5.8 4.0 ?4.0 100 Ankle - AH 9   10.7     Pop fossa AH 14.1  2.9  73.7 Pop fossa - Ankle 41 42 ?41 9.9                SNC    Nerve / Sites Rec. Site Peak Lat Ref.  Amp Ref. Segments Distance    ms ms V V  cm  L Sural - Ankle (Calf)     Calf Ankle 4.4 ?4.4 6 ?6 Calf - Ankle 14  L Superficial peroneal - Ankle     Lat leg Ankle 4.4 ?4.4 7 ?6 Lat leg - Ankle 14  L Median - Orthodromic (Dig II, Mid palm)     Dig II Wrist 4.9 ?3.4 3 ?10 Dig II - Wrist 13  R Median - Orthodromic (Dig II, Mid palm)     Dig II Wrist 4.8 ?3.4 4 ?10 Dig II - Wrist 13  L Ulnar - Orthodromic, (Dig V, Mid palm)     Dig V Wrist 3.1 ?3.1 8 ?5 Dig V - Wrist 64               F  Wave    Nerve F Lat Ref.   ms ms  L Tibial - AH 53.8 ?56.0  L Ulnar - ADM 30.8 ?32.0

## 2020-03-29 NOTE — Procedures (Signed)
HISTORY:  Mark Hampton is a 59 year old gentleman with a spontaneous onset of left arm and left leg discomfort that began about 6 months prior to this evaluation.  The patient also has bilateral hand numbness that is worse on the left.  He reports neck pain and low back pain with pain radiating down the leg to the foot and down the arm to the hand.  He is being evaluated for a possible neuropathy or a radiculopathy.  NERVE CONDUCTION STUDIES:  Nerve conduction studies were performed on both upper extremities.  The distal motor latencies for the median nerves were prolonged bilaterally with normal motor amplitudes seen for these nerves.  The distal motor latency for the left ulnar nerve was normal with a normal motor amplitude.  The nerve conduction velocities for the median nerves bilaterally and for the left ulnar nerve were normal.  The sensory latencies for the median nerves were prolonged bilaterally and the sensory latency for the left ulnar nerve was normal.  The F-wave latency for the left ulnar nerve was normal.  Nerve conduction studies were performed on the left lower extremity. The distal motor latencies and motor amplitudes for the peroneal and posterior tibial nerves were within normal limits. The nerve conduction velocities for these nerves were also normal. The sensory latencies for the peroneal and sural nerves were within normal limits. The F wave latency for the posterior tibial nerve was within normal limits.   EMG STUDIES:  EMG study was performed on the left upper extremity:  The first dorsal interosseous muscle reveals 2 to 4 K units with full recruitment. No fibrillations or positive waves were noted. The abductor pollicis brevis muscle reveals 2 to 4 K units with full recruitment. No fibrillations or positive waves were noted. The extensor indicis proprius muscle reveals 1 to 3 K units with full recruitment. No fibrillations or positive waves were noted. The  pronator teres muscle reveals 2 to 3 K units with full recruitment. No fibrillations or positive waves were noted. The biceps muscle reveals 1 to 2 K units with full recruitment. No fibrillations or positive waves were noted. The triceps muscle reveals 2 to 4 K units with full recruitment. No fibrillations or positive waves were noted. The anterior deltoid muscle reveals 2 to 3 K units with full recruitment. No fibrillations or positive waves were noted. The cervical paraspinal muscles were tested at 2 levels. No abnormalities of insertional activity were seen at either level tested. There was poor relaxation.  EMG study was performed on the left lower extremity:  The tibialis anterior muscle reveals 2 to 4K motor units with full recruitment. No fibrillations or positive waves were seen. The peroneus tertius muscle reveals 2 to 4K motor units with full recruitment. No fibrillations or positive waves were seen. The medial gastrocnemius muscle reveals 1 to 3K motor units with full recruitment. No fibrillations or positive waves were seen. The vastus lateralis muscle reveals 2 to 4K motor units with full recruitment. No fibrillations or positive waves were seen. The iliopsoas muscle reveals 2 to 4K motor units with full recruitment. No fibrillations or positive waves were seen. The biceps femoris muscle (long head) reveals 2 to 4K motor units with full recruitment. No fibrillations or positive waves were seen. The lumbosacral paraspinal muscles were tested at 3 levels, and revealed no abnormalities of insertional activity at all 3 levels tested. There was fair relaxation.   IMPRESSION:  Nerve conduction studies done on both upper extremities shows  evidence of mild bilateral carpal tunnel syndrome.  Nerve conduction study of the left lower extremity was unremarkable, there is no evidence of a generalized peripheral neuropathy.  EMG evaluation of the left upper and left lower extremities were  relatively unremarkable, this study was slightly technically difficult due to very poor relaxation on the part of the patient during the entire EMG evaluation.  There is no evidence of a left cervical or left lumbosacral radiculopathy on this evaluation.  Jill Alexanders MD 03/29/2020 2:44 PM  [facility] [address] [place], [place] [address]  Phone [phone] Fax [phone]

## 2020-04-11 ENCOUNTER — Telehealth: Payer: Self-pay | Admitting: Neurology

## 2020-04-11 NOTE — Telephone Encounter (Signed)
Please call patient and advise him that his EMG and nerve conduction velocity test showed evidence of mild carpal tunnel in both hands.  He may benefit from using a wrist splint which he can find over-the-counter (bandage or Ace wrap section).  It may alleviate symptoms of carpal tunnel especially at night if he uses the wrist splint at night, overall no explanation for his low back pain or arm pain, no evidence of widespread nerve damage or pinched nerve type findings, in that regard benign study.  As discussed, I would encourage patient to talk to his primary care provider about a referral to orthopedics.

## 2020-04-11 NOTE — Telephone Encounter (Signed)
Dr. Frances Furbish can you result when you can, thanks! Study was completed on 03/29/2020.

## 2020-04-11 NOTE — Telephone Encounter (Signed)
Pt is asking for a call with the results to her NCV/EMG

## 2020-04-12 NOTE — Telephone Encounter (Signed)
I called pt. No answer, left a message asking pt to call me back.   

## 2020-04-14 NOTE — Telephone Encounter (Signed)
Pt is asking for a call back from Megan,RN  

## 2020-04-17 ENCOUNTER — Telehealth (INDEPENDENT_AMBULATORY_CARE_PROVIDER_SITE_OTHER): Payer: Self-pay

## 2020-04-17 NOTE — Telephone Encounter (Signed)
Patient called requesting a referral to orthopedics referral. States he was advice by his neurologist to contact PCP.   Please advice [phone]

## 2020-04-17 NOTE — Telephone Encounter (Signed)
I contacted the pt and advised of result.  Pt verbalized understanding and will talk with PCP in regards to orthopedic referral.

## 2020-04-19 ENCOUNTER — Other Ambulatory Visit (INDEPENDENT_AMBULATORY_CARE_PROVIDER_SITE_OTHER): Payer: Self-pay | Admitting: Primary Care

## 2020-04-19 DIAGNOSIS — G5603 Carpal tunnel syndrome, bilateral upper limbs: Secondary | ICD-10-CM

## 2020-04-26 ENCOUNTER — Ambulatory Visit (INDEPENDENT_AMBULATORY_CARE_PROVIDER_SITE_OTHER): Payer: Medicaid Other

## 2020-04-26 ENCOUNTER — Ambulatory Visit (INDEPENDENT_AMBULATORY_CARE_PROVIDER_SITE_OTHER): Payer: Medicaid Other | Admitting: Orthopaedic Surgery

## 2020-04-26 ENCOUNTER — Encounter: Payer: Self-pay | Admitting: Orthopaedic Surgery

## 2020-04-26 DIAGNOSIS — M542 Cervicalgia: Secondary | ICD-10-CM | POA: Diagnosis not present

## 2020-04-26 DIAGNOSIS — M5441 Lumbago with sciatica, right side: Secondary | ICD-10-CM

## 2020-04-26 DIAGNOSIS — M5442 Lumbago with sciatica, left side: Secondary | ICD-10-CM

## 2020-04-26 MED ORDER — PREDNISONE 10 MG (21) PO TBPK
ORAL_TABLET | ORAL | 0 refills | Status: DC
Start: 2020-04-26 — End: 2020-08-21

## 2020-04-26 MED ORDER — METHOCARBAMOL 500 MG PO TABS: 500.0000 mg | ORAL_TABLET | Freq: Two times a day (BID) | ORAL | 0 refills | Status: DC | PRN

## 2020-04-26 NOTE — Progress Notes (Signed)
Office Visit Note   Patient: Mark Hampton           Date of Birth: 06-06-1961           MRN: [id number] Visit Date: 04/26/2020              Requested by: Grayce Sessions, NP [address] [place],  [place] [address] PCP: Grayce Sessions, NP   Assessment & Plan: Visit Diagnoses:  1. Bilateral low back pain with bilateral sciatica, unspecified chronicity   2. Neck pain   3. 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  The patient has tried conservative treatment options for an extended period of time and we will at this point we need to proceed with MRIs of both the cervical and lumbar spine.  We will also start him on a Sterapred taper muscle relaxer in the meantime.  We will follow up with [place] once his MRIs have been completed.  Follow-Up Instructions: Return for after MRI.   Orders:  Orders Placed This Encounter  Procedures  . XR Cervical Spine 2 or 3 views  . XR Lumbar Spine 2-3 Views  . MR Cervical Spine w/o contrast  . MR Lumbar Spine w/o contrast   Meds ordered this encounter  Medications  . predniSONE (STERAPRED UNI-PAK 21 TAB) 10 MG (21) TBPK tablet    Sig: Take as directed    Dispense:  21 tablet    Refill:  0  . methocarbamol (ROBAXIN) 500 MG tablet    Sig: Take 1 tablet (500 mg total) by mouth 2 (two) times daily as needed.    Dispense:  20 tablet    Refill:  0      Procedures: No procedures performed   Clinical Data: No additional findings.   Subjective: Chief Complaint  Patient presents with  . Right Wrist - Pain  . Left Wrist - Pain    HPI patient is a pleasant 59 year old gentleman who comes in today with bilateral upper and lower extremity numbness and tingling.  He notes this has been ongoing since December of this past year.  No specific injury.  He was seen by [facility] where EMGs were obtained of both bilateral upper and lower extremities.  It was noted that he had  mild bilateral carpal tunnel syndrome.  Negative left lower extremity nerve conduction study and no evidence of generalized peripheral neuropathy.  In regards to his upper extremities, he notes that the left is more symptomatic than the right.  He notes numbness and tingling from his shoulders to the fingertips of all 5 fingers.  This is constant.  He notes that he is constantly dropping things.  In regards to the legs, the left is more symptomatic.  The pain he has is to the left lower back and the numbness and tingling radiates down from that area to the feet.  This is throughout the entirety of both legs.  He notes this is constant as well.  His wife states that alcohol makes him stiff.  He does note that he drinks 1 beer on occasion.  He has tried Tylenol and gabapentin without relief of symptoms.  He denies any bowel or bladder change and no saddle paresthesias.  Review of Systems as detailed in HPI.  All others reviewed and are negative.   Objective: Vital Signs: There were no vitals taken for this visit.  Physical Exam well-developed and well-nourished gentleman in no acute distress.  Alert and oriented x3.  Ortho Exam examination of both upper extremities reveal 4 out of 5 strength with biceps and triceps.  4 out of 5 grip strength.  Decreased sensation throughout.  Positive Phalen both sides.  Positive Tinel at the elbow and wrist on the left, negative on the right.  Examination of both lower extremities reveal full strength throughout.  Positive straight leg raise both sides.  Does have slight pain to the left lumbar musculature.  Cervical spine is no spinous tenderness.   Specialty Comments:  No specialty comments available.  Imaging: XR Cervical Spine 2 or 3 views  Result Date: 04/26/2020 Marked degenerative changes throughout with straightening of the cervical spine  XR Lumbar Spine 2-3 Views  Result Date: 04/26/2020 Multilevel spondylosis worse L5-S1    PMFS History: Patient  Active Problem List   Diagnosis Date Noted  . Bilateral carpal tunnel syndrome 03/29/2020   Past Medical History:  Diagnosis Date  . Bilateral carpal tunnel syndrome 03/29/2020    History reviewed. No pertinent family history.  History reviewed. No pertinent surgical history. Social History   Occupational History  . Not on file  Tobacco Use  . Smoking status: Current Every Day Smoker  . Smokeless tobacco: Never Used  Vaping Use  . Vaping Use: Never used  Substance and Sexual Activity  . Alcohol use: Yes  . Drug use: Never  . Sexual activity: Not on file

## 2020-04-27 MED FILL — METHOCARBAMOL 500 MG TABS: 500 | 10 days supply | Qty: 20 | Fill #0

## 2020-04-27 MED FILL — predniSONE 10 MG TABS: 10 | 6 days supply | Qty: 21 | Fill #0

## 2020-05-14 ENCOUNTER — Ambulatory Visit
Admission: RE | Admit: 2020-05-14 | Discharge: 2020-05-14 | Disposition: A | Discharge status: Home / Self Care | Payer: Medicaid Other | Source: Ambulatory Visit | Attending: Orthopaedic Surgery | Admitting: Orthopaedic Surgery

## 2020-05-14 ENCOUNTER — Other Ambulatory Visit: Payer: Self-pay

## 2020-05-14 DIAGNOSIS — M542 Cervicalgia: Secondary | ICD-10-CM

## 2020-05-14 DIAGNOSIS — M5442 Lumbago with sciatica, left side: Secondary | ICD-10-CM

## 2020-05-14 IMAGING — MR MR CERVICAL SPINE W/O CM
4 of 5 series · 29 of 48 positions shown · non-contrast
Comparison: [DATE] cervical spine radiographs.

CLINICAL DATA: Neck pain

EXAM:
MRI CERVICAL SPINE WITHOUT CONTRAST
TECHNIQUE: Multiplanar, multisequence MR imaging of the cervical spine was
performed. No intravenous contrast was administered.

[Series 3: T2 · sagittal · 3.0mm · 0.66mm/px · 8 of 14 slices shown (1 of 2)]
[im 1/14]
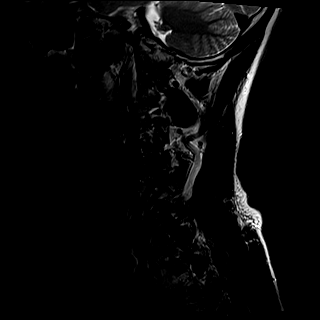
[im 2/14]
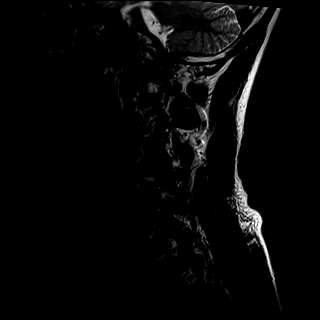
[im 4/14]
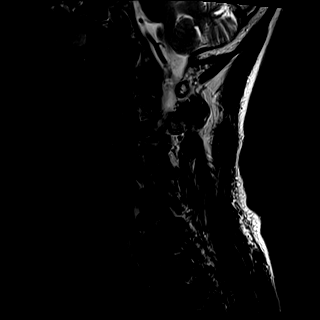
[im 6/14]
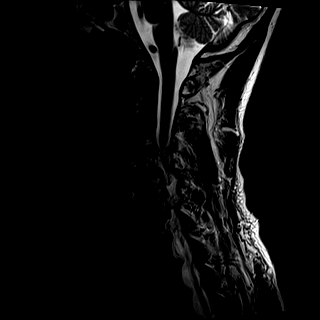
[im 8/14]
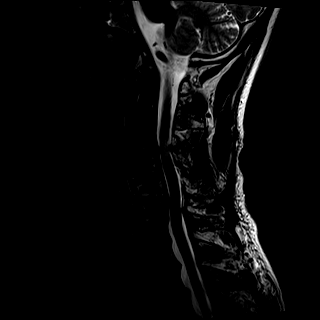
[im 10/14]
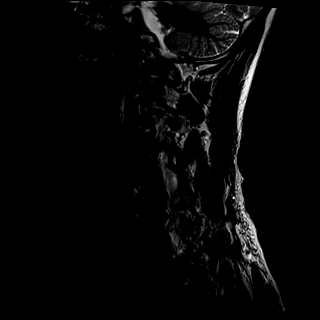
[im 12/14]
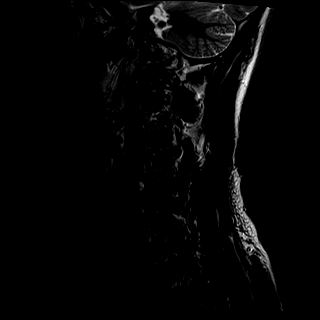
[im 14/14]
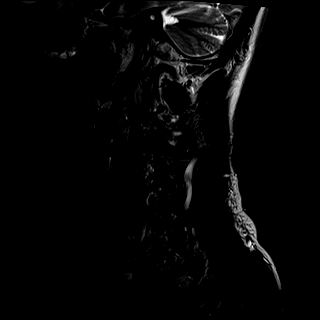

[Series 4: T1 · sagittal · 3.0mm · 0.41mm/px · 7 of 14 slices shown]
[im 1/14]
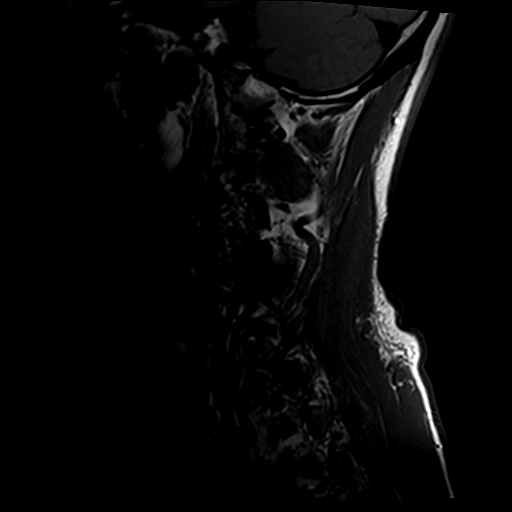
[im 3/14]
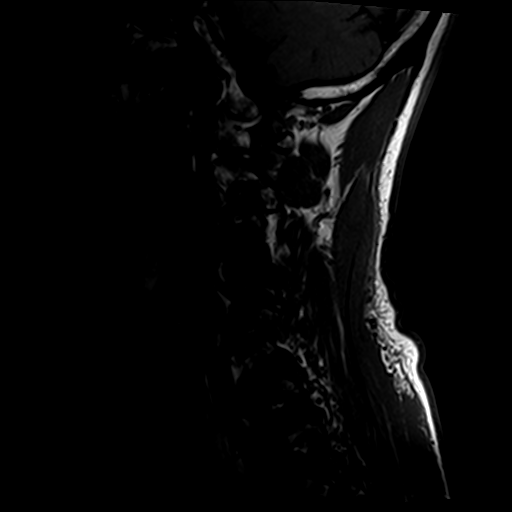
[im 5/14]
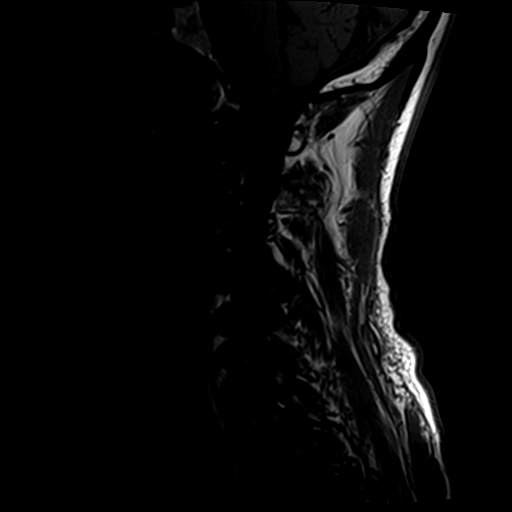
[im 7/14]
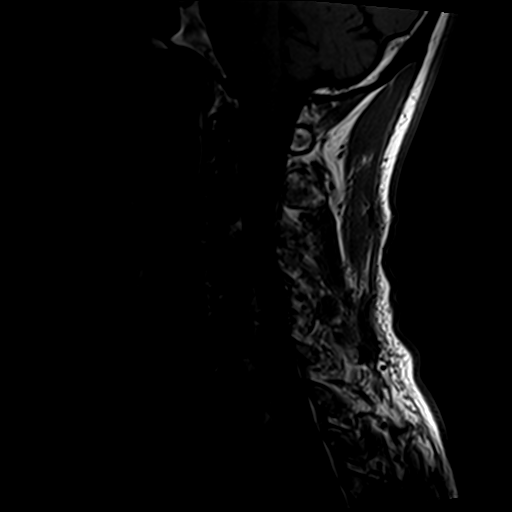
[im 9/14]
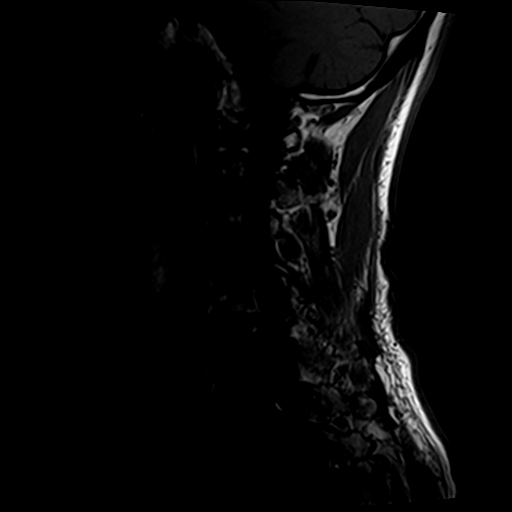
[im 11/14]
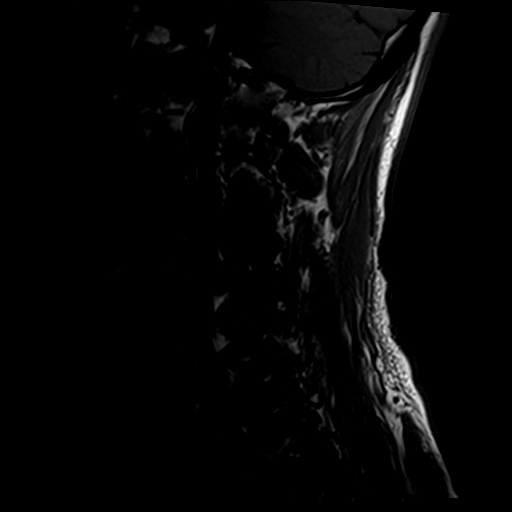
[im 14/14]
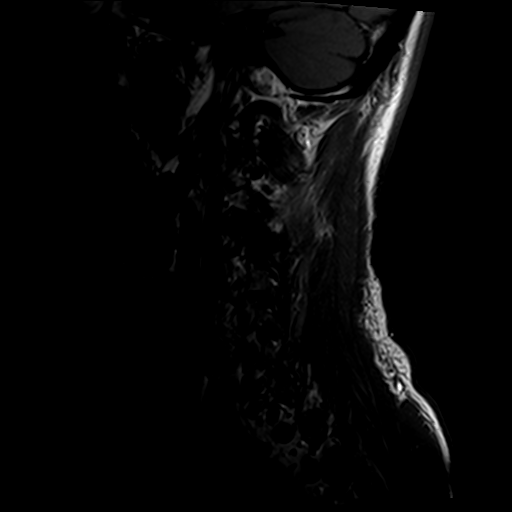

[Series 5: tir sag · sagittal · 3.0mm · 0.41mm/px · 5 of 14 slices shown]
[im 1/14]
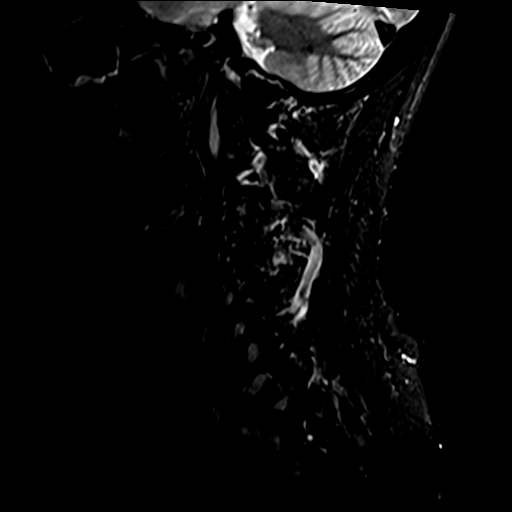
[im 3/14]
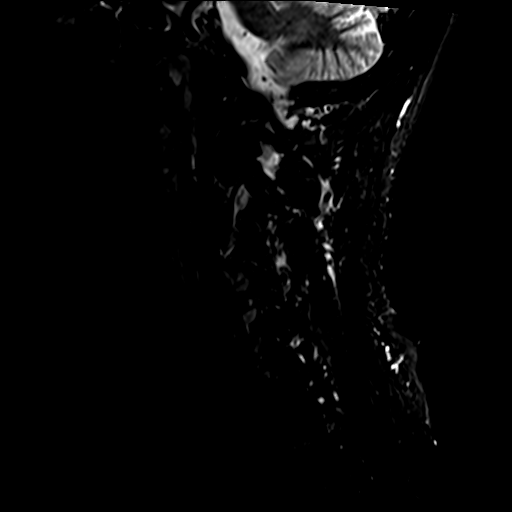
[im 5/14]
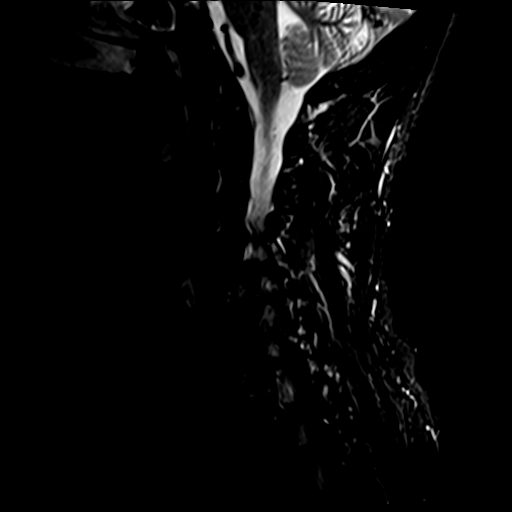
[im 7/14]
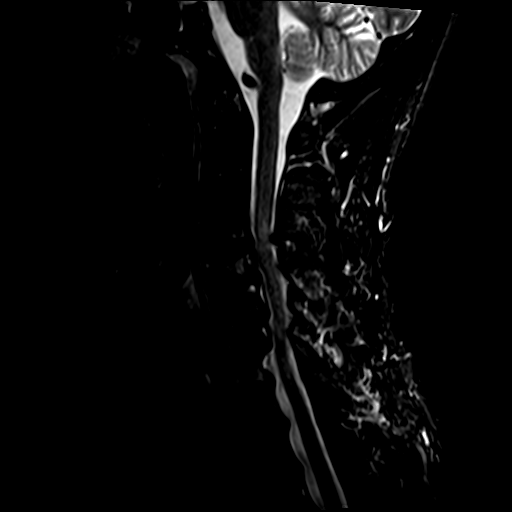
[im 11/14]
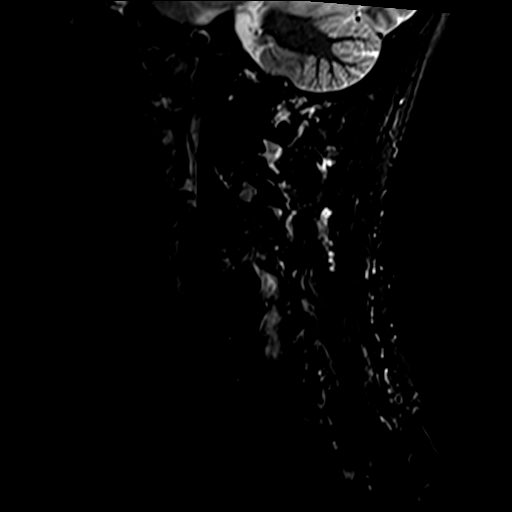

[Series 7: T2 · axial · 3.0mm · 0.70mm/px · z∈[-28,+65]mm · 9 of 26 slices shown (2 of 2)]
[im 1/26]
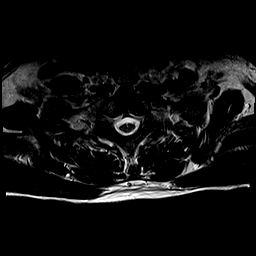
[im 5/26]
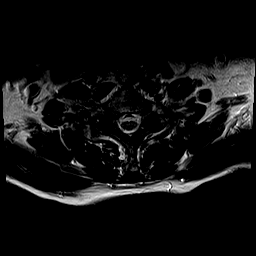
[im 9/26]
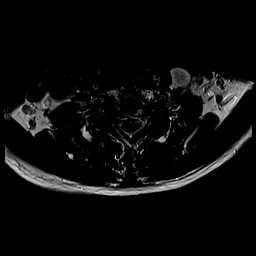
[im 11/26]
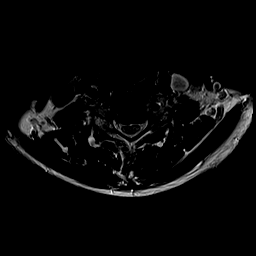
[im 13/26]
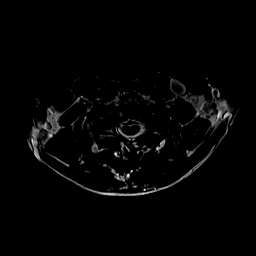
[im 15/26]
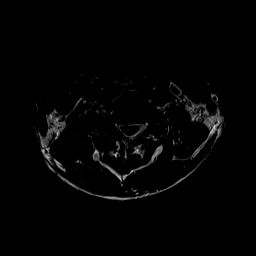
[im 17/26]
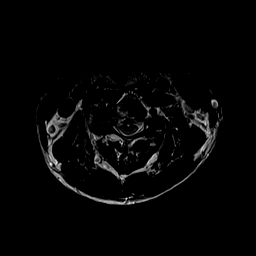
[im 21/26]
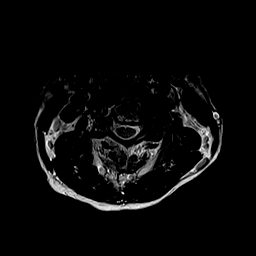
[im 26/26]
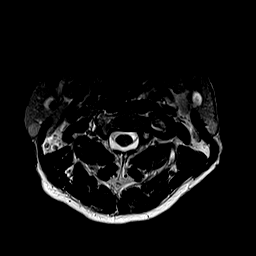

[29 of 48 positions shown; findings below may reference images not displayed]

FINDINGS: Alignment: Reversal of cervical lordosis with grade 1 C4-5
anterolisthesis. Stepwise grade 1 C5-6 and C6-7 retrolisthesis.

Vertebrae: Multilevel Modic type 2 endplate degenerative changes. No
focal osseous lesion. Scattered T1/T2 hyperintense foci reflect
hemangiomata versus focal fat.

Cord: Focal T2 hyperintense signal at the C3-4 level ([DATE])

Posterior Fossa, vertebral arteries: Negative.

Disc levels: Multilevel osteophytosis, desiccation and disc space
loss most prominent at the C5-6 and C6-7 levels.

C2-3: No significant disc bulge, spinal canal or neural foraminal
narrowing.

C3-4: Disc osteophyte complex with superimposed central protrusion
abutting the ventral cord, uncovertebral and bilateral facet
hypertrophy. Moderate spinal canal, moderate right and severe left
neural foraminal narrowing.

C4-5: Disc osteophyte complex with superimposed small right
subarticular/foraminal protrusion, uncovertebral and bilateral facet
hypertrophy. Mild spinal canal, moderate right and mild left neural
foraminal narrowing.

C5-6: Disc osteophyte complex with superimposed small right
paracentral protrusion, uncovertebral and bilateral facet
hypertrophy. Mild spinal canal and moderate bilateral neural
foraminal narrowing.

C6-7: Disc osteophyte complex with superimposed small bilateral
paracentral protrusions, uncovertebral and bilateral facet
hypertrophy. Moderate spinal canal and moderate bilateral neural
foraminal narrowing.

C7-T1: Disc osteophyte complex with superimposed right foraminal
protrusion, uncovertebral and bilateral facet hypertrophy. Mild
spinal canal, moderate right and mild left neural foraminal
narrowing.

Paraspinal tissues: Within normal limits.
IMPRESSION: Moderate spinal canal and moderate to severe neural foraminal
narrowing at the C3-4 level with focal myelomalacia.

Moderate C6-7 spinal canal narrowing.

Moderate right C4-5, C7-T1 and bilateral C5-7 neural foraminal
narrowing.

## 2020-05-14 IMAGING — MR MR LUMBAR SPINE W/O CM
4 of 5 series · 25 of 48 positions shown · non-contrast
Comparison: [DATE] lumbar spine radiographs.

CLINICAL DATA: Back pain.

EXAM:
MRI LUMBAR SPINE WITHOUT CONTRAST
TECHNIQUE: Multiplanar, multisequence MR imaging of the lumbar spine was
performed. No intravenous contrast was administered.

[Series 3: T2 · sagittal · 4.0mm · 0.55mm/px · 6 of 15 slices shown (1 of 2)]
[im 1/15]
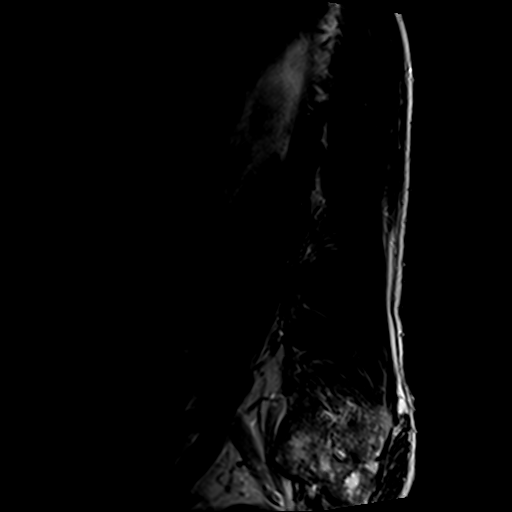
[im 3/15]
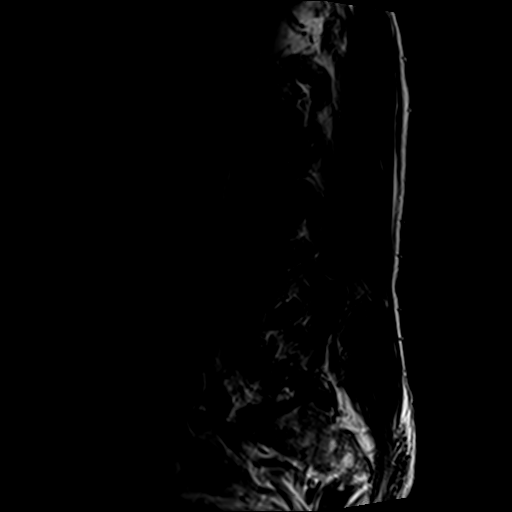
[im 6/15]
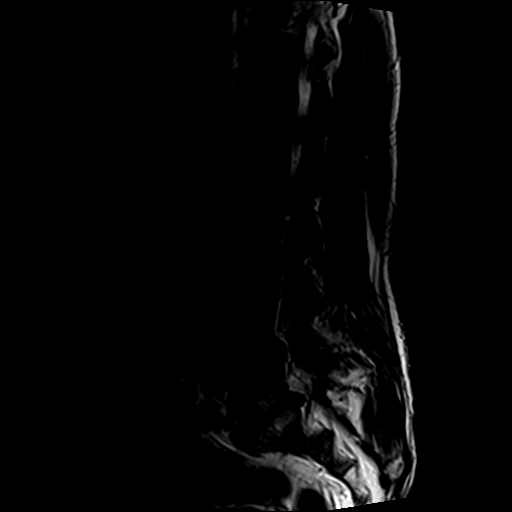
[im 9/15]
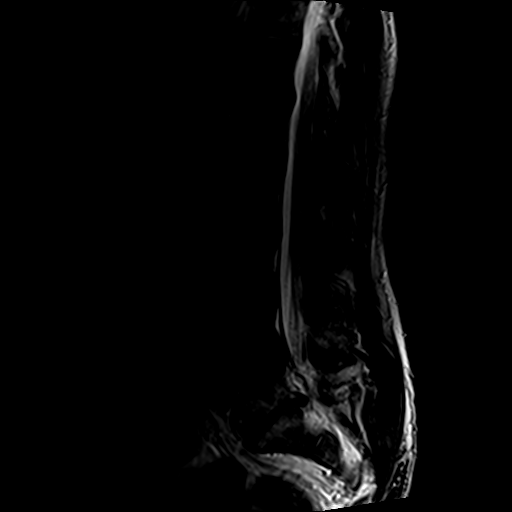
[im 12/15]
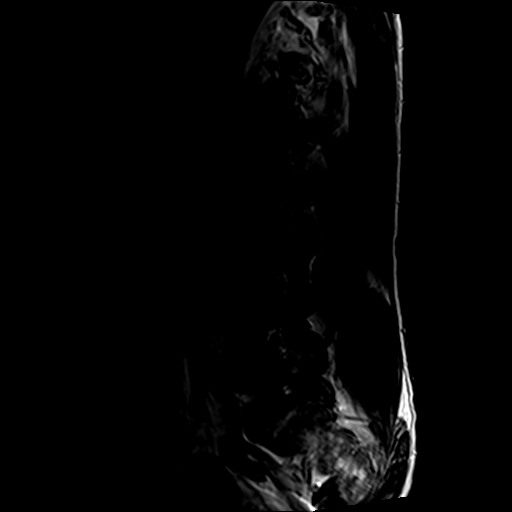
[im 15/15]
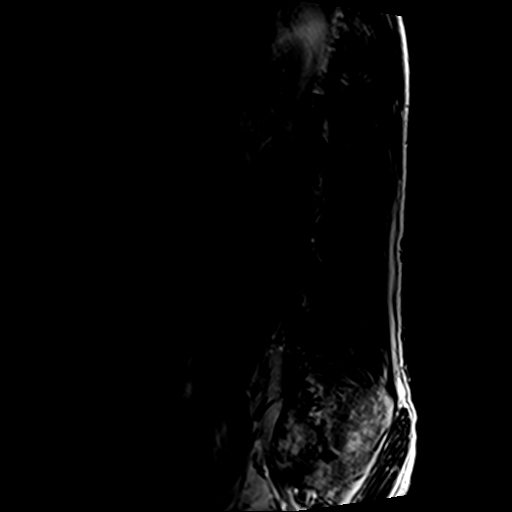

[Series 5: T1 · sagittal · 4.0mm · 0.55mm/px · 6 of 15 slices shown (1 of 2)]
[im 1/15]
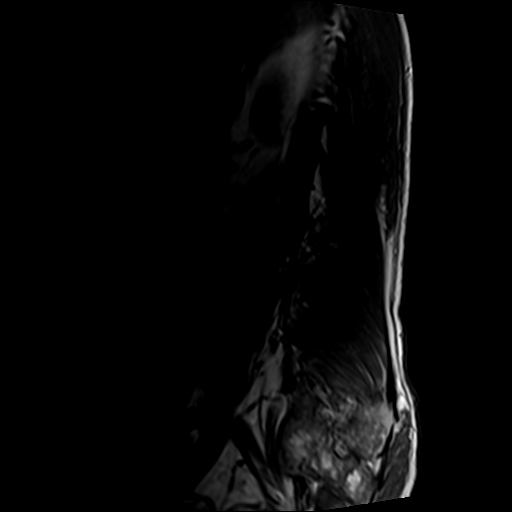
[im 3/15]
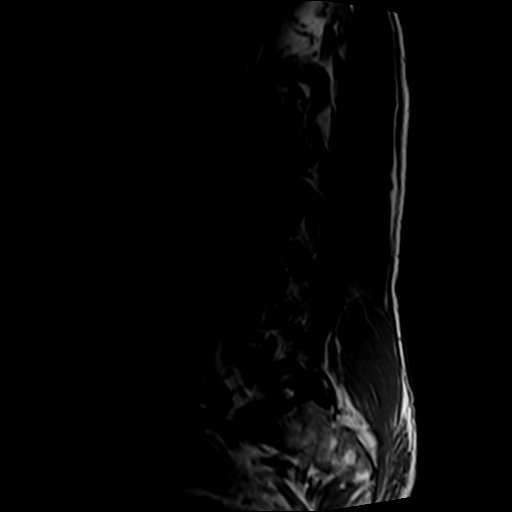
[im 6/15]
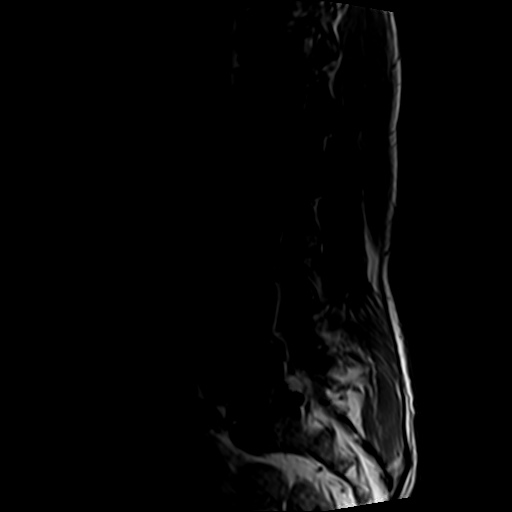
[im 9/15]
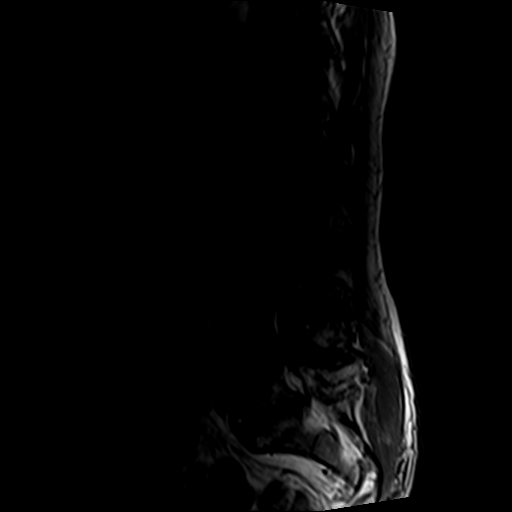
[im 12/15]
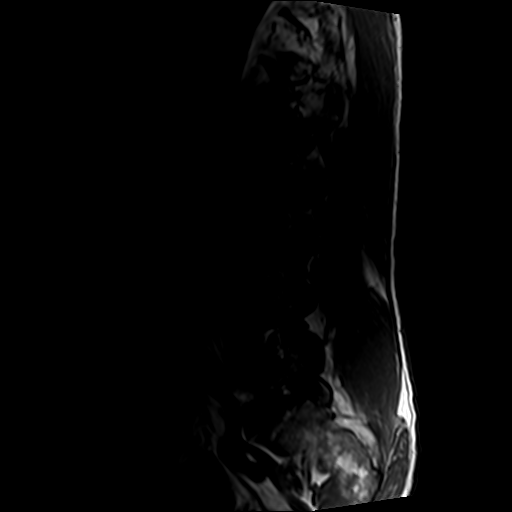
[im 15/15]
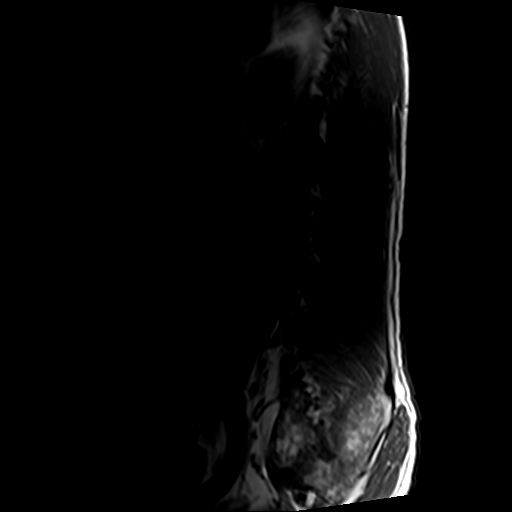

[Series 6: T2 · axial · 4.0mm · 0.70mm/px · z∈[-112,+97]mm · 9 of 39 slices shown (2 of 2)]
[im 1/39]
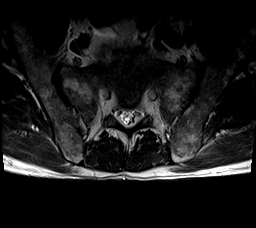
[im 6/39]
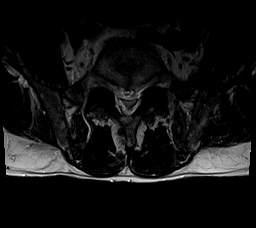
[im 11/39]
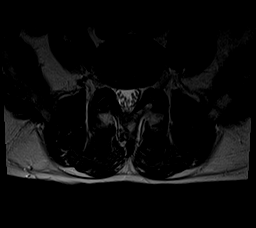
[im 17/39]
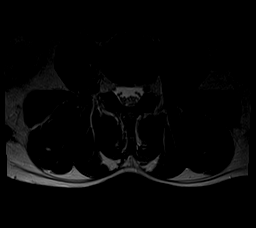
[im 20/39]
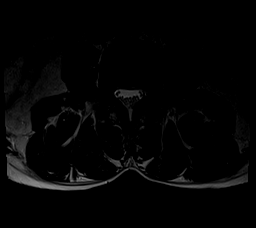
[im 22/39]
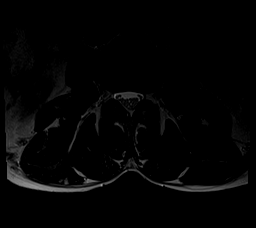
[im 28/39]
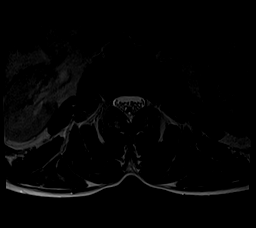
[im 33/39]
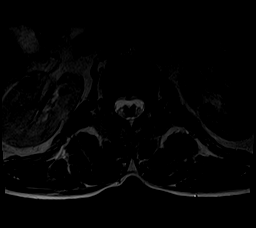
[im 39/39]
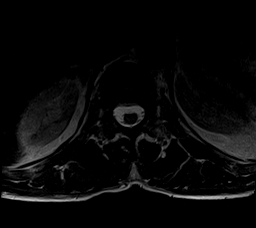

[Series 7: T1 · axial · 4.0mm · 0.35mm/px · z∈[-112,+67]mm · 4 of 39 slices shown (2 of 2)]
[im 1/39]
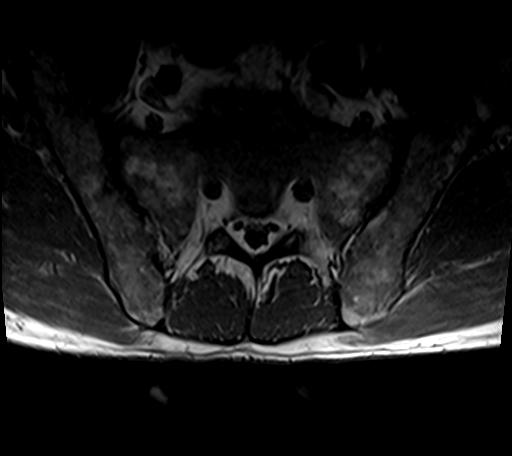
[im 6/39]
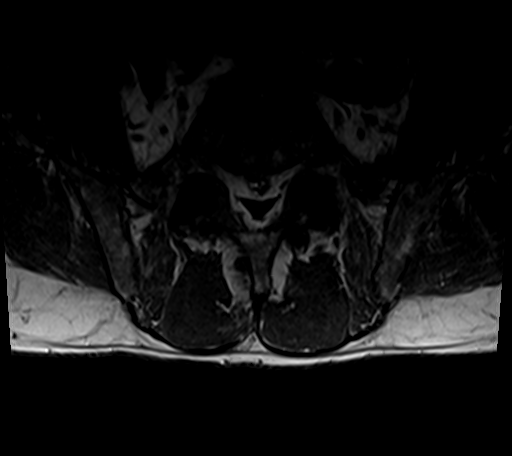
[im 20/39]
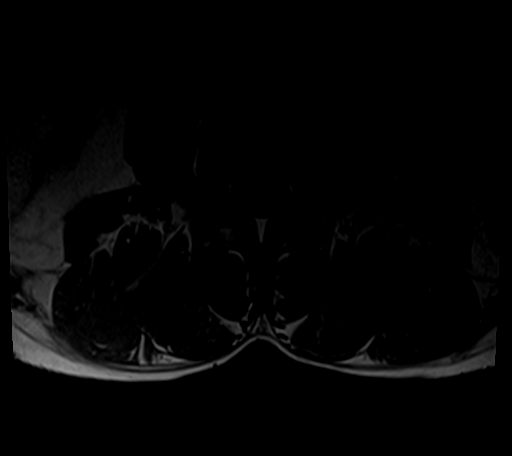
[im 33/39]
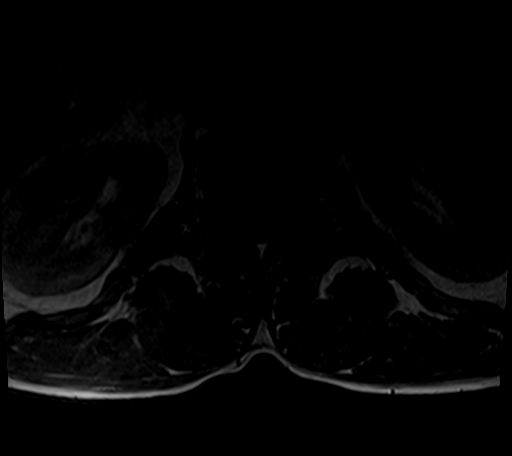

[25 of 48 positions shown; findings below may reference images not displayed]

FINDINGS: Segmentation:  Standard.

Alignment:  Minimal grade 1 L5-S1 retrolisthesis.

Vertebrae: Scattered hemangiomata versus focal fat. L5-S1 Modic type
2 endplate degenerative changes with associated bone marrow edema.
No fracture.

Conus medullaris and cauda equina: Conus extends to the L2 level.
Conus and cauda equina appear normal.

Disc levels: Endplate osteophytosis, Schmorl's node formation and
severe disc space loss at the L5-S1 level. Mild desiccation at the
remaining lumbar levels with preservation of the disc space.

L1-2: No significant disc bulge, spinal canal or neural foraminal
narrowing.

L2-3: Mild disc bulge, ligamentum flavum and bilateral facet
hypertrophy. No significant spinal canal or neural foraminal
narrowing.

L3-4: Mild disc bulge, ligamentum flavum and bilateral facet
hypertrophy. Mild bilateral neural foraminal narrowing.

L4-5: Disc bulge with small superimposed right paracentral
protrusion, ligamentum flavum and bilateral facet hypertrophy. Mild
bilateral neural foraminal narrowing. No significant spinal canal
narrowing.

L5-S1: Disc bulge, ligamentum flavum and bilateral facet
hypertrophy. Prominent epidural fat at this level. Moderate spinal
canal and bilateral neural foraminal narrowing.

Paraspinal and other soft tissues: Mild inflammation of the
prevertebral soft tissues at the L5-S1 level is reactive and
degenerative related. Bilateral renal cysts.
IMPRESSION: Advanced spondylosis at the L5-S1 level with minimal grade 1
retrolisthesis.

Moderate spinal canal and bilateral neural foraminal narrowing at
the L5-S1 level.

Mild bilateral L3-4 and L4-5 neural foraminal narrowing.

## 2020-05-31 ENCOUNTER — Telehealth: Payer: Self-pay | Admitting: Orthopaedic Surgery

## 2020-05-31 ENCOUNTER — Encounter: Payer: Self-pay | Admitting: Orthopaedic Surgery

## 2020-05-31 ENCOUNTER — Ambulatory Visit (INDEPENDENT_AMBULATORY_CARE_PROVIDER_SITE_OTHER): Payer: Medicaid Other | Admitting: Orthopaedic Surgery

## 2020-05-31 DIAGNOSIS — M545 Low back pain, unspecified: Secondary | ICD-10-CM

## 2020-05-31 DIAGNOSIS — M542 Cervicalgia: Secondary | ICD-10-CM

## 2020-05-31 MED ORDER — HYDROCODONE-ACETAMINOPHEN 5-325 MG PO TABS: 1.0000 | ORAL_TABLET | Freq: Every day | ORAL | 0 refills | Status: DC | PRN

## 2020-05-31 NOTE — Telephone Encounter (Signed)
Patient stopped by.   [facility] that was called in for him. It will need to be sent to his other pharmacy.   Call back: [phone]

## 2020-05-31 NOTE — Telephone Encounter (Signed)
Could you please send the hydrocodone to [facility] at [facility]? He is unable to get narcotic medication from [facility]. Thanks.

## 2020-05-31 NOTE — Progress Notes (Signed)
° °  Office Visit Note   Patient: Mark Hampton           Date of Birth: [birth year]           MRN: [id number] Visit Date: 05/31/2020              Requested by: Grayce Sessions, NP [address] [place],  [place] [address] PCP: Grayce Sessions, NP   Assessment & Plan: Visit Diagnoses:  1. Neck pain   2. Low back pain, unspecified back pain laterality, unspecified chronicity, unspecified whether sciatica present     Plan: Due to recent MRI findings specifically the myelomalacia at C3-4 refill is appropriately to urgently refer this patient to neurosurgery.  We will refer him to Dr. Conchita Paris.  He will follow up with [place] as needed.  Follow-Up Instructions: Return if symptoms worsen or fail to improve.   Orders:  No orders of the defined types were placed in this encounter.  No orders of the defined types were placed in this encounter.     Procedures: No procedures performed   Clinical Data: No additional findings.   Subjective: Chief Complaint  Patient presents with   Neck - Pain   Lower Back - Pain    HPI patient is a very pleasant 59 year old gentleman who comes in today to discuss MRI or results of his lumbar and cervical spine.  He has been dealing with longstanding neck and back pain.  He has also been dealing with bilateral upper and lower extremity numbness and tingling where he was seen by neurologist in the past.  He denies any significant weakness to either the upper or lower extremity.  No bowel or bladder change and no saddle paresthesias.  MRI of the lumbar spine from 05/14/2020 shows advanced spondylosis L5-S1, moderate spinal and bilateral neuroforaminal narrowing L5-S1 and mild bilateral L3-4 and L4-5 neuroforaminal narrowing.  In regards to cervical spine, he has moderate spinal canal and moderate to severe neural foraminal narrowing at C3-4 with focal myelomalacia, moderate C6-7 spinal canal narrowing and moderate right C4-5, C7-T1 and bilateral C5 7  neural foraminal narrowing.     Objective: Vital Signs: There were no vitals taken for this visit.    Ortho Exam stable neck and back exam.   Specialty Comments:  No specialty comments available.  Imaging: No new imaging   PMFS History: Patient Active Problem List   Diagnosis Date Noted   Bilateral carpal tunnel syndrome 03/29/2020   Past Medical History:  Diagnosis Date   Bilateral carpal tunnel syndrome 03/29/2020    History reviewed. No pertinent family history.  History reviewed. No pertinent surgical history. Social History   Occupational History   Not on file  Tobacco Use   Smoking status: Current Every Day Smoker   Smokeless tobacco: Never Used  Vaping Use   Vaping Use: Never used  Substance and Sexual Activity   Alcohol use: Yes   Drug use: Never   Sexual activity: Not on file

## 2020-06-01 ENCOUNTER — Other Ambulatory Visit: Payer: Self-pay | Admitting: Physician Assistant

## 2020-06-01 MED ORDER — HYDROCODONE-ACETAMINOPHEN 5-325 MG PO TABS: 1.0000 | ORAL_TABLET | Freq: Every day | ORAL | 0 refills | Status: DC | PRN

## 2020-06-01 NOTE — Addendum Note (Signed)
Addended by: Wendi Maya on: 06/01/2020 02:27 PM   Modules accepted: Orders

## 2020-06-01 NOTE — Telephone Encounter (Signed)
Just sent in

## 2020-06-02 NOTE — Telephone Encounter (Signed)
Left voicemail for patient advising. 

## 2020-06-07 ENCOUNTER — Other Ambulatory Visit: Payer: Self-pay

## 2020-06-07 ENCOUNTER — Encounter: Payer: Self-pay | Admitting: Neurology

## 2020-06-07 ENCOUNTER — Ambulatory Visit: Payer: Medicaid Other | Admitting: Neurology

## 2020-06-07 VITALS — BP 132/85 | HR 84 | Ht 68.0 in | Wt 143.0 lb

## 2020-06-07 DIAGNOSIS — G959 Disease of spinal cord, unspecified: Secondary | ICD-10-CM | POA: Diagnosis not present

## 2020-06-07 DIAGNOSIS — R269 Unspecified abnormalities of gait and mobility: Secondary | ICD-10-CM

## 2020-06-07 NOTE — Progress Notes (Signed)
Subjective:    Patient ID: Mark Hampton, male    DOB: 07-31-1961, 59 y.o.   MRN: [id number]  HPI    Interim history:   Mark Hampton is a 59 year old right-handed gentleman with an underlying medical history of vitamin D deficiency and arthritis, who Presents for follow-up consultation of his gait disorder.  The patient is accompanied by his wife today.  I first met him on 03/07/2020 at the request of his primary care nurse practitioner, at which time he reported a 4+ month history of difficulty with his walking and his balance.  He was also having other symptoms including pain in his left arm and left leg.  He was also reporting shoulder pain and neck pain and lower back pain.  I suggested we proceed with evaluation with blood work and EMG nerve conduction velocity testing. He had blood work including CK, vitamin B1, B12, folate, RPR, TSH, HTLV, rheumatoid factor, CRP, ANA with reflex, vitamin B6 and A1c, his test results were benign and we called him with the results.  EMG nerve conduction velocity testing from 03/29/2020 showed: IMPRESSION:   Nerve conduction studies done on both upper extremities shows evidence of mild bilateral carpal tunnel syndrome.  Nerve conduction study of the left lower extremity was unremarkable, there is no evidence of a generalized peripheral neuropathy.  EMG evaluation of the left upper and left lower extremities were relatively unremarkable, this study was slightly technically difficult due to very poor relaxation on the part of the patient during the entire EMG evaluation.  There is no evidence of a left cervical or left lumbosacral radiculopathy on this evaluation.   We called him with the test results.  He was advised to seek evaluation for neck and back pain with a spine specialist.  He has seen in the interim Dr. Erlinda Hong with orthopedics.  He has undergone a MRI of the lumbar spine and cervical spine without contrast on 05/14/2020 and I was able to review the  results:IMPRESSION: Advanced spondylosis at the L5-S1 level with minimal grade 1 retrolisthesis.   Moderate spinal canal and bilateral neural foraminal narrowing at the L5-S1 level.   Mild bilateral L3-4 and L4-5 neural foraminal narrowing.   IMPRESSION: Moderate spinal canal and moderate to severe neural foraminal narrowing at the C3-4 level with focal myelomalacia.   Moderate C6-7 spinal canal narrowing.   Moderate right C4-5, C7-T1 and bilateral C5-7 neural foraminal Narrowing.  He was treated symptomatically with pain medication.  Based on his MRI results he was referred to neurosurgery by his orthopedic specialist.  Today, 06/07/2020: He reports feeling about the same.  He continues to have tingling in the left upper extremity and difficulty with his walking, he fell recently but did not injure himself thankfully.  He uses a cane to ambulate.  He has met with a neurosurgeon yesterday, I was not able to review the note.  He is advised by neurosurgery that he should have surgery.  He is waiting for them to call them back with regards to more details as to his surgery to the neck.  He denies any bowel or bladder difficulty such as incontinence or retention.  He denies any speech difficulty or swallowing difficulty.  Previously:   03/07/20: (He) reports difficulty with his balance and his walking for the past 4+ months.  He denies actual vertigo symptoms such as spinning sensation, he denies nausea, double vision, lightheadedness or hearing loss, no ringing in the ears.  He has had no recurrent headaches.  He reports pain in the left shoulder, he also reports pain in the left arm and in the L leg.  He reports pain in the neck and lower back.  He has been walking with a cane for gait safety.  He has not fallen. I reviewed your office note from 11/16/2019.  He has been on meclizine as needed.  He has not found it helpful.  He presented to the emergency room on 10/17/2019 with left arm pain as  well as problem with his gait and feeling of dizziness.  I reviewed the emergency room records.  He had a brain MRI without contrast on 10/17/2019 and I reviewed the results: IMPRESSION: 1. Normal MRI appearance of the brain. No acute or focal lesion to explain the patient's symptoms. 2. Mild diffuse sinus disease. He had a left shoulder x-ray on 10/17/2019 and I reviewed the results: IMPRESSION: Negative.   He has not had any recent blood work.  He has not seen any specialist for his left shoulder pain. He smokes about 6 or 7 cigarettes/day, drinks alcohol in the form of beer, 1/day on average.  He lives with his wife.  He works as a Curator.  He denies any sudden onset of one-sided weakness or numbness or tingling or droopy face or slurring of speech.   His Past Medical History Is Significant For: Past Medical History:  Diagnosis Date  . Bilateral carpal tunnel syndrome 03/29/2020    His Past Surgical History Is Significant For: No past surgical history on file.  His Family History Is Significant For: No family history on file.  His Social History Is Significant For: Social History   Socioeconomic History  . Marital status: Married    Spouse name: Not on file  . Number of children: Not on file  . Years of education: Not on file  . Highest education level: Not on file  Occupational History  . Not on file  Tobacco Use  . Smoking status: Current Every Day Smoker  . Smokeless tobacco: Never Used  Vaping Use  . Vaping Use: Never used  Substance and Sexual Activity  . Alcohol use: Yes  . Drug use: Never  . Sexual activity: Not on file  Other Topics Concern  . Not on file  Social History Narrative  . Not on file   Social Determinants of Health   Financial Resource Strain:   . Difficulty of Paying Living Expenses:   Food Insecurity:   . Worried About Charity fundraiser in the Last Year:   . Arboriculturist in the Last Year:   Transportation Needs:   . Lexicographer (Medical):   Marland Kitchen Lack of Transportation (Non-Medical):   Physical Activity:   . Days of Exercise per Week:   . Minutes of Exercise per Session:   Stress:   . Feeling of Stress :   Social Connections:   . Frequency of Communication with Friends and Family:   . Frequency of Social Gatherings with Friends and Family:   . Attends Religious Services:   . Active Member of Clubs or Organizations:   . Attends Archivist Meetings:   Marland Kitchen Marital Status:     His Allergies Are:  No Known Allergies:   His Current Medications Are:  Outpatient Encounter Medications as of 06/07/2020  Medication Sig  . cholecalciferol (VITAMIN D3) 25 MCG (1000 UT) tablet Take 1,000 Units by mouth daily.  Marland Kitchen gabapentin (NEURONTIN) 100 MG capsule Take 1 capsule (  100 mg total) by mouth 3 (three) times daily.  Marland Kitchen HYDROcodone-acetaminophen (NORCO) 5-325 MG tablet Take 1-2 tablets by mouth daily as needed.  Marland Kitchen ibuprofen (ADVIL) 600 MG tablet Take 1 tablet (600 mg total) by mouth every 8 (eight) hours as needed.  . methocarbamol (ROBAXIN) 500 MG tablet Take 1 tablet (500 mg total) by mouth 2 (two) times daily as needed.  . predniSONE (STERAPRED UNI-PAK 21 TAB) 10 MG (21) TBPK tablet Take as directed  . vitamin E (VITAMIN E) 400 UNIT capsule Take 400 Units by mouth daily.   No facility-administered encounter medications on file as of 06/07/2020.  :  Review of Systems:  Out of a complete 14 point review of systems, all are reviewed and negative with the exception of these symptoms as listed below: Review of Systems  Neurological:       Pt is here today to discuss NCV/EMG results, states Wrist  Pain is worse, he is not using a wrist band, states neurosurgery  Recommended neck surgery        Objective:   Physical Exam Physical Examination:   Vitals:   06/07/20 1506  BP: 132/85  Pulse: 84    General Examination: The patient is a very pleasant 59 y.o. male in no acute distress. He appears  well-developed and well-nourished and well groomed.   HEENT: Normocephalic, atraumatic, pupils are equal, round and reactive to light. Extraocular tracking is good without limitation to gaze excursion or nystagmus noted. Normal smooth pursuit is noted. Hearing is grossly intact. Face is symmetric with normal facial animation and normal facial sensation. Speech is clear with no dysarthria noted. There is no hypophonia. There is no lip, neck/head, jaw or voice tremor. Neck is supple with full range of passive and active motion. There are no carotid bruits on auscultation. Oropharynx exam reveals: moderate mouth dryness, marginal dental hygiene with several missing teeth. Tongue protrudes centrally and palate elevates symmetrically.   Chest: Clear to auscultation without wheezing, rhonchi or crackles noted.  Heart: S1+S2+0, regular and normal without murmurs, rubs or gallops noted.   Abdomen: Soft, non-tender and non-distended with normal bowel sounds appreciated on auscultation.  Extremities: There is no pitting edema in the distal lower extremities bilaterally.  Skin: Warm and dry without trophic changes noted. There are no varicose veins.  Musculoskeletal: exam reveals no obvious joint deformities, tenderness or joint swelling or erythema.   Neurologically:  Mental status: The patient is awake, alert and oriented in all 4 spheres. His immediate and remote memory, attention, language skills and fund of knowledge are appropriate. There is no evidence of aphasia, agnosia, apraxia or anomia. Speech is clear with normal prosody and enunciation. Thought process is linear. Mood is normal and affect is normal.  Cranial nerves II - XII are as described above under HEENT exam. In addition: shoulder shrug is normal with equal shoulder height noted. Motor exam: Thin bulk globally, strength is 4 out of 5 in the right and weaker in the left grip, in the 4 - range.  He may have some thenar atrophy in the  left upper extremity but overall, muscle bulk is thin globally.  He has also foot dorsiflexion weakness in the left foot in the 4 out of 5 range. Reflexes are 3+ throughout, slightly brisker in the left upper extremity.  Toes are equivocal bilaterally. Fine motor skills and coordination: Mildly impaired with finger taps in the left upper extremity.   Cerebellar testing: No dysmetria or intention tremor.  Sensory  exam: intact to light touch. Gait, station and balance: He stands with mild difficulty. He walks with a walking stick but can also walk without it, he walks with insecurity, nonspecific, no shuffling, preserved arm swing noted, posture is age-appropriate.  More difficulty noted with picking up the left foot today.  Assessment and Plan:  In summary, Mark Hampton is a very pleasant 59 year old male with an underlying medical history of vitamin D deficiency and arthritis, who presents for follow-up consultation of his gait and balance problem, originally suspected to have vertigo.  He has had progressive left upper extremity pain and gait disturbance for the past 7+ months.  He was noted to have weakness and brisk reflexes.  We proceeded with blood work and EMG nerve conduction testing.  He had no telltale evidence for peripheral neuropathy or carpal tunnel syndrome, blood work was benign.  He was advised to seek consultation with a spine specialist.  He saw orthopedics and had some symptomatic pain management and eventually had a cervical and lumbar spine MRI.  This revealed multilevel degenerative changes in the cervical and lumbar spine but also focal myelomalacia at the level C3-4.  He was therefore referred to neurosurgery by his orthopedic specialist and reports that he recently saw neurosurgery yesterday.  He was advised to proceed with surgery.  He is at this juncture advised to follow-up with his neurosurgeon as planned and I would be happy to see him back as needed.  I answered all the  questions today and the patient and his wife are in agreement.

## 2020-06-07 NOTE — Patient Instructions (Signed)
I wish you good luck with your neck surgery, due to the degenerative changes and changes in your spinal cord, I believe your neck surgery is important.  I hope you do really well, I would be happy to see you back if needed in the future for any neurological problem.

## 2020-06-12 ENCOUNTER — Other Ambulatory Visit: Payer: Self-pay

## 2020-06-12 ENCOUNTER — Ambulatory Visit (INDEPENDENT_AMBULATORY_CARE_PROVIDER_SITE_OTHER): Payer: Medicaid Other | Admitting: Family Medicine

## 2020-06-12 DIAGNOSIS — G5603 Carpal tunnel syndrome, bilateral upper limbs: Secondary | ICD-10-CM

## 2020-06-12 DIAGNOSIS — M5416 Radiculopathy, lumbar region: Secondary | ICD-10-CM | POA: Diagnosis not present

## 2020-06-12 NOTE — Progress Notes (Signed)
States that he has an upcoming surgery and wants to discuss.  States that he has not been working and has questions as to how to apply for disability.  Having pain in arms and lower back.

## 2020-06-12 NOTE — Progress Notes (Signed)
Virtual Visit via Telephone Note  I connected with Zaelyn Barbary, on 06/12/2020 at 10:57 AM by telephone due to the COVID-19 pandemic and verified that I am speaking with the correct person using two identifiers.   Consent: I discussed the limitations, risks, security and privacy concerns of performing an evaluation and management service by telephone and the availability of in person appointments. I also discussed with the patient that there may be a patient responsible charge related to this service. The patient expressed understanding and agreed to proceed.   Location of Patient: Home  Location of Provider: Clinic   Persons participating in Telemedicine visit: Kingson Lohmeyer Farrington-CMA Dr. Alvis Lemmings     History of Present Illness: Mark Hampton is a 59 year old male with a history of cervical and lumbar radiculopathy seen for a telehealth visit today.   Was told he needed neck and  back surgery by Neuro surgery He has not worked since 09/2019; family members have been helping him with bills. He was denied disability.  Symptoms he has include L wrist pain,from shoulder to hand he has severe pain and  Numbness in L upper extremitiy. He is dropping things. Lower back hurts with intermediate lower extremity numbness down to his toes. He fell 2 days ago but states he broke his fall. Ambulates with a cane. He receives prescriptions from his Neurosurgeon but is unable to afford the medications.  Seen by Dr Roda Shutters of Orthopedics on 04/26/20, notes reveal mild bilateral carpal tunnel syndrome and he was Prescribed Prednisone pack which he has completed now doing Tylenol.  Past Medical History:  Diagnosis Date  . Bilateral carpal tunnel syndrome 03/29/2020   No Known Allergies  Current Outpatient Medications on File Prior to Visit  Medication Sig Dispense Refill  . gabapentin (NEURONTIN) 100 MG capsule Take 1 capsule (100 mg total) by mouth 3 (three) times daily. 90 capsule 3  .  methocarbamol (ROBAXIN) 500 MG tablet Take 1 tablet (500 mg total) by mouth 2 (two) times daily as needed. 20 tablet 0  . vitamin E (VITAMIN E) 400 UNIT capsule Take 400 Units by mouth daily.    . cholecalciferol (VITAMIN D3) 25 MCG (1000 UT) tablet Take 1,000 Units by mouth daily.    Marland Kitchen HYDROcodone-acetaminophen (NORCO) 5-325 MG tablet Take 1-2 tablets by mouth daily as needed. (Patient not taking: Reported on 06/12/2020) 10 tablet 0  . ibuprofen (ADVIL) 600 MG tablet Take 1 tablet (600 mg total) by mouth every 8 (eight) hours as needed. (Patient not taking: Reported on 06/12/2020) 90 tablet 1  . predniSONE (STERAPRED UNI-PAK 21 TAB) 10 MG (21) TBPK tablet Take as directed (Patient not taking: Reported on 06/12/2020) 21 tablet 0   No current facility-administered medications on file prior to visit.    Observations/Objective: Awake, alert, oriented x3 Not in acute distress  Assessment and Plan: 1. Bilateral carpal tunnel syndrome Uncontrolled with some component of cervical radiculopathy Unable to afford prescription analgesics Will place on LCSW schedule to assist with with obtaining legal services possibly through legal aid for his disability case.  2. Lumbar radiculopathy See #1 above   Follow Up Instructions: Keep previously scheduled appointment with PCP   I discussed the assessment and treatment plan with the patient. The patient was provided an opportunity to ask questions and all were answered. The patient agreed with the plan and demonstrated an understanding of the instructions.   The patient was advised to call back or seek an in-person evaluation if the  symptoms worsen or if the condition fails to improve as anticipated.     I provided 12 minutes total of non-face-to-face time during this encounter including median intraservice time, reviewing previous notes, investigations, ordering medications, medical decision making, coordinating care and patient verbalized understanding  at the end of the visit.     Hoy Register, MD, FAAFP. [facility] [place], [place] [phone]   06/12/2020, 10:57 AM

## 2020-06-15 ENCOUNTER — Other Ambulatory Visit: Payer: Self-pay | Admitting: Neurosurgery

## 2020-06-20 NOTE — Progress Notes (Signed)
Your procedure is scheduled on Wednesday, September 8th.  Report to [facility] Main Entrance "A" at 1:10 P.M., and check in at the Admitting office.  Call this number if you have problems the morning of surgery:  [phone]  Call [phone] if you have any questions prior to your surgery date Monday-Friday 8am-4pm   Remember:  Do not eat or drink after midnight the night before your surgery   Take these medicines the morning of surgery with A SIP OF WATER  gabapentin (NEURONTIN)  IF NEEDED: methocarbamol (ROBAXIN)   As of today, STOP taking any Aspirin (unless otherwise instructed by your surgeon) Aleve, Naproxen, Ibuprofen, Motrin, Advil, Goody's, BC's, all herbal medications, fish oil, and all vitamins.                     Do not wear jewelry.            Do not wear lotions, powders, colognes, or deodorant.            Men may shave face and neck.            Do not bring valuables to the hospital.            [facility] is not responsible for any belongings or valuables.  Do NOT Smoke (Tobacco/Vaping) or drink Alcohol 24 hours prior to your procedure If you use a CPAP at night, you may bring all equipment for your overnight stay.   Contacts, glasses, dentures or bridgework may not be worn into surgery.      For patients admitted to the hospital, discharge time will be determined by your treatment team.   Patients discharged the day of surgery will not be allowed to drive home, and someone needs to stay with them for 24 hours.  Special instructions:   [place]- Preparing For Surgery  Before surgery, you can play an important role. Because skin is not sterile, your skin needs to be as free of germs as possible. You can reduce the number of germs on your skin by washing with CHG (chlorahexidine gluconate) Soap before surgery.  CHG is an antiseptic cleaner which kills germs and bonds with the skin to continue killing germs even after washing.    Oral Hygiene is also  important to reduce your risk of infection.  Remember - BRUSH YOUR TEETH THE MORNING OF SURGERY WITH YOUR REGULAR TOOTHPASTE  Please do not use if you have an allergy to CHG or antibacterial soaps. If your skin becomes reddened/irritated stop using the CHG.  Do not shave (including legs and underarms) for at least 48 hours prior to first CHG shower. It is OK to shave your face.  Please follow these instructions carefully.   1. Shower the NIGHT BEFORE SURGERY and the MORNING OF SURGERY with CHG Soap.   2. If you chose to wash your hair, wash your hair first as usual with your normal shampoo.  3. After you shampoo, rinse your hair and body thoroughly to remove the shampoo.  4. Use CHG as you would any other liquid soap. You can apply CHG directly to the skin and wash gently with a scrungie or a clean washcloth.   5. Apply the CHG Soap to your body ONLY FROM THE NECK DOWN.  Do not use on open wounds or open sores. Avoid contact with your eyes, ears, mouth and genitals (private parts). Wash Face and genitals (private parts)  with your normal soap.   6. Wash  thoroughly, paying special attention to the area where your surgery will be performed.  7. Thoroughly rinse your body with warm water from the neck down.  8. DO NOT shower/wash with your normal soap after using and rinsing off the CHG Soap.  9. Pat yourself dry with a CLEAN TOWEL.  10. Wear CLEAN PAJAMAS to bed the night before surgery  11. Place CLEAN SHEETS on your bed the night of your first shower and DO NOT SLEEP WITH PETS.  Day of Surgery: Wear Clean/Comfortable clothing the morning of surgery Do not apply any deodorants/lotions.   Remember to brush your teeth WITH YOUR REGULAR TOOTHPASTE.   Please read over the following fact sheets that you were given.

## 2020-06-21 ENCOUNTER — Other Ambulatory Visit: Payer: Self-pay

## 2020-06-21 ENCOUNTER — Encounter (HOSPITAL_COMMUNITY): Payer: Self-pay

## 2020-06-21 ENCOUNTER — Encounter (HOSPITAL_COMMUNITY)
Admission: RE | Admit: 2020-06-21 | Discharge: 2020-06-21 | Disposition: A | Discharge status: Home / Self Care | Payer: Medicaid Other | Source: Ambulatory Visit | Attending: Neurosurgery | Admitting: Neurosurgery

## 2020-06-21 DIAGNOSIS — Z01812 Encounter for preprocedural laboratory examination: Secondary | ICD-10-CM | POA: Insufficient documentation

## 2020-06-21 LAB — CBC
HCT: 42.2 % (ref 39.0–52.0)
Hemoglobin: 14.4 g/dL (ref 13.0–17.0)
MCH: 34.3 pg — ABNORMAL HIGH (ref 26.0–34.0)
MCHC: 34.1 g/dL (ref 30.0–36.0)
MCV: 100.5 fL — ABNORMAL HIGH (ref 80.0–100.0)
Platelets: 230 10*3/uL (ref 150–400)
RBC: 4.2 MIL/uL — ABNORMAL LOW (ref 4.22–5.81)
RDW: 12.3 % (ref 11.5–15.5)
WBC: 6.6 10*3/uL (ref 4.0–10.5)
nRBC: 0 % (ref 0.0–0.2)

## 2020-06-21 LAB — COMPREHENSIVE METABOLIC PANEL
ALT: 16 U/L (ref 0–44)
AST: 27 U/L (ref 15–41)
Albumin: 3.8 g/dL (ref 3.5–5.0)
Alkaline Phosphatase: 70 U/L (ref 38–126)
Anion gap: 11 (ref 5–15)
BUN: 10 mg/dL (ref 6–20)
CO2: 23 mmol/L (ref 22–32)
Calcium: 9.4 mg/dL (ref 8.9–10.3)
Chloride: 104 mmol/L (ref 98–111)
Creatinine, Ser: 1.2 mg/dL (ref 0.61–1.24)
GFR calc Af Amer: >60 mL/min (ref >60)
GFR calc non Af Amer: >60 mL/min (ref >60)
Glucose, Bld: 94 mg/dL (ref 70–99)
Potassium: 4.3 mmol/L (ref 3.5–5.1)
Sodium: 138 mmol/L (ref 135–145)
Total Bilirubin: 0.6 mg/dL (ref 0.3–1.2)
Total Protein: 7.2 g/dL (ref 6.5–8.1)

## 2020-06-21 LAB — SURGICAL PCR SCREEN
MRSA, PCR: NEGATIVE
Staphylococcus aureus: NEGATIVE

## 2020-06-21 NOTE — Progress Notes (Signed)
PCP - Gwinda Passe NP [facility] Cardiologist - na    Chest x-ray - na EKG - 10/17/19 Stress Test - na ECHO - na Cardiac Cath - na  Sleep Study - na CPAP -   Fasting Blood Sugar - na Checks Blood Sugar _____ times a day  Blood Thinner Instructions: na Aspirin Instructions: na  ERAS Protcol - na   COVID TEST-  06/27/20   Anesthesia review:   Patient denies shortness of breath, fever, cough and chest pain at PAT appointment   All instructions explained to the patient, with a verbal understanding of the material. Patient agrees to go over the instructions while at home for a better understanding. Patient also instructed to self quarantine after being tested for COVID-19. The opportunity to ask questions was provided.

## 2020-06-21 NOTE — Progress Notes (Signed)
Your procedure is scheduled on Wednesday, September 8th.  Report to [facility] Main Entrance "A" at 1:10 P.M., and check in at the Admitting office.  Call this number if you have problems the morning of surgery:  [phone]  Call [phone] if you have any questions prior to your surgery date Monday-Friday 8am-4pm   Remember:  Do not eat or drink after midnight the night before your surgery   Take these medicines the morning of surgery with A SIP OF WATER : none     As of today, STOP taking any Aspirin (unless otherwise instructed by your surgeon) Aleve, Naproxen, Ibuprofen, Motrin, Advil, Goody's, BC's, all herbal medications, fish oil, and all vitamins.                     Do not wear jewelry.            Do not wear lotions, powders, colognes, or deodorant.            Men may shave face and neck.            Do not bring valuables to the hospital.            [facility] is not responsible for any belongings or valuables.  Do NOT Smoke (Tobacco/Vaping) or drink Alcohol 24 hours prior to your procedure If you use a CPAP at night, you may bring all equipment for your overnight stay.   Contacts, glasses, dentures or bridgework may not be worn into surgery.      For patients admitted to the hospital, discharge time will be determined by your treatment team.   Patients discharged the day of surgery will not be allowed to drive home, and someone needs to stay with them for 24 hours.  Special instructions:   [place]- Preparing For Surgery  Before surgery, you can play an important role. Because skin is not sterile, your skin needs to be as free of germs as possible. You can reduce the number of germs on your skin by washing with CHG (chlorahexidine gluconate) Soap before surgery.  CHG is an antiseptic cleaner which kills germs and bonds with the skin to continue killing germs even after washing.    Oral Hygiene is also important to reduce your risk of infection.  Remember -  BRUSH YOUR TEETH THE MORNING OF SURGERY WITH YOUR REGULAR TOOTHPASTE  Please do not use if you have an allergy to CHG or antibacterial soaps. If your skin becomes reddened/irritated stop using the CHG.  Do not shave (including legs and underarms) for at least 48 hours prior to first CHG shower. It is OK to shave your face.  Please follow these instructions carefully.   1. Shower the NIGHT BEFORE SURGERY and the MORNING OF SURGERY with CHG Soap.   2. If you chose to wash your hair, wash your hair first as usual with your normal shampoo.  3. After you shampoo, rinse your hair and body thoroughly to remove the shampoo.  4. Use CHG as you would any other liquid soap. You can apply CHG directly to the skin and wash gently with a scrungie or a clean washcloth.   5. Apply the CHG Soap to your body ONLY FROM THE NECK DOWN.  Do not use on open wounds or open sores. Avoid contact with your eyes, ears, mouth and genitals (private parts). Wash Face and genitals (private parts)  with your normal soap.   6. Wash thoroughly, paying special attention  to the area where your surgery will be performed.  7. Thoroughly rinse your body with warm water from the neck down.  8. DO NOT shower/wash with your normal soap after using and rinsing off the CHG Soap.  9. Pat yourself dry with a CLEAN TOWEL.  10. Wear CLEAN PAJAMAS to bed the night before surgery  11. Place CLEAN SHEETS on your bed the night of your first shower and DO NOT SLEEP WITH PETS.  Day of Surgery: Wear Clean/Comfortable clothing the morning of surgery Do not apply any deodorants/lotions.   Remember to brush your teeth WITH YOUR REGULAR TOOTHPASTE.   Please read over the following fact sheets that you were given.

## 2020-06-27 ENCOUNTER — Other Ambulatory Visit (HOSPITAL_COMMUNITY)
Admission: RE | Admit: 2020-06-27 | Discharge: 2020-06-27 | Disposition: A | Discharge status: Home / Self Care | Payer: Medicaid Other | Source: Ambulatory Visit | Attending: Neurosurgery | Admitting: Neurosurgery

## 2020-06-27 DIAGNOSIS — Z01812 Encounter for preprocedural laboratory examination: Secondary | ICD-10-CM | POA: Insufficient documentation

## 2020-06-27 DIAGNOSIS — Z20822 Contact with and (suspected) exposure to covid-19: Secondary | ICD-10-CM | POA: Diagnosis not present

## 2020-06-27 LAB — SARS CORONAVIRUS 2 (TAT 6-24 HRS): SARS Coronavirus 2: NEGATIVE

## 2020-06-28 ENCOUNTER — Ambulatory Visit (HOSPITAL_COMMUNITY): Payer: Medicaid Other | Admitting: Anesthesiology

## 2020-06-28 ENCOUNTER — Encounter (HOSPITAL_COMMUNITY): Payer: Self-pay | Admitting: Neurosurgery

## 2020-06-28 ENCOUNTER — Ambulatory Visit (HOSPITAL_COMMUNITY): Payer: Medicaid Other

## 2020-06-28 ENCOUNTER — Encounter (HOSPITAL_COMMUNITY)
Admission: RE | Disposition: A | Discharge status: Home / Self Care | Payer: Self-pay | Source: Home / Self Care | Attending: Neurosurgery

## 2020-06-28 ENCOUNTER — Ambulatory Visit (HOSPITAL_COMMUNITY)
Admission: RE | Admit: 2020-06-28 | Discharge: 2020-06-29 | Disposition: A | Discharge status: Home / Self Care | Payer: Medicaid Other | Attending: Neurosurgery | Admitting: Neurosurgery

## 2020-06-28 ENCOUNTER — Other Ambulatory Visit: Payer: Self-pay

## 2020-06-28 DIAGNOSIS — G959 Disease of spinal cord, unspecified: Secondary | ICD-10-CM | POA: Diagnosis present

## 2020-06-28 DIAGNOSIS — G5603 Carpal tunnel syndrome, bilateral upper limbs: Secondary | ICD-10-CM | POA: Insufficient documentation

## 2020-06-28 DIAGNOSIS — Z7952 Long term (current) use of systemic steroids: Secondary | ICD-10-CM | POA: Insufficient documentation

## 2020-06-28 DIAGNOSIS — M5001 Cervical disc disorder with myelopathy,  high cervical region: Secondary | ICD-10-CM | POA: Insufficient documentation

## 2020-06-28 DIAGNOSIS — Z791 Long term (current) use of non-steroidal anti-inflammatories (NSAID): Secondary | ICD-10-CM | POA: Insufficient documentation

## 2020-06-28 DIAGNOSIS — M4802 Spinal stenosis, cervical region: Secondary | ICD-10-CM | POA: Diagnosis not present

## 2020-06-28 DIAGNOSIS — M4712 Other spondylosis with myelopathy, cervical region: Secondary | ICD-10-CM | POA: Diagnosis not present

## 2020-06-28 DIAGNOSIS — Z79899 Other long term (current) drug therapy: Secondary | ICD-10-CM | POA: Diagnosis not present

## 2020-06-28 DIAGNOSIS — Z419 Encounter for procedure for purposes other than remedying health state, unspecified: Secondary | ICD-10-CM

## 2020-06-28 DIAGNOSIS — F17210 Nicotine dependence, cigarettes, uncomplicated: Secondary | ICD-10-CM | POA: Insufficient documentation

## 2020-06-28 HISTORY — PX: ANTERIOR CERVICAL DECOMP/DISCECTOMY FUSION: SHX1161

## 2020-06-28 IMAGING — RF DG C-ARM 1-60 MIN
1 series · 1 of 1 positions shown · non-contrast
Comparison: Cervical spine MRI [DATE]

CLINICAL DATA: Surgery, elective. Additional history provided:
C3-C4 ACDF. Provided fluoroscopy time 5 seconds, 0.34 mGy.

EXAM:
DG CERVICAL SPINE - 1 VIEW; DG C-ARM 1-60 MIN

[Series 1: run · 1 of 1 slices shown]
[im 1/1]
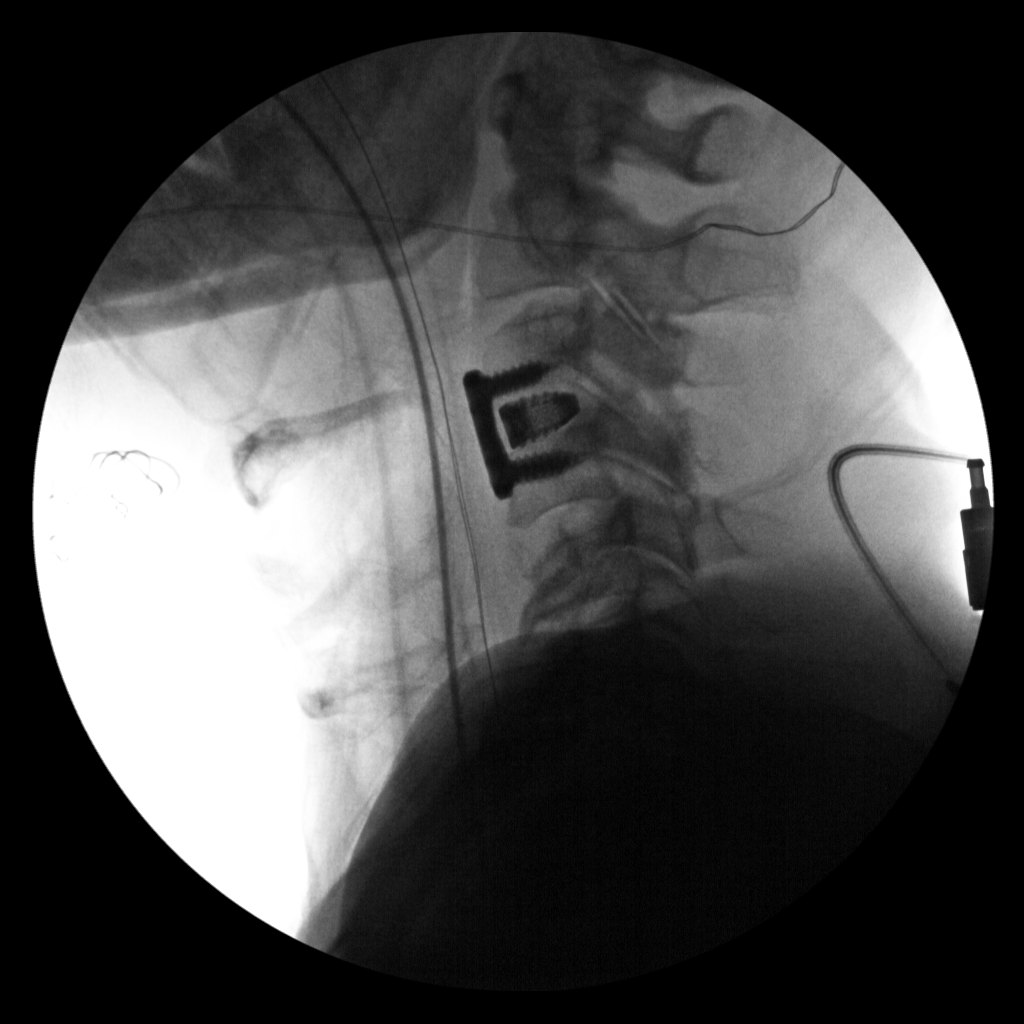

[1 of 1 positions shown; findings below may reference images not displayed]

FINDINGS: A single lateral view intraoperative fluoroscopic image of the
cervical spine is submitted. The shoulders obscure the C6 and more
caudal cervical levels. The fluoroscopic image demonstrates interval
C3-C4 ACDF. No unexpected finding. Partially visualized support
tubes.
IMPRESSION: Single lateral view intraoperative fluoroscopic image of the
cervical spine from C3-C4 ACDF as described.

## 2020-06-28 IMAGING — RF DG CERVICAL SPINE 1V
1 series · 1 of 1 positions shown · non-contrast
Comparison: Cervical spine MRI [DATE]

CLINICAL DATA: Surgery, elective. Additional history provided:
C3-C4 ACDF. Provided fluoroscopy time 5 seconds, 0.34 mGy.

EXAM:
DG CERVICAL SPINE - 1 VIEW; DG C-ARM 1-60 MIN

[Series 1: run · 1 of 1 slices shown]
[im 1/1]
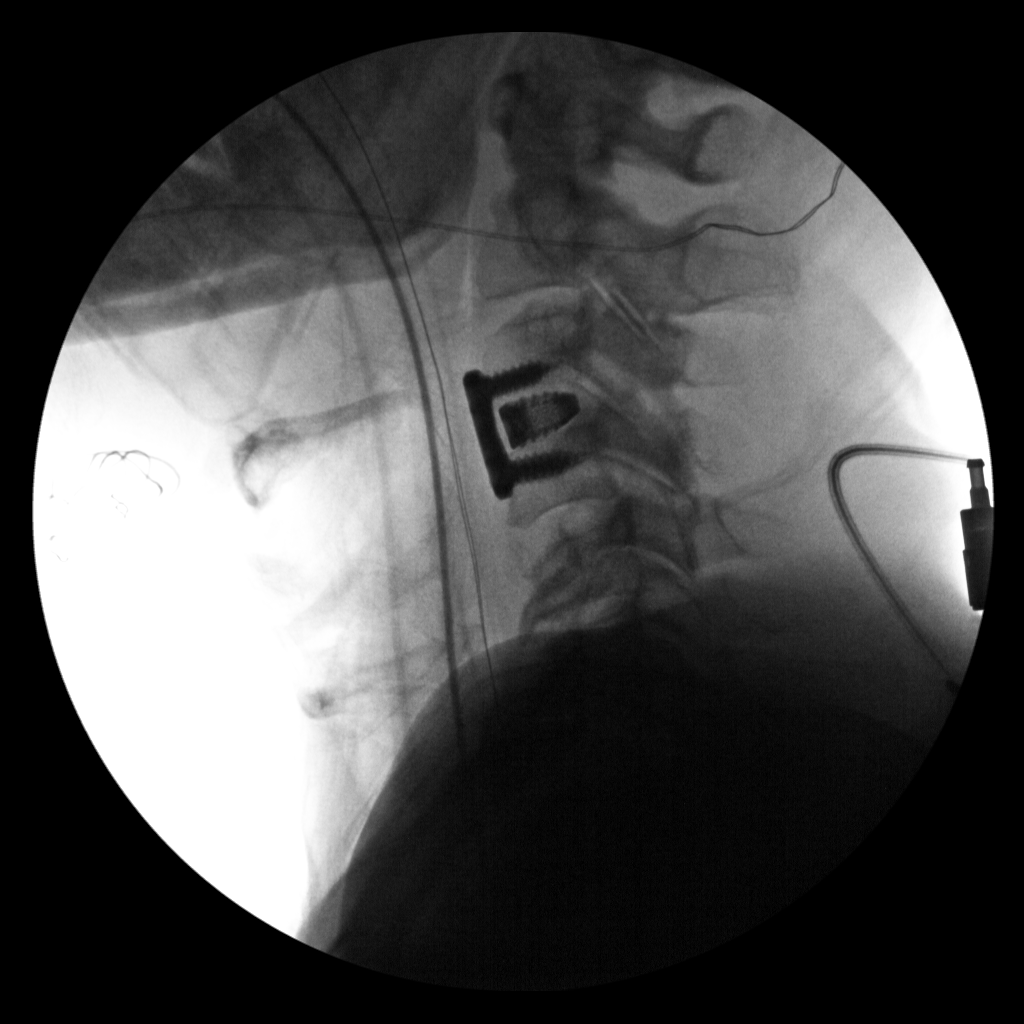

[1 of 1 positions shown; findings below may reference images not displayed]

FINDINGS: A single lateral view intraoperative fluoroscopic image of the
cervical spine is submitted. The shoulders obscure the C6 and more
caudal cervical levels. The fluoroscopic image demonstrates interval
C3-C4 ACDF. No unexpected finding. Partially visualized support
tubes.
IMPRESSION: Single lateral view intraoperative fluoroscopic image of the
cervical spine from C3-C4 ACDF as described.

## 2020-06-28 SURGERY — ANTERIOR CERVICAL DECOMPRESSION/DISCECTOMY FUSION 1 LEVEL
Anesthesia: General | Site: Neck

## 2020-06-28 MED ORDER — PROMETHAZINE HCL 25 MG/ML IJ SOLN: 6.2500 mg | INTRAMUSCULAR | Status: DC | PRN

## 2020-06-28 MED ORDER — ADULT MULTIVITAMIN W/MINERALS CH: 1.0000 | ORAL_TABLET | Freq: Every day | ORAL | Status: DC

## 2020-06-28 MED ORDER — PROPOFOL 10 MG/ML IV BOLUS
INTRAVENOUS | Status: DC | PRN
  Administered 2020-06-28: 130 mg via INTRAVENOUS

## 2020-06-28 MED ORDER — LACTATED RINGERS IV SOLN: INTRAVENOUS | Status: DC

## 2020-06-28 MED ORDER — DEXAMETHASONE SODIUM PHOSPHATE 10 MG/ML IJ SOLN: 10.0000 mg | Freq: Once | INTRAMUSCULAR | Status: DC

## 2020-06-28 MED ORDER — LIDOCAINE 2% (20 MG/ML) 5 ML SYRINGE
INTRAMUSCULAR | Status: AC
  Filled 2020-06-28: qty 5

## 2020-06-28 MED ORDER — OXYCODONE HCL 5 MG/5ML PO SOLN: 5.0000 mg | Freq: Once | ORAL | Status: DC | PRN

## 2020-06-28 MED ORDER — HYDROMORPHONE HCL 1 MG/ML IJ SOLN
0.2500 mg | INTRAMUSCULAR | Status: DC | PRN
  Administered 2020-06-28 (×4): 0.5 mg via INTRAVENOUS

## 2020-06-28 MED ORDER — FENTANYL CITRATE (PF) 250 MCG/5ML IJ SOLN
INTRAMUSCULAR | Status: AC
  Filled 2020-06-28: qty 5

## 2020-06-28 MED ORDER — VITAMIN E 180 MG (400 UNIT) PO CAPS
400.0000 [IU] | ORAL_CAPSULE | Freq: Every day | ORAL | Status: DC
  Filled 2020-06-28 (×2): qty 1

## 2020-06-28 MED ORDER — CHLORHEXIDINE GLUCONATE CLOTH 2 % EX PADS: 6.0000 | MEDICATED_PAD | Freq: Once | CUTANEOUS | Status: DC

## 2020-06-28 MED ORDER — VITAMIN D 25 MCG (1000 UNIT) PO TABS: 1000.0000 [IU] | ORAL_TABLET | Freq: Every day | ORAL | Status: DC

## 2020-06-28 MED ORDER — MULTIVITAMINS PO CAPS: 1.0000 | ORAL_CAPSULE | Freq: Every day | ORAL | Status: DC

## 2020-06-28 MED ORDER — ROCURONIUM BROMIDE 10 MG/ML (PF) SYRINGE
PREFILLED_SYRINGE | INTRAVENOUS | Status: DC | PRN
  Administered 2020-06-28: 20 mg via INTRAVENOUS
  Administered 2020-06-28: 30 mg via INTRAVENOUS
  Administered 2020-06-28: 70 mg via INTRAVENOUS

## 2020-06-28 MED ORDER — DEXAMETHASONE SODIUM PHOSPHATE 10 MG/ML IJ SOLN
INTRAMUSCULAR | Status: DC | PRN
  Administered 2020-06-28: 10 mg via INTRAVENOUS

## 2020-06-28 MED ORDER — THROMBIN 5000 UNITS EX SOLR
CUTANEOUS | Status: AC
  Filled 2020-06-28: qty 15000

## 2020-06-28 MED ORDER — MEPERIDINE HCL 25 MG/ML IJ SOLN: 6.2500 mg | INTRAMUSCULAR | Status: DC | PRN

## 2020-06-28 MED ORDER — SUGAMMADEX SODIUM 200 MG/2ML IV SOLN
INTRAVENOUS | Status: DC | PRN
  Administered 2020-06-28: 200 mg via INTRAVENOUS

## 2020-06-28 MED ORDER — HYDROCODONE-ACETAMINOPHEN 5-325 MG PO TABS
2.0000 | ORAL_TABLET | ORAL | Status: DC | PRN
  Administered 2020-06-28 – 2020-06-29 (×4): 2 via ORAL
  Filled 2020-06-28 (×4): qty 2

## 2020-06-28 MED ORDER — LIDOCAINE 2% (20 MG/ML) 5 ML SYRINGE
INTRAMUSCULAR | Status: DC | PRN
  Administered 2020-06-28: 60 mg via INTRAVENOUS

## 2020-06-28 MED ORDER — CHLORHEXIDINE GLUCONATE 0.12 % MT SOLN
OROMUCOSAL | Status: AC
  Administered 2020-06-28: 15 mL via OROMUCOSAL
  Filled 2020-06-28: qty 15

## 2020-06-28 MED ORDER — DEXAMETHASONE SODIUM PHOSPHATE 10 MG/ML IJ SOLN
INTRAMUSCULAR | Status: AC
  Filled 2020-06-28: qty 1

## 2020-06-28 MED ORDER — AMISULPRIDE (ANTIEMETIC) 5 MG/2ML IV SOLN: 10.0000 mg | Freq: Once | INTRAVENOUS | Status: DC | PRN

## 2020-06-28 MED ORDER — ALUM & MAG HYDROXIDE-SIMETH 200-200-20 MG/5ML PO SUSP: 30.0000 mL | Freq: Four times a day (QID) | ORAL | Status: DC | PRN

## 2020-06-28 MED ORDER — PANTOPRAZOLE SODIUM 40 MG IV SOLR
40.0000 mg | Freq: Every day | INTRAVENOUS | Status: DC
  Administered 2020-06-28: 40 mg via INTRAVENOUS
  Filled 2020-06-28: qty 40

## 2020-06-28 MED ORDER — ORAL CARE MOUTH RINSE: 15.0000 mL | Freq: Once | OROMUCOSAL | Status: AC

## 2020-06-28 MED ORDER — ONDANSETRON HCL 4 MG/2ML IJ SOLN: 4.0000 mg | Freq: Four times a day (QID) | INTRAMUSCULAR | Status: DC | PRN

## 2020-06-28 MED ORDER — SODIUM CHLORIDE 0.9% FLUSH: 3.0000 mL | INTRAVENOUS | Status: DC | PRN

## 2020-06-28 MED ORDER — CELECOXIB 200 MG PO CAPS: 200.0000 mg | ORAL_CAPSULE | Freq: Once | ORAL | Status: AC

## 2020-06-28 MED ORDER — SODIUM CHLORIDE 0.9 % IV SOLN: 250.0000 mL | INTRAVENOUS | Status: DC

## 2020-06-28 MED ORDER — ACETAMINOPHEN 500 MG PO TABS
ORAL_TABLET | ORAL | Status: AC
  Administered 2020-06-28: 1000 mg via ORAL
  Filled 2020-06-28: qty 2

## 2020-06-28 MED ORDER — CYCLOBENZAPRINE HCL 10 MG PO TABS
10.0000 mg | ORAL_TABLET | Freq: Three times a day (TID) | ORAL | Status: DC | PRN
  Administered 2020-06-28 – 2020-06-29 (×2): 10 mg via ORAL
  Filled 2020-06-28 (×2): qty 1

## 2020-06-28 MED ORDER — MIDAZOLAM HCL 2 MG/2ML IJ SOLN
INTRAMUSCULAR | Status: DC | PRN
  Administered 2020-06-28: 2 mg via INTRAVENOUS

## 2020-06-28 MED ORDER — CEFAZOLIN SODIUM-DEXTROSE 2-4 GM/100ML-% IV SOLN
2.0000 g | INTRAVENOUS | Status: AC
  Administered 2020-06-28: 2 g via INTRAVENOUS

## 2020-06-28 MED ORDER — HEMOSTATIC AGENTS (NO CHARGE) OPTIME
TOPICAL | Status: DC | PRN
  Administered 2020-06-28: 1 via TOPICAL

## 2020-06-28 MED ORDER — FENTANYL CITRATE (PF) 250 MCG/5ML IJ SOLN
INTRAMUSCULAR | Status: DC | PRN
Start: 2020-06-28 — End: 2020-06-28
  Administered 2020-06-28 (×3): 50 ug via INTRAVENOUS
  Administered 2020-06-28: 100 ug via INTRAVENOUS

## 2020-06-28 MED ORDER — PROPOFOL 10 MG/ML IV BOLUS
INTRAVENOUS | Status: AC
  Filled 2020-06-28: qty 20

## 2020-06-28 MED ORDER — SODIUM CHLORIDE 0.9 % IV SOLN: INTRAVENOUS | Status: DC | PRN

## 2020-06-28 MED ORDER — ACETAMINOPHEN 325 MG PO TABS: 650.0000 mg | ORAL_TABLET | ORAL | Status: DC | PRN

## 2020-06-28 MED ORDER — ONDANSETRON HCL 4 MG/2ML IJ SOLN
INTRAMUSCULAR | Status: AC
  Filled 2020-06-28: qty 2

## 2020-06-28 MED ORDER — ACETAMINOPHEN 650 MG RE SUPP: 650.0000 mg | RECTAL | Status: DC | PRN

## 2020-06-28 MED ORDER — HYDROMORPHONE HCL 1 MG/ML IJ SOLN: 0.5000 mg | INTRAMUSCULAR | Status: DC | PRN

## 2020-06-28 MED ORDER — MIDAZOLAM HCL 2 MG/2ML IJ SOLN
INTRAMUSCULAR | Status: AC
  Filled 2020-06-28: qty 2

## 2020-06-28 MED ORDER — HYDROMORPHONE HCL 1 MG/ML IJ SOLN
INTRAMUSCULAR | Status: AC
  Filled 2020-06-28: qty 1

## 2020-06-28 MED ORDER — 0.9 % SODIUM CHLORIDE (POUR BTL) OPTIME
TOPICAL | Status: DC | PRN
  Administered 2020-06-28: 1000 mL

## 2020-06-28 MED ORDER — MENTHOL 3 MG MT LOZG: 1.0000 | LOZENGE | OROMUCOSAL | Status: DC | PRN

## 2020-06-28 MED ORDER — CHLORHEXIDINE GLUCONATE 0.12 % MT SOLN: 15.0000 mL | Freq: Once | OROMUCOSAL | Status: AC

## 2020-06-28 MED ORDER — CEFAZOLIN SODIUM-DEXTROSE 2-4 GM/100ML-% IV SOLN
INTRAVENOUS | Status: AC
  Filled 2020-06-28: qty 100

## 2020-06-28 MED ORDER — ACETAMINOPHEN 500 MG PO TABS: 1000.0000 mg | ORAL_TABLET | Freq: Once | ORAL | Status: AC

## 2020-06-28 MED ORDER — SODIUM CHLORIDE 0.9% FLUSH: 3.0000 mL | Freq: Two times a day (BID) | INTRAVENOUS | Status: DC

## 2020-06-28 MED ORDER — THROMBIN 5000 UNITS EX SOLR
CUTANEOUS | Status: DC | PRN
  Administered 2020-06-28 (×2): 5000 [IU] via TOPICAL

## 2020-06-28 MED ORDER — CEFAZOLIN SODIUM-DEXTROSE 2-4 GM/100ML-% IV SOLN
2.0000 g | Freq: Three times a day (TID) | INTRAVENOUS | Status: AC
  Administered 2020-06-28 – 2020-06-29 (×2): 2 g via INTRAVENOUS
  Filled 2020-06-28 (×2): qty 100

## 2020-06-28 MED ORDER — CELECOXIB 200 MG PO CAPS
ORAL_CAPSULE | ORAL | Status: AC
  Administered 2020-06-28: 200 mg via ORAL
  Filled 2020-06-28: qty 1

## 2020-06-28 MED ORDER — THROMBIN 5000 UNITS EX SOLR: OROMUCOSAL | Status: DC | PRN

## 2020-06-28 MED ORDER — OXYCODONE HCL 5 MG PO TABS: 5.0000 mg | ORAL_TABLET | Freq: Once | ORAL | Status: DC | PRN

## 2020-06-28 MED ORDER — PHENYLEPHRINE 40 MCG/ML (10ML) SYRINGE FOR IV PUSH (FOR BLOOD PRESSURE SUPPORT)
PREFILLED_SYRINGE | INTRAVENOUS | Status: DC | PRN
  Administered 2020-06-28 (×2): 40 ug via INTRAVENOUS

## 2020-06-28 MED ORDER — ONDANSETRON HCL 4 MG/2ML IJ SOLN
INTRAMUSCULAR | Status: DC | PRN
  Administered 2020-06-28: 4 mg via INTRAVENOUS

## 2020-06-28 MED ORDER — ROCURONIUM BROMIDE 10 MG/ML (PF) SYRINGE
PREFILLED_SYRINGE | INTRAVENOUS | Status: AC
  Filled 2020-06-28: qty 10

## 2020-06-28 MED ORDER — PHENOL 1.4 % MT LIQD: 1.0000 | OROMUCOSAL | Status: DC | PRN

## 2020-06-28 MED ORDER — ONDANSETRON HCL 4 MG PO TABS: 4.0000 mg | ORAL_TABLET | Freq: Four times a day (QID) | ORAL | Status: DC | PRN

## 2020-06-28 SURGICAL SUPPLY — 64 items
BAG DECANTER FOR FLEXI CONT (MISCELLANEOUS) ×3 IMPLANT
BAND RUBBER #18 3X1/16 STRL (MISCELLANEOUS) ×6 IMPLANT
BASKET BONE COLLECTION (BASKET) ×3 IMPLANT
BENZOIN TINCTURE PRP APPL 2/3 (GAUZE/BANDAGES/DRESSINGS) ×3 IMPLANT
BIT DRILL NEURO 2X3.1 SFT TUCH (MISCELLANEOUS) ×1 IMPLANT
BONE VIVIGEN FORMABLE 1.3CC (Bone Implant) ×3 IMPLANT
BUR MATCHSTICK NEURO 3.0 LAGG (BURR) ×3 IMPLANT
CANISTER SUCT 3000ML PPV (MISCELLANEOUS) ×3 IMPLANT
CARTRIDGE OIL MAESTRO DRILL (MISCELLANEOUS) ×1 IMPLANT
CLOSURE WOUND 1/2 X4 (GAUZE/BANDAGES/DRESSINGS) ×1
COVER WAND RF STERILE (DRAPES) IMPLANT
DERMABOND ADVANCED (GAUZE/BANDAGES/DRESSINGS) ×2
DERMABOND ADVANCED .7 DNX12 (GAUZE/BANDAGES/DRESSINGS) ×1 IMPLANT
DIFFUSER DRILL AIR PNEUMATIC (MISCELLANEOUS) ×3 IMPLANT
DRAPE C-ARM 42X72 X-RAY (DRAPES) ×6 IMPLANT
DRAPE LAPAROTOMY 100X72 PEDS (DRAPES) ×3 IMPLANT
DRAPE MICROSCOPE LEICA (MISCELLANEOUS) ×3 IMPLANT
DRILL NEURO 2X3.1 SOFT TOUCH (MISCELLANEOUS) ×3
DRSG OPSITE POSTOP 4X6 (GAUZE/BANDAGES/DRESSINGS) ×3 IMPLANT
DURAPREP 6ML APPLICATOR 50/CS (WOUND CARE) ×3 IMPLANT
ELECT COATED BLADE 2.86 ST (ELECTRODE) ×3 IMPLANT
ELECT REM PT RETURN 9FT ADLT (ELECTROSURGICAL) ×3
ELECTRODE REM PT RTRN 9FT ADLT (ELECTROSURGICAL) ×1 IMPLANT
GAUZE 4X4 16PLY RFD (DISPOSABLE) IMPLANT
GAUZE SPONGE 4X4 12PLY STRL (GAUZE/BANDAGES/DRESSINGS) IMPLANT
GLOVE BIO SURGEON STRL SZ7 (GLOVE) ×3 IMPLANT
GLOVE BIO SURGEON STRL SZ8 (GLOVE) ×3 IMPLANT
GLOVE BIOGEL PI IND STRL 7.0 (GLOVE) ×1 IMPLANT
GLOVE BIOGEL PI IND STRL 7.5 (GLOVE) ×1 IMPLANT
GLOVE BIOGEL PI IND STRL 8 (GLOVE) ×2 IMPLANT
GLOVE BIOGEL PI INDICATOR 7.0 (GLOVE) ×2
GLOVE BIOGEL PI INDICATOR 7.5 (GLOVE) ×2
GLOVE BIOGEL PI INDICATOR 8 (GLOVE) ×4
GLOVE ECLIPSE 7.5 STRL STRAW (GLOVE) ×9 IMPLANT
GLOVE EXAM NITRILE XL STR (GLOVE) IMPLANT
GLOVE INDICATOR 8.5 STRL (GLOVE) ×3 IMPLANT
GOWN STRL REUS W/ TWL LRG LVL3 (GOWN DISPOSABLE) ×1 IMPLANT
GOWN STRL REUS W/ TWL XL LVL3 (GOWN DISPOSABLE) ×1 IMPLANT
GOWN STRL REUS W/TWL 2XL LVL3 (GOWN DISPOSABLE) ×3 IMPLANT
GOWN STRL REUS W/TWL LRG LVL3 (GOWN DISPOSABLE) ×2
GOWN STRL REUS W/TWL XL LVL3 (GOWN DISPOSABLE) ×2
HALTER HD/CHIN CERV TRACTION D (MISCELLANEOUS) ×3 IMPLANT
HEMOSTAT POWDER KIT SURGIFOAM (HEMOSTASIS) ×3 IMPLANT
KIT BASIN OR (CUSTOM PROCEDURE TRAY) ×3 IMPLANT
KIT TURNOVER KIT B (KITS) ×3 IMPLANT
NEEDLE HYPO 18GX1.5 BLUNT FILL (NEEDLE) IMPLANT
NEEDLE SPNL 20GX3.5 QUINCKE YW (NEEDLE) ×3 IMPLANT
NS IRRIG 1000ML POUR BTL (IV SOLUTION) ×3 IMPLANT
OIL CARTRIDGE MAESTRO DRILL (MISCELLANEOUS) ×3
PACK LAMINECTOMY NEURO (CUSTOM PROCEDURE TRAY) ×3 IMPLANT
PAD ARMBOARD 7.5X6 YLW CONV (MISCELLANEOUS) ×9 IMPLANT
PIN DISTRACTION 14MM (PIN) IMPLANT
PLATE ANT CERV XTEND 1 LV 14 (Plate) ×3 IMPLANT
SCREW VAR 4.2 XD SELF DRILL 14 (Screw) ×12 IMPLANT
SPACER C HEDRON 12X14 7M 7D (Spacer) ×3 IMPLANT
SPONGE INTESTINAL PEANUT (DISPOSABLE) ×3 IMPLANT
SPONGE SURGIFOAM ABS GEL SZ50 (HEMOSTASIS) ×3 IMPLANT
STRIP CLOSURE SKIN 1/2X4 (GAUZE/BANDAGES/DRESSINGS) ×2 IMPLANT
SUT VIC AB 3-0 SH 8-18 (SUTURE) ×3 IMPLANT
SUT VICRYL 4-0 PS2 18IN ABS (SUTURE) ×3 IMPLANT
TAPE CLOTH 4X10 WHT NS (GAUZE/BANDAGES/DRESSINGS) IMPLANT
TOWEL GREEN STERILE (TOWEL DISPOSABLE) ×3 IMPLANT
TOWEL GREEN STERILE FF (TOWEL DISPOSABLE) ×3 IMPLANT
WATER STERILE IRR 1000ML POUR (IV SOLUTION) ×3 IMPLANT

## 2020-06-28 NOTE — Anesthesia Preprocedure Evaluation (Addendum)
Anesthesia Evaluation  Patient identified by MRN, date of birth, ID band Patient awake    Reviewed: Allergy & Precautions, NPO status , Patient's Chart, lab work & pertinent test results, Unable to perform ROS - Chart review only  Airway Mallampati: II  TM Distance: >3 FB Neck ROM: Full    Dental  (+) Poor Dentition, Missing, Loose   Pulmonary neg pulmonary ROS, Current Smoker and Patient abstained from smoking.,    Pulmonary exam normal breath sounds clear to auscultation       Cardiovascular negative cardio ROS Normal cardiovascular exam Rhythm:Regular Rate:Normal     Neuro/Psych negative neurological ROS  negative psych ROS   GI/Hepatic negative GI ROS, Neg liver ROS,   Endo/Other  negative endocrine ROS  Renal/GU negative Renal ROS  negative genitourinary   Musculoskeletal negative musculoskeletal ROS (+)   Abdominal   Peds negative pediatric ROS (+)  Hematology negative hematology ROS (+)   Anesthesia Other Findings   Reproductive/Obstetrics negative OB ROS                           Anesthesia Physical Anesthesia Plan  ASA: II  Anesthesia Plan: General   Post-op Pain Management:    Induction: Intravenous  PONV Risk Score and Plan: 1 and Ondansetron and Treatment may vary due to age or medical condition  Airway Management Planned: Oral ETT  Additional Equipment: None  Intra-op Plan:   Post-operative Plan: Extubation in OR  Informed Consent: I have reviewed the patients History and Physical, chart, labs and discussed the procedure including the risks, benefits and alternatives for the proposed anesthesia with the patient or authorized representative who has indicated his/her understanding and acceptance.     Dental advisory given  Plan Discussed with: CRNA  Anesthesia Plan Comments:       Anesthesia Quick Evaluation

## 2020-06-28 NOTE — H&P (Signed)
Mark Hampton is an 59 y.o. male.   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  Due to patient's progression of clinical syndrome imaging findings and failed conservative treatment I recommended anterior cervical discectomy and fusion at C3-4.  I extensively went over the risks and benefits of that operation with him as well as perioperative course expectations of outcome alternatives of surgery and he understood and agreed to proceed forward.  Past Medical History:  Diagnosis Date  . Bilateral carpal tunnel syndrome 03/29/2020    Past Surgical History:  Procedure Laterality Date  . ABCESS DRAINAGE     in the jaw area    History reviewed. No pertinent family history. Social History:  reports that he has been smoking cigarettes. He has a 5.00 pack-year smoking history. He has never used smokeless tobacco. He reports current alcohol use of about 1.0 - 2.0 standard drink of alcohol per week. He reports that he does not use drugs.  Allergies: No Known Allergies  Medications Prior to Admission  Medication Sig Dispense Refill  . cholecalciferol (VITAMIN D3) 25 MCG (1000 UT) tablet Take 1,000 Units by mouth daily.    . Multiple Vitamin (MULTIVITAMIN) capsule Take 1 capsule by mouth daily.    . vitamin E (VITAMIN E) 400 UNIT capsule Take 400 Units by mouth daily.    Marland Kitchen gabapentin (NEURONTIN) 100 MG capsule Take 1 capsule (100 mg total) by mouth 3 (three) times daily. (Patient not taking: Reported on 06/21/2020) 90 capsule 3  . HYDROcodone-acetaminophen (NORCO) 5-325 MG tablet Take 1-2 tablets by mouth daily as needed. (Patient not taking: Reported on 06/12/2020) 10 tablet 0  . ibuprofen (ADVIL) 600 MG tablet Take 1 tablet (600 mg total) by mouth every 8 (eight) hours as needed.  (Patient not taking: Reported on 06/12/2020) 90 tablet 1  . methocarbamol (ROBAXIN) 500 MG tablet Take 1 tablet (500 mg total) by mouth 2 (two) times daily as needed. (Patient not taking: Reported on 06/21/2020) 20 tablet 0  . predniSONE (STERAPRED UNI-PAK 21 TAB) 10 MG (21) TBPK tablet Take as directed (Patient not taking: Reported on 06/12/2020) 21 tablet 0    Results for orders placed or performed during the hospital encounter of 06/27/20 (from the past 48 hour(s))  SARS CORONAVIRUS 2 (TAT 6-24 HRS) Nasopharyngeal Nasopharyngeal Swab     Status: None   Collection Time: 06/27/20  9:06 AM   Specimen: Nasopharyngeal Swab  Result Value Ref Range   SARS Coronavirus 2 NEGATIVE NEGATIVE    Comment: (NOTE) SARS-CoV-2 target nucleic acids are NOT DETECTED.  The SARS-CoV-2 RNA is generally detectable in upper and lower respiratory specimens during the acute phase of infection. Negative results do not preclude SARS-CoV-2 infection, do not rule out co-infections with other pathogens, and should not be used as the sole basis for treatment or other patient management decisions. Negative results must be combined with clinical observations, patient history, and epidemiological information. The expected result is Negative.  Fact Sheet for Patients: HairSlick.no  Fact Sheet for Healthcare Providers: quierodirigir.com  This test is not yet approved or cleared by the [place] FDA and  has been authorized for detection and/or diagnosis of SARS-CoV-2 by FDA under an Emergency Use Authorization (EUA). This EUA will remain  in effect (meaning this test can be used) for the duration  of the COVID-19 declaration under Se ction 564(b)(1) of the Act, 21 U.S.C. section 360bbb-3(b)(1), unless the authorization is terminated or revoked sooner.  Performed at [facility], [address] [place], [place] [address]    No results found.  Review  of Systems  Musculoskeletal: Positive for neck pain.  Neurological: Positive for weakness and numbness.    Blood pressure 130/90, pulse 84, temperature 97.9 F (36.6 C), temperature source Temporal, resp. rate 18, height 5\' 8"  (1.727 m), weight 64.2 kg, SpO2 100 %. Physical Exam HENT:     Head: Normocephalic.     Nose: Nose normal.     Mouth/Throat:     Mouth: Mucous membranes are moist.  Eyes:     Pupils: Pupils are equal, round, and reactive to light.  Cardiovascular:     Rate and Rhythm: Normal rate.  Pulmonary:     Effort: Pulmonary effort is normal.  Abdominal:     General: Abdomen is flat.  Musculoskeletal:        General: Normal range of motion.     Cervical back: Normal range of motion.  Skin:    General: Skin is warm.  Neurological:     Mental Status: He is alert.     Comments: Patient is awake and alert diffuse weakness hand intrinsics are 4 to 4+ out of 5 triceps of 4 to 4+ out of 5 deltoid seem to be 5 out of 5 lower extremities are 5 out of 5 but patient is myelopathic and hyperreflexic      Assessment/Plan 58 year old presents for ACDF C3-4  67, MD 06/28/2020, 2:38 PM

## 2020-06-28 NOTE — Anesthesia Procedure Notes (Signed)
Procedure Name: Intubation Date/Time: 06/28/2020 3:17 PM Performed by: Kathryne Hitch, CRNA Pre-anesthesia Checklist: Patient identified, Emergency Drugs available, Suction available and Patient being monitored Patient Re-evaluated:Patient Re-evaluated prior to induction Oxygen Delivery Method: Circle system utilized Preoxygenation: Pre-oxygenation with 100% oxygen Induction Type: IV induction and Cricoid Pressure applied Ventilation: Mask ventilation without difficulty and Oral airway inserted - appropriate to patient size Laryngoscope Size: Mac and 4 Grade View: Grade I Tube type: Oral Tube size: 7.5 mm Number of attempts: 1 Airway Equipment and Method: Stylet and Oral airway Placement Confirmation: ETT inserted through vocal cords under direct vision,  positive ETCO2 and breath sounds checked- equal and bilateral Secured at: 24 cm Tube secured with: Tape Dental Injury: Teeth and Oropharynx as per pre-operative assessment  Comments: Placed by Sabra Heck MD

## 2020-06-28 NOTE — Op Note (Signed)
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  Work-up revealed cord compression from disc herniation and spondylosis at C3-4 with signal change within his cord.  Due to patient progression of clinical syndrome imaging findings and failed conservative treatment I recommended ACDF at C3-4 I extensively went over the risks and benefits of that procedure with him as well as perioperative course expectations of outcome and alternatives of surgery and he understood and agreed to proceed forward.  Operative procedure: Patient was brought into the OR was due to general anesthesia positioned supine the neck in slight extension 5 pounds halter traction.  The right side was next prepped and draped in routine sterile fashion preoperative x-ray localized the appropriate level so a curvilinear incision was made just off the midline to the anterior border of the sternocleidomastoid and superficial layer platysma was dissected out divided longitudinally the avascular plane between the sternomastoid and strap muscle was developed down to the prevertebral fascia and prevertebral fascia was dissected away with Kitners.  Intraoperative x-ray confirmed identification appropriate level so annulotomy was made with a 15 blade scalpel to marked the disc base longus goes reflected laterally and self-retaining retractor was placed.  The space was then  incised further anterior osteophytes were bitten off with a 3 Miller Kerrison punch disc base was then drilled down capturing the bone shavings and mucus trap.  Then under microscopic lamination further drilling down the disc base allowed identification removal of the large posterior spurs coming off the 3 and C4 vertebral body endplates.  Marked uncinate hypertrophy was causing severe compression as well as a large spur primarily left of center this was all aggressively removed decompressing the central canal.  Marching laterally both C4 pedicles were identified both C4 nerve roots were skeletonized flush the pedicle.  At the end discectomy there is no further stenosis either centrally or foraminally.  Endplates were then scraped and prepared I selected a 7 mm parallel cage packed with locally harvested autograft mixed with vivigen and inserted it 1 to 2 mm deep to anteverted byline then I selected a 14 mm globus extend plate all screws with excellent purchase lock mechanisms were engaged wounds and copiously irrigated to Kassim states was maintained postop fluoroscopy confirmed good position of the implants and the wound was closed in layers with interrupted Vicryl in the platysma and a running 4 subcuticular.  Dermabond benzoin Steri-Strips and a sterile dressing was applied patient recovery in stable condition.  At the end the case all needle count sponge counts were correct.

## 2020-06-28 NOTE — Transfer of Care (Signed)
Immediate Anesthesia Transfer of Care Note  Patient: Mark Hampton  Procedure(s) Performed: Anterior Cervical Decompression Fusion - Cervical three-four (N/A Neck)  Patient Location: PACU  Anesthesia Type:General  Level of Consciousness: awake, alert  and oriented  Airway & Oxygen Therapy: Patient Spontanous Breathing and Patient connected to face mask oxygen  Post-op Assessment: Report given to RN, Post -op Vital signs reviewed and stable and Patient moving all extremities X 4  Post vital signs: Reviewed and stable  Last Vitals:  Vitals Value Taken Time  BP 157/92 06/28/20 1710  Temp 36.3 C 06/28/20 1710  Pulse 83 06/28/20 1713  Resp 23 06/28/20 1713  SpO2 95 % 06/28/20 1713  Vitals shown include unvalidated device data.  Last Pain:  Vitals:   06/28/20 1710  TempSrc:   PainSc: (P) 0-No pain      Patients Stated Pain Goal: 3 (06/28/20 1346)  Complications: No complications documented.

## 2020-06-29 ENCOUNTER — Encounter (HOSPITAL_COMMUNITY): Payer: Self-pay | Admitting: Neurosurgery

## 2020-06-29 DIAGNOSIS — M5001 Cervical disc disorder with myelopathy,  high cervical region: Secondary | ICD-10-CM | POA: Diagnosis not present

## 2020-06-29 MED ORDER — PANTOPRAZOLE SODIUM 40 MG PO TBEC: 40.0000 mg | DELAYED_RELEASE_TABLET | Freq: Every day | ORAL | Status: DC

## 2020-06-29 MED ORDER — CYCLOBENZAPRINE HCL 10 MG PO TABS: 10.0000 mg | ORAL_TABLET | Freq: Three times a day (TID) | ORAL | 0 refills | Status: DC | PRN

## 2020-06-29 MED ORDER — TAMSULOSIN HCL 0.4 MG PO CAPS
0.4000 mg | ORAL_CAPSULE | Freq: Every day | ORAL | Status: DC
  Administered 2020-06-29: 0.4 mg via ORAL
  Filled 2020-06-29: qty 1

## 2020-06-29 MED ORDER — HYDROCODONE-ACETAMINOPHEN 5-325 MG PO TABS
2.0000 | ORAL_TABLET | ORAL | 0 refills | Status: DC | PRN
Start: 2020-06-29 — End: 2021-11-13

## 2020-06-29 NOTE — Plan of Care (Signed)
Patient alert and oriented, mae's well, voiding adequate amount of urine, swallowing without difficulty, no c/o pain at time of discharge. Patient discharged home with family. Script and discharged instructions given to patient. Patient and family stated understanding of instructions given. All DME's sent home with patient and spouse.  Patient has an appointment with Dr. Wynetta Emery

## 2020-06-29 NOTE — Evaluation (Signed)
Occupational Therapy Evaluation Patient Details Name: Mark Hampton MRN: [id number] DOB: 11/21/1960 Today's Date: 06/29/2020    History of Present Illness Pt is a 59 y/o male with PMH of B carpal tunnel syndrome, presenting with progressive worsening neck and UE pain, numbness/tingling. S/P ACDF C3-4.    Clinical Impression   PTA patient independent with ADLs (given increased time) and mobility using cane.  Admitted for above and limited by problem list below, including L UE pain/numbess and tingling, R UE numbness and tingling in fingertips, B UE decreased FMC, impaired balance and generalized weakness.  He was educated on cervical precautions, brace mgmt, ADl compensatory techniques, DME, recommendations, energy conservation and safety. Good recall of cervical precautions but requires min cueing to adhere functionally (speficially with posture). Completing mobility and transfers with min guard and ADls with min assist to supervision.  Believe he will benefit from further OT services acutely, but anticipate no further needs after dc home, as he will have good support from spouse.  Will follow acutely.      Follow Up Recommendations  No OT follow up;Supervision/Assistance - 24 hour (inital)    Equipment Recommendations  3 in 1 bedside commode;Tub/shower bench    Recommendations for Other Services       Precautions / Restrictions Precautions Precautions: Fall;Cervical Precaution Booklet Issued: Yes (comment) Precaution Comments: reviewed with pt, good recall  Required Braces or Orthoses: Cervical Brace Cervical Brace: Soft collar;At all times Restrictions Weight Bearing Restrictions: No      Mobility Bed Mobility               General bed mobility comments: OOB in recliner upon entry   Transfers Overall transfer level: Needs assistance Equipment used: Rolling walker (2 wheeled) Transfers: Sit to/from Stand Sit to Stand: Min guard         General transfer comment:  min guard for technique/posture     Balance Overall balance assessment: Needs assistance Sitting-balance support: No upper extremity supported;Feet supported Sitting balance-Leahy Scale: Good     Standing balance support: Bilateral upper extremity supported;No upper extremity supported;During functional activity Standing balance-Leahy Scale: Fair Standing balance comment: relies on BUE support dynamically but able to engage in ADls statically with min guard to close supervision                            ADL either performed or assessed with clinical judgement   ADL Overall ADL's : Needs assistance/impaired     Grooming: Supervision/safety;Standing   Upper Body Bathing: Set up;Sitting   Lower Body Bathing: Supervison/ safety;Sit to/from stand   Upper Body Dressing : Minimal assistance;Sitting   Lower Body Dressing: Min guard;Sit to/from stand   Toilet Transfer: Min guard;Ambulation;RW   Toileting- Architect and Hygiene: Min guard;Sit to/from stand   Tub/ Shower Transfer: Tub transfer;Min guard;Ambulation;Shower Dealer Details (indicate cue type and reason): simulated in room, educated on reverse step technique; discussed tub bench and pt reports preference to bench for safety  Functional mobility during ADLs: Min guard;Rolling walker General ADL Comments: pt educated on precautions, ADL compensatory techniques and recommendations     Vision   Vision Assessment?: No apparent visual deficits     Perception     Praxis      Pertinent Vitals/Pain Pain Assessment: Faces Faces Pain Scale: Hurts little more Pain Location: L UE  Pain Descriptors / Indicators: Discomfort;Grimacing Pain Intervention(s): Limited activity within patient's tolerance;Monitored during session;Repositioned  Hand Dominance Left   Extremity/Trunk Assessment Upper Extremity Assessment Upper Extremity Assessment: RUE deficits/detail;LUE  deficits/detail RUE Deficits / Details: numbness in fingertips, otherwise WFL within precautions (grossly 3+/5 MMT)  RUE Sensation: decreased light touch RUE Coordination: decreased fine motor LUE Deficits / Details: limited by pain in UE, numb/tingling entire UE, WFL within precautions (grossly MMT 3+/5)  LUE Sensation: decreased light touch LUE Coordination: decreased fine motor   Lower Extremity Assessment Lower Extremity Assessment: Defer to PT evaluation   Cervical / Trunk Assessment Cervical / Trunk Assessment: Other exceptions Cervical / Trunk Exceptions: s/p cervical surgery   Communication Communication Communication: No difficulties   Cognition Arousal/Alertness: Awake/alert Behavior During Therapy: WFL for tasks assessed/performed Overall Cognitive Status: Within Functional Limits for tasks assessed                                 General Comments: good recall of precautions and engages appropriately   General Comments  reports spouse can assist with ADLs as needed, agreeable to have her nearby for showering, reviewed energy conservation and safety      Exercises     Shoulder Instructions      Home Living Family/patient expects to be discharged to:: Private residence Living Arrangements: Spouse/significant other Available Help at Discharge: Family;Available PRN/intermittently (reports will be alone until noon) Type of Home: House       Home Layout: Two level;Bed/bath upstairs     Bathroom Shower/Tub: Chief Strategy Officer: Standard     Home Equipment: Cane - single point          Prior Functioning/Environment Level of Independence: Independent with assistive device(s)        Comments: used cane for mobility, increased time for ADLs; not driving or working; reports several falls over the last few months         OT Problem List: Decreased strength;Decreased activity tolerance;Impaired balance (sitting and/or  standing);Decreased coordination;Decreased safety awareness;Decreased knowledge of use of DME or AE;Impaired sensation;Pain      OT Treatment/Interventions: Self-care/ADL training;DME and/or AE instruction;Therapeutic activities;Patient/family education;Balance training;Neuromuscular education    OT Goals(Current goals can be found in the care plan section) Acute Rehab OT Goals Patient Stated Goal: home  OT Goal Formulation: With patient Time For Goal Achievement: 07/13/20 Potential to Achieve Goals: Good  OT Frequency: Min 2X/week   Barriers to D/C:            Co-evaluation              AM-PAC OT "6 Clicks" Daily Activity     Outcome Measure Help from another person eating meals?: A Little Help from another person taking care of personal grooming?: A Little Help from another person toileting, which includes using toliet, bedpan, or urinal?: A Little Help from another person bathing (including washing, rinsing, drying)?: A Little Help from another person to put on and taking off regular upper body clothing?: A Little Help from another person to put on and taking off regular lower body clothing?: A Little 6 Click Score: 18   End of Session Equipment Utilized During Treatment: Gait belt;Rolling walker;Cervical collar Nurse Communication: Mobility status  Activity Tolerance: Patient tolerated treatment well Patient left: in chair;with call bell/phone within reach  OT Visit Diagnosis: Other abnormalities of gait and mobility (R26.89);Muscle weakness (generalized) (M62.81);Pain Pain - Right/Left: Left Pain - part of body: Arm;Shoulder  Time: 0277-4128 OT Time Calculation (min): 15 min Charges:  OT General Charges $OT Visit: 1 Visit OT Evaluation $OT Eval Low Complexity: 1 Low  Barry Brunner, OT Acute Rehabilitation Services Pager [phone] Office [phone]   Chancy Milroy 06/29/2020, 10:34 AM

## 2020-06-29 NOTE — Discharge Summary (Signed)
Physician Discharge Summary  Patient ID: Mark Hampton MRN: [id number] DOB/AGE: 12-26-1960 59 y.o.  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 The patient tolerated the procedure well and was taken to the recovery room and then to the floor in stable condition. The hospital course was routine. There were no complications. The wound remained clean dry and intact. Pt had appropriate neck soreness. No complaints of arm pain or new N/T/W. The patient remained afebrile with stable vital signs, and tolerated a regular diet. The patient continued to increase activities, and pain was well controlled with oral pain medications.   Consults: None  Significant Diagnostic Studies:  Results for orders placed or performed during the hospital encounter of 06/27/20  SARS CORONAVIRUS 2 (TAT 6-24 HRS) Nasopharyngeal Nasopharyngeal Swab   Specimen: Nasopharyngeal Swab  Result Value Ref Range   SARS Coronavirus 2 NEGATIVE NEGATIVE    DG Cervical Spine 1 View  Result Date: 06/28/2020 CLINICAL DATA:  Surgery, elective. Additional history provided: C3-C4 ACDF. Provided fluoroscopy time 5 seconds, 0.34 mGy. EXAM: DG CERVICAL SPINE - 1 VIEW; DG C-ARM 1-60 MIN COMPARISON:  Cervical spine MRI 05/14/2020 FINDINGS: A single lateral view intraoperative fluoroscopic image of the cervical spine is submitted. The shoulders obscure the C6 and more caudal cervical levels. The fluoroscopic image demonstrates interval C3-C4 ACDF. No unexpected finding. Partially visualized support tubes. IMPRESSION: Single lateral view intraoperative fluoroscopic image of the cervical spine from C3-C4 ACDF as described. Electronically Signed   By: Jackey Loge DO   On:  06/28/2020 16:58   DG C-Arm 1-60 Min  Result Date: 06/28/2020 CLINICAL DATA:  Surgery, elective. Additional history provided: C3-C4 ACDF. Provided fluoroscopy time 5 seconds, 0.34 mGy. EXAM: DG CERVICAL SPINE - 1 VIEW; DG C-ARM 1-60 MIN COMPARISON:  Cervical spine MRI 05/14/2020 FINDINGS: A single lateral view intraoperative fluoroscopic image of the cervical spine is submitted. The shoulders obscure the C6 and more caudal cervical levels. The fluoroscopic image demonstrates interval C3-C4 ACDF. No unexpected finding. Partially visualized support tubes. IMPRESSION: Single lateral view intraoperative fluoroscopic image of the cervical spine from C3-C4 ACDF as described. Electronically Signed   By: Jackey Loge DO   On: 06/28/2020 16:58    Antibiotics:  Anti-infectives (From admission, onward)   Start     Dose/Rate Route Frequency Ordered Stop   06/28/20 2330  ceFAZolin (ANCEF) IVPB 2g/100 mL premix        2 g 200 mL/hr over 30 Minutes Intravenous Every 8 hours 06/28/20 1852 06/29/20 0645   06/28/20 1601  bacitracin 50,000 Units in sodium chloride 0.9 % 500 mL irrigation  Status:  Discontinued          As needed 06/28/20 1601 06/28/20 1705   06/28/20 1415  ceFAZolin (ANCEF) IVPB 2g/100 mL premix        2 g 200 mL/hr over 30 Minutes Intravenous On call to O.R. 06/28/20 1319 06/28/20 1556   06/28/20 1327  ceFAZolin (ANCEF) 2-4 GM/100ML-% IVPB       Note to Pharmacy: Clovis Cao   : cabinet override      06/28/20 1327 06/28/20 1527      Discharge Exam: Blood pressure 103/69, pulse 91, temperature 98.7 F (37.1 C), temperature source Oral, resp. rate 16, height 5'  8" (1.727 m), weight 64.2 kg, SpO2 96 %. Neurologic: Grossly normal Ambulating and voiding well, incision CDI  Discharge Medications:   Allergies as of 06/29/2020   No Known Allergies     Medication List    STOP taking these medications   methocarbamol 500 MG tablet Commonly known as: Robaxin     TAKE these medications    cholecalciferol 25 MCG (1000 UNIT) tablet Commonly known as: VITAMIN D3 Take 1,000 Units by mouth daily.   cyclobenzaprine 10 MG tablet Commonly known as: FLEXERIL Take 1 tablet (10 mg total) by mouth 3 (three) times daily as needed for muscle spasms.   gabapentin 100 MG capsule Commonly known as: NEURONTIN Take 1 capsule (100 mg total) by mouth 3 (three) times daily.   HYDROcodone-acetaminophen 5-325 MG tablet Commonly known as: NORCO/VICODIN Take 2 tablets by mouth every 4 (four) hours as needed for severe pain ((score 7 to 10)). What changed:   how much to take  when to take this  reasons to take this   ibuprofen 600 MG tablet Commonly known as: ADVIL Take 1 tablet (600 mg total) by mouth every 8 (eight) hours as needed.   multivitamin capsule Take 1 capsule by mouth daily.   predniSONE 10 MG (21) Tbpk tablet Commonly known as: STERAPRED UNI-PAK 21 TAB Take as directed   vitamin E 180 MG (400 UNITS) capsule Generic drug: vitamin E Take 400 Units by mouth daily.       Disposition: home   Final Dx: ACDF C3-4  Discharge Instructions     Remove dressing in 72 hours   Complete by: As directed    Call MD for:  difficulty breathing, headache or visual disturbances   Complete by: As directed    Call MD for:  extreme fatigue   Complete by: As directed    Call MD for:  hives   Complete by: As directed    Call MD for:  persistant dizziness or light-headedness   Complete by: As directed    Call MD for:  persistant nausea and vomiting   Complete by: As directed    Call MD for:  redness, tenderness, or signs of infection (pain, swelling, redness, odor or green/yellow discharge around incision site)   Complete by: As directed    Call MD for:  severe uncontrolled pain   Complete by: As directed    Call MD for:  temperature >100.4   Complete by: As directed    Diet - low sodium heart healthy   Complete by: As directed    Driving Restrictions   Complete by: As  directed    No driving for 2 weeks, no riding in the car for 1 week   Increase activity slowly   Complete by: As directed    Lifting restrictions   Complete by: As directed    No lifting more than 8 lbs         Signed: Tiana Loft Jordon Bourquin 06/29/2020, 8:04 AM

## 2020-06-29 NOTE — Anesthesia Postprocedure Evaluation (Signed)
Anesthesia Post Note  Patient: Mark Hampton  Procedure(s) Performed: Anterior Cervical Decompression Fusion - Cervical three-four (N/A Neck)     Patient location during evaluation: PACU Anesthesia Type: General Level of consciousness: awake and alert Pain management: pain level controlled Vital Signs Assessment: post-procedure vital signs reviewed and stable Respiratory status: spontaneous breathing, nonlabored ventilation, respiratory function stable and patient connected to nasal cannula oxygen Cardiovascular status: blood pressure returned to baseline and stable Postop Assessment: no apparent nausea or vomiting Anesthetic complications: no   No complications documented.  Last Vitals:  Vitals:   06/29/20 0412 06/29/20 0743  BP: 114/86 103/69  Pulse: 86 91  Resp: 18 16  Temp: 36.9 C 37.1 C  SpO2: 96% 96%    Last Pain:  Vitals:   06/29/20 0743  TempSrc: Oral  PainSc:                  Tachina Spoonemore

## 2020-08-21 ENCOUNTER — Ambulatory Visit (INDEPENDENT_AMBULATORY_CARE_PROVIDER_SITE_OTHER): Payer: Medicaid Other | Admitting: Primary Care

## 2020-08-21 ENCOUNTER — Other Ambulatory Visit: Payer: Self-pay

## 2020-08-21 ENCOUNTER — Encounter (INDEPENDENT_AMBULATORY_CARE_PROVIDER_SITE_OTHER): Payer: Self-pay | Admitting: Primary Care

## 2020-08-21 VITALS — BP 129/88 | HR 93 | Temp 97.5°F | Ht 68.0 in | Wt 140.8 lb

## 2020-08-21 DIAGNOSIS — Z720 Tobacco use: Secondary | ICD-10-CM

## 2020-08-21 DIAGNOSIS — M21611 Bunion of right foot: Secondary | ICD-10-CM | POA: Diagnosis not present

## 2020-08-21 DIAGNOSIS — Z1211 Encounter for screening for malignant neoplasm of colon: Secondary | ICD-10-CM | POA: Diagnosis not present

## 2020-08-21 DIAGNOSIS — M2061 Acquired deformities of toe(s), unspecified, right foot: Secondary | ICD-10-CM

## 2020-08-21 NOTE — Progress Notes (Signed)
Acute Office Visit  Subjective:    Patient ID: Mark Hampton, male    DOB: 1961-03-09, 59 y.o.   MRN: [id number]  Chief Complaint  Patient presents with   Foot Pain    right foot     HPI Mark Hampton is a 59 year old male who presents today for an acute visit right foot pain causing second toe overlapping fourth deformity.  Trying to wear shoes aggravates and irritates and rub against increasing pain and making it difficult to walk  Past Medical History:  Diagnosis Date   Bilateral carpal tunnel syndrome 03/29/2020    Past Surgical History:  Procedure Laterality Date   ABCESS DRAINAGE     in the jaw area   ANTERIOR CERVICAL DECOMP/DISCECTOMY FUSION N/A 06/28/2020   Procedure: Anterior Cervical Decompression Fusion - Cervical three-four;  Surgeon: Donalee Citrin, MD;  Location: [facility] OR;  Service: Neurosurgery;  Laterality: N/A;    No family history on file.  Social History   Socioeconomic History   Marital status: Married    Spouse name: Not on file   Number of children: Not on file   Years of education: Not on file   Highest education level: Not on file  Occupational History   Not on file  Tobacco Use   Smoking status: Current Every Day Smoker    Packs/day: 0.25    Years: 20.00    Pack years: 5.00    Types: Cigarettes   Smokeless tobacco: Never Used  Vaping Use   Vaping Use: Never used  Substance and Sexual Activity   Alcohol use: Yes    Alcohol/week: 1.0 - 2.0 standard drink    Types: 1 - 2 Cans of beer per week   Drug use: Never   Sexual activity: Not on file  Other Topics Concern   Not on file  Social History Narrative   Not on file   Social Determinants of Health   Financial Resource Strain:    Difficulty of Paying Living Expenses: Not on file  Food Insecurity:    Worried About Running Out of Food in the Last Year: Not on file   Ran Out of Food in the Last Year: Not on file  Transportation Needs:    Lack of Transportation  (Medical): Not on file   Lack of Transportation (Non-Medical): Not on file  Physical Activity:    Days of Exercise per Week: Not on file   Minutes of Exercise per Session: Not on file  Stress:    Feeling of Stress : Not on file  Social Connections:    Frequency of Communication with Friends and Family: Not on file   Frequency of Social Gatherings with Friends and Family: Not on file   Attends Religious Services: Not on file   Active Member of Clubs or Organizations: Not on file   Attends Banker Meetings: Not on file   Marital Status: Not on file  Intimate Partner Violence:    Fear of Current or Ex-Partner: Not on file   Emotionally Abused: Not on file   Physically Abused: Not on file   Sexually Abused: Not on file    Outpatient Medications Prior to Visit  Medication Sig Dispense Refill   cholecalciferol (VITAMIN D3) 25 MCG (1000 UT) tablet Take 1,000 Units by mouth daily.     HYDROcodone-acetaminophen (NORCO/VICODIN) 5-325 MG tablet Take 2 tablets by mouth every 4 (four) hours as needed for severe pain ((score 7 to 10)). 30 tablet 0   methocarbamol (  ROBAXIN) 500 MG tablet Take 500 mg by mouth as needed.     Multiple Vitamin (MULTIVITAMIN) capsule Take 1 capsule by mouth daily.     vitamin E (VITAMIN E) 400 UNIT capsule Take 400 Units by mouth daily.     cyclobenzaprine (FLEXERIL) 10 MG tablet Take 1 tablet (10 mg total) by mouth 3 (three) times daily as needed for muscle spasms. 30 tablet 0   gabapentin (NEURONTIN) 100 MG capsule Take 1 capsule (100 mg total) by mouth 3 (three) times daily. (Patient not taking: Reported on 06/21/2020) 90 capsule 3   ibuprofen (ADVIL) 600 MG tablet Take 1 tablet (600 mg total) by mouth every 8 (eight) hours as needed. (Patient not taking: Reported on 06/12/2020) 90 tablet 1   predniSONE (STERAPRED UNI-PAK 21 TAB) 10 MG (21) TBPK tablet Take as directed (Patient not taking: Reported on 06/12/2020) 21 tablet 0   No  facility-administered medications prior to visit.    No Known Allergies  Review of Systems  Musculoskeletal:       Right foot pain with bunion and over lapping 2nd toe   All other systems reviewed and are negative.      Objective:    BP 129/88 (BP Location: Right Arm, Patient Position: Sitting, Cuff Size: Normal)    Pulse 93    Temp (!) 97.5 F (36.4 C) (Temporal)    Ht 5\' 8"  (1.727 m)    Wt 140 lb 12.8 oz (63.9 kg)    SpO2 97%    BMI 21.41 kg/m  Physical Exam Vitals reviewed.  Constitutional:      Appearance: Normal appearance.     Comments: Thin frame  HENT:     Head: Normocephalic.     Right Ear: Tympanic membrane normal.     Left Ear: Tympanic membrane normal.     Nose: Nose normal.  Eyes:     Extraocular Movements: Extraocular movements intact.  Cardiovascular:     Rate and Rhythm: Normal rate and regular rhythm.  Pulmonary:     Effort: Pulmonary effort is normal.     Breath sounds: Normal breath sounds.  Abdominal:     General: Abdomen is flat. Bowel sounds are normal.  Musculoskeletal:        General: Normal range of motion.     Cervical back: Normal range of motion.     Comments: Right foot pain bunion and overlapping big toe causing discoloration to 2 toe  Skin:    General: Skin is warm and dry.  Neurological:     Mental Status: He is alert and oriented to person, place, and time.  Psychiatric:        Mood and Affect: Mood normal.        Behavior: Behavior normal.        Thought Content: Thought content normal.        Judgment: Judgment normal.     BP 129/88 (BP Location: Right Arm, Patient Position: Sitting, Cuff Size: Normal)    Pulse 93    Temp (!) 97.5 F (36.4 C) (Temporal)    Ht 5\' 8"  (1.727 m)    Wt 140 lb 12.8 oz (63.9 kg)    SpO2 97%    BMI 21.41 kg/m  Wt Readings from Last 3 Encounters:  08/21/20 140 lb 12.8 oz (63.9 kg)  06/28/20 141 lb 9.6 oz (64.2 kg)  06/21/20 141 lb 9.6 oz (64.2 kg)    Health Maintenance Due  Topic Date Due    Hepatitis  C Screening  Never done   HIV Screening  Never done   COLONOSCOPY  Never done    There are no preventive care reminders to display for this patient.   Lab Results  Component Value Date   TSH 0.912 03/07/2020   Lab Results  Component Value Date   WBC 6.6 06/21/2020   HGB 14.4 06/21/2020   HCT 42.2 06/21/2020   MCV 100.5 (H) 06/21/2020   PLT 230 06/21/2020   Lab Results  Component Value Date   NA 138 06/21/2020   K 4.3 06/21/2020   CO2 23 06/21/2020   GLUCOSE 94 06/21/2020   BUN 10 06/21/2020   CREATININE 1.20 06/21/2020   BILITOT 0.6 06/21/2020   ALKPHOS 70 06/21/2020   AST 27 06/21/2020   ALT 16 06/21/2020   PROT 7.2 06/21/2020   ALBUMIN 3.8 06/21/2020   CALCIUM 9.4 06/21/2020   ANIONGAP 11 06/21/2020   No results found for: CHOL No results found for: HDL No results found for: LDLCALC No results found for: TRIG No results found for: [facility] Lab Results  Component Value Date   HGBA1C 5.3 03/07/2020       Assessment & Plan:  Eydan was seen today for foot pain.  Diagnoses and all orders for this visit:  Toe deformity, acquired, right Right foot pain bunion and overlapping big toe causing discoloration to 2 toe    -     Ambulatory referral to Orthopedic Surgery  Bunion of great toe of right foot -     Ambulatory referral to Orthopedic Surgery  Colon cancer screening Referral to gastrology-preventive care health maintenance and care gap  Tobacco abuse He is aware of   Increased risk for lung cancer and other respiratory diseases recommend cessation. Example HTN, CVD and respiratory complication This will be reminded at each clinical visit.    Grayce Sessions, NP

## 2020-08-21 NOTE — Progress Notes (Signed)
Pt believes foot was broken about 5 years ago and that it healed wrong Pain is in big toe; over lapping over the second toe

## 2020-08-21 NOTE — Patient Instructions (Signed)
Colorectal Cancer Screening  Colorectal cancer screening is a group of tests that are used to check for colorectal cancer before symptoms develop. Colorectal refers to the colon and rectum. The colon and rectum are located at the end of the digestive tract and carry bowel movements out of the body. Who should have screening? All adults starting at age 61 until age 75 should have screening. Your health care provider may recommend screening at age 45. You will have tests every 1-10 years, depending on your results and the type of screening test. You may have screening tests starting at an earlier age, or more frequently than other people, if you have any of the following risk factors:  A personal or family history of colorectal cancer or abnormal growths (polyps).  Inflammatory bowel disease, such as ulcerative colitis or Crohn's disease.  A history of having radiation treatment to the abdomen or pelvic area for cancer.  Colorectal cancer symptoms, such as changes in bowel habits or blood in your stool.  A type of colon cancer syndrome that is passed from parent to child (hereditary), such as: ? Lynch syndrome. ? Familial adenomatous polyposis. ? Turcot syndrome. ? Peutz-Jeghers syndrome. Screening recommendations for adults who are 75-85 years old vary depending on health. How is screening done? There are several types of colorectal screening tests. You may have one or more of the following:  Guaiac-based fecal occult blood testing. For this test, a stool (feces) sample is checked for hidden (occult) blood, which could be a sign of colorectal cancer.  Fecal immunochemical test (FIT). For this test, a stool sample is checked for blood, which could be a sign of colorectal cancer.  Stool DNA test. For this test, a stool sample is checked for blood and changes in DNA that could lead to colorectal cancer.  Sigmoidoscopy. During this test, a thin, flexible tube with a camera on the end  (sigmoidoscope) is used to examine the rectum and the lower colon.  Colonoscopy. During this test, a long, flexible tube with a camera on the end (colonoscope) is used to examine the entire colon and rectum. With a colonoscopy, it is possible to take a sample of tissue (biopsy) and remove small polyps during the test.  Virtual colonoscopy. Instead of a colonoscope, this type of colonoscopy uses X-rays (CT scan) and computers to produce images of the colon and rectum. What are the benefits of screening? Screening reduces your risk for colorectal cancer and can help identify cancer at an early stage, when the cancer can be removed or treated more easily. It is common for polyps to form in the lining of the colon, especially as you age. These polyps may be cancerous or become cancerous over time. Screening can identify these polyps. What are the risks of screening? Each screening test may have different risks.  Stool sample tests have fewer risks than other types of screening tests. However, you may need more tests to confirm results from a stool sample test.  Screening tests that involve X-rays expose you to low levels of radiation, which may slightly increase your cancer risk. The benefit of detecting cancer outweighs the slight increase in risk.  Screening tests such as sigmoidoscopy and colonoscopy may place you at risk for bleeding, intestinal damage, infection, or a reaction to medicines given during the exam. Talk with your health care provider to understand your risk for colorectal cancer and to make a screening plan that is right for you. Questions to ask your health care   provider  When should I start colorectal cancer screening?  What is my risk for colorectal cancer?  How often do I need screening?  Which screening tests do I need?  How do I get my test results?  What do my results mean? Where to find more information Learn more about colorectal cancer screening from:  The  American Cancer Society: www.cancer.org  The National Cancer Institute: www.cancer.gov Summary  Colorectal cancer screening is a group of tests used to check for colorectal cancer before symptoms develop.  Screening reduces your risk for colorectal cancer and can help identify cancer at an early stage, when the cancer can be removed or treated more easily.  All adults starting at age 61 until age 75 should have screening. Your health care provider may recommend screening at age 45.  You may have screening tests starting at an earlier age, or more frequently than other people, if you have certain risk factors.  Talk with your health care provider to understand your risk for colorectal cancer and to make a screening plan that is right for you. This information is not intended to replace advice given to you by your health care provider. Make sure you discuss any questions you have with your health care provider. Document Revised: 01/27/2019 Document Reviewed: 07/09/2017 Elsevier Patient Education  2020 Elsevier Inc.  

## 2020-08-29 ENCOUNTER — Other Ambulatory Visit: Payer: Self-pay

## 2020-08-29 ENCOUNTER — Ambulatory Visit (INDEPENDENT_AMBULATORY_CARE_PROVIDER_SITE_OTHER): Payer: Medicaid Other

## 2020-08-29 ENCOUNTER — Ambulatory Visit: Payer: Self-pay

## 2020-08-29 ENCOUNTER — Encounter: Payer: Self-pay | Admitting: Orthopaedic Surgery

## 2020-08-29 ENCOUNTER — Ambulatory Visit (INDEPENDENT_AMBULATORY_CARE_PROVIDER_SITE_OTHER): Payer: Medicaid Other | Admitting: Orthopaedic Surgery

## 2020-08-29 DIAGNOSIS — M79672 Pain in left foot: Secondary | ICD-10-CM

## 2020-08-29 DIAGNOSIS — M79671 Pain in right foot: Secondary | ICD-10-CM | POA: Diagnosis not present

## 2020-08-29 NOTE — Progress Notes (Signed)
Office Visit Note   Patient: Mark Hampton           Date of Birth: 1961/09/17           MRN: [id number] Visit Date: 08/29/2020              Requested by: Grayce Sessions, NP [address] [place],  [place] [address] PCP: Grayce Sessions, NP   Assessment & Plan: Visit Diagnoses:  1. Pain in right foot   2. 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 Based on our discussion of treatment options patient would like to explore surgical correction of the right hallux valgus deformity. We will send the patient to Dr. Lajoyce Corners for surgical consultation. Follow-up as needed.  Follow-Up Instructions: Return if symptoms worsen or fail to improve.   Orders:  Orders Placed This Encounter  Procedures  . XR Foot Complete Right  . XR Foot Complete Left   No orders of the defined types were placed in this encounter.     Procedures: No procedures performed   Clinical Data: No additional findings.   Subjective: Chief Complaint  Patient presents with  . Left Foot - Pain  . Right Foot - Pain    Mark Hampton is a 59 year old gentleman comes in for evaluation of bilateral foot pain. He is complaining of a symptomatic right hallux valgus deformity with painful shoewear. He has had this for 20 years. He is also having mild left great toe pain. He had surgery on his cervical spine by Dr. Wynetta Emery earlier this year. He ambulates with a cane.   Review of Systems  Constitutional: Negative.   All other systems reviewed and are negative.    Objective: Vital Signs: There were no vitals taken for this visit.  Physical Exam Vitals and nursing note reviewed.  Constitutional:      Appearance: He is well-developed.  Pulmonary:     Effort: Pulmonary effort is normal.  Abdominal:     Palpations: Abdomen is soft.  Skin:    General: Skin is warm.  Neurological:     Mental Status: He is alert and oriented to person, place, and time.   Psychiatric:        Behavior: Behavior normal.        Thought Content: Thought content normal.        Judgment: Judgment normal.     Ortho Exam Right foot shows a flexible severe hallux valgus deformity. There is laterally deviated second toe as well.  Left foot shows positive grind test of the left great toe MTP joint. Specialty Comments:  No specialty comments available.  Imaging: XR Foot Complete Left  Result Date: 08/29/2020 Hallux rigidus.  XR Foot Complete Right  Result Date: 08/29/2020 Severe hallux valgus deformity. There are degenerative changes of the IP joints of the second toe as well.    PMFS History: Patient Active Problem List   Diagnosis Date Noted  . Myelopathy (HCC) 06/28/2020  . Bilateral carpal tunnel syndrome 03/29/2020   Past Medical History:  Diagnosis Date  . Bilateral carpal tunnel syndrome 03/29/2020    History reviewed. No pertinent family history.  Past Surgical History:  Procedure Laterality Date  . ABCESS DRAINAGE     in the jaw area  . ANTERIOR CERVICAL DECOMP/DISCECTOMY FUSION N/A 06/28/2020   Procedure: Anterior Cervical Decompression Fusion - Cervical three-four;  Surgeon: Donalee Citrin, MD;  Location: [facility] OR;  Service: Neurosurgery;  Laterality: N/A;   Social History   Occupational History  . Not on file  Tobacco Use  . Smoking status: Current Every Day Smoker    Packs/day: 0.25    Years: 20.00    Pack years: 5.00    Types: Cigarettes  . Smokeless tobacco: Never Used  Vaping Use  . Vaping Use: Never used  Substance and Sexual Activity  . Alcohol use: Yes    Alcohol/week: 1.0 - 2.0 standard drink    Types: 1 - 2 Cans of beer per week  . Drug use: Never  . Sexual activity: Not on file

## 2020-08-31 ENCOUNTER — Other Ambulatory Visit: Payer: Self-pay

## 2020-08-31 DIAGNOSIS — M79671 Pain in right foot: Secondary | ICD-10-CM

## 2020-09-04 ENCOUNTER — Encounter: Payer: Self-pay | Admitting: Orthopedic Surgery

## 2020-09-04 ENCOUNTER — Ambulatory Visit (INDEPENDENT_AMBULATORY_CARE_PROVIDER_SITE_OTHER): Payer: Medicaid Other | Admitting: Orthopedic Surgery

## 2020-09-04 DIAGNOSIS — M2011 Hallux valgus (acquired), right foot: Secondary | ICD-10-CM

## 2020-09-04 DIAGNOSIS — M2022 Hallux rigidus, left foot: Secondary | ICD-10-CM | POA: Diagnosis not present

## 2020-09-04 NOTE — Progress Notes (Signed)
Office Visit Note   Patient: Mark Hampton           Date of Birth: [birth year]           MRN: [id number] Visit Date: 09/04/2020              Requested by: Tarry Kos, MD [address] [place],  [place] [address] PCP: Grayce Sessions, NP  Chief Complaint  Patient presents with  . Left Foot - Pain  . Right Foot - Pain      HPI: Patient is a 60 year old gentleman who was seen in referral from Dr. Roda Shutters. Patient complains of bunion deformity on the right toe with a right toe overlapping the second toe and complains of pain in the left great toe with decreased range of motion.  Assessment & Plan: Visit Diagnoses:  1. Hallux valgus (acquired), right foot   2. Hallux rigidus, left foot     Plan: Patient states his symptoms are worse from the right foot and would like to proceed with bunion surgery would plan for a chevron osteotomy and Akin osteotomy for the great toe risks and benefits of surgery were discussed including infection nonhealing the wound need for additional surgery. Patient states he understands wished to proceed at this time we will set this up for outpatient surgery discussed that he would need to be off his foot for about a month after surgery.  Follow-Up Instructions: Return if symptoms worsen or fail to improve.   Ortho Exam  Patient is alert, oriented, no adenopathy, well-dressed, normal affect, normal respiratory effort. Examination patient has a good dorsalis pedis pulse bilaterally examination of the left foot he has hallux rigidus with bony spurs dorsally and dorsiflexion of 20 degrees plantarflexion of 20 degrees. Radiograph shows collapse of the joint space of the great toe MTP joint. Examination the right foot patient has a good dorsalis pedis pulse with overlapping of the great toe over the second toe with hallux valgus deformity. Radiograph shows a moderate hallux valgus deformity of the great toe with subluxation of the PIP joint of the second toe  with valgus alignment of the second toe. Patient does not have pain beneath the second and third metatarsal heads but does have a long second and third metatarsal.  Imaging: No results found. No images are attached to the encounter.  Labs: Lab Results  Component Value Date   HGBA1C 5.3 03/07/2020   CRP 3 03/07/2020   REPTSTATUS 08/25/2016 FINAL 08/23/2016   CULT NO GROWTH 08/23/2016     Lab Results  Component Value Date   ALBUMIN 3.8 06/21/2020   ALBUMIN 3.3 (L) 08/23/2016    No results found for: MG No results found for: VD25OH  No results found for: PREALBUMIN CBC EXTENDED Latest Ref Rng & Units 06/21/2020 10/17/2019 08/23/2016  WBC 4.0 - 10.5 K/uL 6.6 5.9 -  RBC 4.22 - 5.81 MIL/uL 4.20(L) 3.99(L) -  HGB 13.0 - 17.0 g/dL 77.9 39.0 30.0  HCT 39 - 52 % 42.2 39.7 45.0  PLT 150 - 400 K/uL 230 234 -  NEUTROABS 1.7 - 7.7 K/uL - - -  LYMPHSABS 0.7 - 4.0 K/uL - - -     There is no height or weight on file to calculate BMI.  Orders:  No orders of the defined types were placed in this encounter.  No orders of the defined types were placed in this encounter.    Procedures: No procedures performed  Clinical Data: No additional findings.  ROS:  All other systems negative, except as noted in the HPI. Review of Systems  Objective: Vital Signs: There were no vitals taken for this visit.  Specialty Comments:  No specialty comments available.  PMFS History: Patient Active Problem List   Diagnosis Date Noted  . Myelopathy (HCC) 06/28/2020  . Bilateral carpal tunnel syndrome 03/29/2020   Past Medical History:  Diagnosis Date  . Bilateral carpal tunnel syndrome 03/29/2020    History reviewed. No pertinent family history.  Past Surgical History:  Procedure Laterality Date  . ABCESS DRAINAGE     in the jaw area  . ANTERIOR CERVICAL DECOMP/DISCECTOMY FUSION N/A 06/28/2020   Procedure: Anterior Cervical Decompression Fusion - Cervical three-four;  Surgeon: Donalee Citrin,  MD;  Location: [facility] OR;  Service: Neurosurgery;  Laterality: N/A;   Social History   Occupational History  . Not on file  Tobacco Use  . Smoking status: Current Every Day Smoker    Packs/day: 0.25    Years: 20.00    Pack years: 5.00    Types: Cigarettes  . Smokeless tobacco: Never Used  Vaping Use  . Vaping Use: Never used  Substance and Sexual Activity  . Alcohol use: Yes    Alcohol/week: 1.0 - 2.0 standard drink    Types: 1 - 2 Cans of beer per week  . Drug use: Never  . Sexual activity: Not on file

## 2020-09-19 DIAGNOSIS — R030 Elevated blood-pressure reading, without diagnosis of hypertension: Secondary | ICD-10-CM | POA: Insufficient documentation

## 2020-11-20 ENCOUNTER — Ambulatory Visit (AMBULATORY_SURGERY_CENTER): Payer: Self-pay | Admitting: *Deleted

## 2020-11-20 ENCOUNTER — Other Ambulatory Visit: Payer: Self-pay

## 2020-11-20 VITALS — Ht 68.0 in | Wt 150.0 lb

## 2020-11-20 DIAGNOSIS — Z1211 Encounter for screening for malignant neoplasm of colon: Secondary | ICD-10-CM

## 2020-11-20 MED ORDER — NA SULFATE-K SULFATE-MG SULF 17.5-3.13-1.6 GM/177ML PO SOLN: ORAL | 0 refills | Status: DC

## 2020-11-20 NOTE — Progress Notes (Signed)
Patient  And wife is here in-person for PV. Patient denies any allergies to eggs or soy. Patient denies any problems with anesthesia/sedation. Patient denies any oxygen use at home. Patient denies taking any diet/weight loss medications or blood thinners. Patient is not being treated for MRSA or C-diff. Patient is aware of our care-partner policy and Covid-19 safety protocol. EMMI education assigned to the patient for the procedure, sent to MyChart.   COVID-19 vaccines completed on 02/15/20 x2, per patient.

## 2020-12-04 ENCOUNTER — Encounter: Payer: Medicaid Other | Admitting: Gastroenterology

## 2021-02-13 ENCOUNTER — Encounter: Payer: Medicaid Other | Admitting: Gastroenterology

## 2021-02-20 ENCOUNTER — Other Ambulatory Visit: Payer: Self-pay | Admitting: Student

## 2021-02-20 DIAGNOSIS — M542 Cervicalgia: Secondary | ICD-10-CM | POA: Insufficient documentation

## 2021-02-20 DIAGNOSIS — R202 Paresthesia of skin: Secondary | ICD-10-CM | POA: Insufficient documentation

## 2021-02-20 DIAGNOSIS — R200 Anesthesia of skin: Secondary | ICD-10-CM | POA: Insufficient documentation

## 2021-02-21 ENCOUNTER — Other Ambulatory Visit: Payer: Self-pay | Admitting: Student

## 2021-02-21 DIAGNOSIS — M542 Cervicalgia: Secondary | ICD-10-CM

## 2021-03-05 ENCOUNTER — Ambulatory Visit
Admission: RE | Admit: 2021-03-05 | Discharge: 2021-03-05 | Disposition: A | Discharge status: Home / Self Care | Payer: Medicaid Other | Source: Ambulatory Visit | Attending: Student | Admitting: Student

## 2021-03-05 DIAGNOSIS — M542 Cervicalgia: Secondary | ICD-10-CM

## 2021-05-01 DIAGNOSIS — M25519 Pain in unspecified shoulder: Secondary | ICD-10-CM | POA: Insufficient documentation

## 2021-10-10 ENCOUNTER — Other Ambulatory Visit: Payer: Self-pay | Admitting: Neurosurgery

## 2021-10-23 ENCOUNTER — Ambulatory Visit (INDEPENDENT_AMBULATORY_CARE_PROVIDER_SITE_OTHER): Payer: Medicaid Other | Admitting: Family Medicine

## 2021-10-23 ENCOUNTER — Other Ambulatory Visit: Payer: Self-pay

## 2021-10-23 ENCOUNTER — Encounter: Payer: Self-pay | Admitting: Family Medicine

## 2021-10-23 VITALS — BP 142/94 | HR 76 | Temp 97.3°F | Ht 68.0 in | Wt 142.8 lb

## 2021-10-23 DIAGNOSIS — R351 Nocturia: Secondary | ICD-10-CM | POA: Diagnosis not present

## 2021-10-23 DIAGNOSIS — G959 Disease of spinal cord, unspecified: Secondary | ICD-10-CM | POA: Diagnosis not present

## 2021-10-23 NOTE — Progress Notes (Signed)
Provider:  Jacalyn Lefevre, MD  Careteam: Patient Care Team: Grayce Sessions, NP as PCP - General (Internal Medicine)  PLACE OF SERVICE:  [facility]  Advanced Directive information    No Known Allergies  Chief Complaint  Patient presents with   New Patient (Initial Visit)    Patient presents today for a new patient appointment.      HPI: Patient is a 61 y.o. male.  First [employer] care visit for this 61 year old African-American male.  He is generally healthy but has cervical disc disease and is scheduled for a repeat surgery on his neck in 2 weeks.  Also had history of carpal tunnel surgery but that did not seem to help his upper extremity symptoms.  The neck does affect his balance requiring him to walk with a cane.  He was scheduled for colonoscopy last February but that got postponed and he is aware that he needs to follow-up with that exam.  Review of his labs over the past several years shows no abnormalities including glucose renal function liver function inflammatory markers.  I do not see that he has had a PSA.  There is no family history of prostate cancer.  He denies any specific complaints today.  Medications include gabapentin and tramadol.  He does take some hydrocodone per neurosurgery for his neck.  Review of Systems:  Review of Systems  Constitutional: Negative.   HENT: Negative.    Eyes: Negative.   Respiratory: Negative.    Cardiovascular: Negative.   Gastrointestinal:  Negative for heartburn.  Genitourinary: Negative.   Musculoskeletal:  Positive for neck pain.  Skin: Negative.   Neurological:  Positive for weakness.  All other systems reviewed and are negative.  Past Medical History:  Diagnosis Date   Arthritis    hands   Bilateral carpal tunnel syndrome 03/29/2020   Past Surgical History:  Procedure Laterality Date   ABCESS DRAINAGE     in the jaw area   ANTERIOR CERVICAL DECOMP/DISCECTOMY FUSION N/A 06/28/2020   Procedure:  Anterior Cervical Decompression Fusion - Cervical three-four;  Surgeon: Donalee Citrin, MD;  Location: [facility] OR;  Service: Neurosurgery;  Laterality: N/A;   SPINE SURGERY  2021   Social History:   reports that he has been smoking cigarettes. He has a 12.50 pack-year smoking history. He has never used smokeless tobacco. He reports current alcohol use of about 4.0 standard drinks per week. He reports that he does not use drugs.  Family History  Problem Relation Age of Onset   Colon cancer Neg Hx    Colon polyps Neg Hx    Esophageal cancer Neg Hx    Rectal cancer Neg Hx    Stomach cancer Neg Hx     Medications: Patient's Medications  New Prescriptions   No medications on file  Previous Medications   CHOLECALCIFEROL (VITAMIN D3) 25 MCG (1000 UT) TABLET    Take 1,000 Units by mouth daily.   CYANOCOBALAMIN (VITAMIN B-12 PO)    Take by mouth.   HYDROCODONE-ACETAMINOPHEN (NORCO/VICODIN) 5-325 MG TABLET    Take 2 tablets by mouth every 4 (four) hours as needed for severe pain ((score 7 to 10)).   METHOCARBAMOL (ROBAXIN) 500 MG TABLET    Take 500 mg by mouth as needed.   MULTIPLE VITAMIN (MULTIVITAMIN) CAPSULE    Take 1 capsule by mouth daily.   NA SULFATE-K SULFATE-MG SULF 17.5-3.13-1.6 GM/177ML SOLN    Suprep (no substitutions)-TAKE AS DIRECTED.   PREGABALIN (LYRICA) 25 MG  CAPSULE    take 2 capsules by mouth every 12 hours daily   TRAMADOL (ULTRAM) 50 MG TABLET    Take by mouth.   TURMERIC (QC TUMERIC COMPLEX PO)    Take by mouth.   VITAMIN D PO    Take by mouth.   VITAMIN E 180 MG (400 UNITS) CAPSULE    Take 400 Units by mouth daily.  Modified Medications   No medications on file  Discontinued Medications   No medications on file    Physical Exam:  Vitals:   10/23/21 1012  BP: (!) 142/94  Pulse: 76  Temp: (!) 97.3 F (36.3 C)  SpO2: 98%  Weight: 142 lb 12.8 oz (64.8 kg)  Height: 5\' 8"  (1.727 m)   Body mass index is 21.71 kg/m. Wt Readings from Last 3 Encounters:  10/23/21 142 lb  12.8 oz (64.8 kg)  11/20/20 150 lb (68 kg)  08/21/20 140 lb 12.8 oz (63.9 kg)    Physical Exam Vitals and nursing note reviewed.  Constitutional:      Appearance: Normal appearance.  HENT:     Right Ear: Tympanic membrane normal.     Left Ear: Tympanic membrane normal.  Eyes:     Extraocular Movements: Extraocular movements intact.     Pupils: Pupils are equal, round, and reactive to light.  Cardiovascular:     Rate and Rhythm: Normal rate and regular rhythm.     Pulses: Normal pulses.  Pulmonary:     Effort: Pulmonary effort is normal.     Breath sounds: Normal breath sounds.  Abdominal:     General: Abdomen is flat. Bowel sounds are normal.     Palpations: Abdomen is soft.  Musculoskeletal:        General: Normal range of motion.     Cervical back: Tenderness present.  Neurological:     General: No focal deficit present.     Mental Status: He is alert and oriented to person, place, and time.    Labs reviewed: Basic Metabolic Panel: No results for input(s): NA, K, CL, CO2, GLUCOSE, BUN, CREATININE, CALCIUM, MG, PHOS, TSH in the last 8760 hours. Liver Function Tests: No results for input(s): AST, ALT, ALKPHOS, BILITOT, PROT, ALBUMIN in the last 8760 hours. No results for input(s): LIPASE, AMYLASE in the last 8760 hours. No results for input(s): AMMONIA in the last 8760 hours. CBC: No results for input(s): WBC, NEUTROABS, HGB, HCT, MCV, PLT in the last 8760 hours. Lipid Panel: No results for input(s): CHOL, HDL, LDLCALC, TRIG, CHOLHDL, LDLDIRECT in the last 8760 hours. TSH: No results for input(s): TSH in the last 8760 hours. A1C: Lab Results  Component Value Date   HGBA1C 5.3 03/07/2020     Assessment/Plan  1. Nocturia Although not necessarily a symptom of prostate cancer, suggest prostate is enlarged.  Has never been screened for prostate cancer  2. Myelopathy (HCC) Impending surgery for fusion of cervical vertebrae   03/09/2020, MD [facility] [phone]

## 2021-10-24 LAB — PSA, TOTAL AND FREE
PSA, % Free: 40 % (calc) (ref >25)
PSA, Free: 0.2 ng/mL
PSA, Total: 0.5 ng/mL (ref <=4.0)

## 2021-10-26 NOTE — Progress Notes (Signed)
Surgical Instructions    Your procedure is scheduled on 11/05/21.  Report to [facility] Main Entrance "A" at 9:30 A.M., then check in with the Admitting office.  Call this number if you have problems the morning of surgery:  [phone]   If you have any questions prior to your surgery date call [phone]: Open Monday-Friday 8am-4pm    Remember:  Do not eat or drink after midnight the night before your surgery     Take these medicines the morning of surgery with A SIP OF WATER:  AS NEEDED:  acetaminophen (TYLENOL) traMADol (ULTRAM)   As of today, STOP taking any Aspirin (unless otherwise instructed by your surgeon) Aleve, Naproxen, Ibuprofen, Motrin, Advil, Goody's, BC's, all herbal medications, fish oil, and all vitamins.     After your COVID test   You are not required to quarantine however you are required to wear a well-fitting mask when you are out and around people not in your household.  If your mask becomes wet or soiled, replace with a new one.  Wash your hands often with soap and water for 20 seconds or clean your hands with an alcohol-based hand sanitizer that contains at least 60% alcohol.  Do not share personal items.  Notify your provider: if you are in close contact with someone who has COVID  or if you develop a fever of 100.4 or greater, sneezing, cough, sore throat, shortness of breath or body aches.           Do not wear jewelry  Do not wear lotions, powders, colognes, or deodorant. Do not shave 48 hours prior to surgery.  Men may shave face and neck. Do not bring valuables to the hospital.              [facility] is not responsible for any belongings or valuables.  Do NOT Smoke (Tobacco/Vaping)  24 hours prior to your procedure  If you use a CPAP at night, you may bring your mask for your overnight stay.   Contacts, glasses, hearing aids, dentures or partials may not be worn into surgery, please bring cases for these belongings   For  patients admitted to the hospital, discharge time will be determined by your treatment team.   Patients discharged the day of surgery will not be allowed to drive home, and someone needs to stay with them for 24 hours.  NO VISITORS WILL BE ALLOWED IN PRE-OP WHERE PATIENTS ARE PREPPED FOR SURGERY.  ONLY 1 SUPPORT PERSON MAY BE PRESENT IN THE WAITING ROOM WHILE YOU ARE IN SURGERY.  IF YOU ARE TO BE ADMITTED, ONCE YOU ARE IN YOUR ROOM YOU WILL BE ALLOWED TWO (2) VISITORS. 1 (ONE) VISITOR MAY STAY OVERNIGHT BUT MUST ARRIVE TO THE ROOM BY 8pm.  Minor children may have two parents present. Special consideration for safety and communication needs will be reviewed on a case by case basis.  Special instructions:    Oral Hygiene is also important to reduce your risk of infection.  Remember - BRUSH YOUR TEETH THE MORNING OF SURGERY WITH YOUR REGULAR TOOTHPASTE   [facility]- Preparing For Surgery  Before surgery, you can play an important role. Because skin is not sterile, your skin needs to be as free of germs as possible. You can reduce the number of germs on your skin by washing with CHG (chlorahexidine gluconate) Soap before surgery.  CHG is an antiseptic cleaner which kills germs and bonds with the skin to continue killing germs even  after washing.     Please do not use if you have an allergy to CHG or antibacterial soaps. If your skin becomes reddened/irritated stop using the CHG.  Do not shave (including legs and underarms) for at least 48 hours prior to first CHG shower. It is OK to shave your face.  Please follow these instructions carefully.     Shower the NIGHT BEFORE SURGERY and the MORNING OF SURGERY with CHG Soap.   If you chose to wash your hair, wash your hair first as usual with your normal shampoo. After you shampoo, rinse your hair and body thoroughly to remove the shampoo.  Then [employer] and genitals (private parts) with your normal soap and rinse thoroughly to remove soap.  After  that Use CHG Soap as you would any other liquid soap. You can apply CHG directly to the skin and wash gently with a scrungie or a clean washcloth.   Apply the CHG Soap to your body ONLY FROM THE NECK DOWN.  Do not use on open wounds or open sores. Avoid contact with your eyes, ears, mouth and genitals (private parts). Wash Face and genitals (private parts)  with your normal soap.   Wash thoroughly, paying special attention to the area where your surgery will be performed.  Thoroughly rinse your body with warm water from the neck down.  DO NOT shower/wash with your normal soap after using and rinsing off the CHG Soap.  Pat yourself dry with a CLEAN TOWEL.  Wear CLEAN PAJAMAS to bed the night before surgery  Place CLEAN SHEETS on your bed the night before your surgery  DO NOT SLEEP WITH PETS.   Day of Surgery:  Take a shower with CHG soap. Wear Clean/Comfortable clothing the morning of surgery Do not apply any deodorants/lotions.   Remember to brush your teeth WITH YOUR REGULAR TOOTHPASTE.   Please read over the following fact sheets that you were given.

## 2021-10-29 ENCOUNTER — Encounter (HOSPITAL_COMMUNITY): Payer: Self-pay

## 2021-10-29 ENCOUNTER — Other Ambulatory Visit: Payer: Self-pay

## 2021-10-29 ENCOUNTER — Encounter (HOSPITAL_COMMUNITY)
Admission: RE | Admit: 2021-10-29 | Discharge: 2021-10-29 | Disposition: A | Discharge status: Home / Self Care | Payer: Medicaid Other | Source: Ambulatory Visit | Attending: Neurosurgery | Admitting: Neurosurgery

## 2021-10-29 VITALS — BP 128/88 | HR 66 | Temp 97.9°F | Resp 18 | Ht 68.0 in

## 2021-10-29 DIAGNOSIS — Z01812 Encounter for preprocedural laboratory examination: Secondary | ICD-10-CM | POA: Diagnosis not present

## 2021-10-29 DIAGNOSIS — Z01818 Encounter for other preprocedural examination: Secondary | ICD-10-CM

## 2021-10-29 LAB — CBC
HCT: 41.9 % (ref 39.0–52.0)
Hemoglobin: 14.2 g/dL (ref 13.0–17.0)
MCH: 33.7 pg (ref 26.0–34.0)
MCHC: 33.9 g/dL (ref 30.0–36.0)
MCV: 99.5 fL (ref 80.0–100.0)
Platelets: 260 10*3/uL (ref 150–400)
RBC: 4.21 MIL/uL — ABNORMAL LOW (ref 4.22–5.81)
RDW: 12.8 % (ref 11.5–15.5)
WBC: 7.4 10*3/uL (ref 4.0–10.5)
nRBC: 0 % (ref 0.0–0.2)

## 2021-10-29 LAB — TYPE AND SCREEN
ABO/RH(D): O POS
Antibody Screen: NEGATIVE

## 2021-10-29 LAB — SURGICAL PCR SCREEN
MRSA, PCR: NEGATIVE
Staphylococcus aureus: NEGATIVE

## 2021-10-29 NOTE — Progress Notes (Signed)
PCP - Dr. Jacalyn Lefevre Cardiologist - denies  PPM/ICD - denies   Chest x-ray - denies EKG - 10/19/19 Stress Test - denies ECHO - denies Cardiac Cath - denies  Sleep Study - denies  DM- denies  Blood Thinner Instructions: n/a Aspirin Instructions: n/a  ERAS Protcol - no, NPO   COVID TEST- pt scheduled for testing on 11/01/21   Anesthesia review: no  Patient denies shortness of breath, fever, cough and chest pain at PAT appointment   All instructions explained to the patient, with a verbal understanding of the material. Patient agrees to go over the instructions while at home for a better understanding. Patient also instructed to wear a mask in public after being tested for COVID-19. The opportunity to ask questions was provided.

## 2021-11-01 ENCOUNTER — Other Ambulatory Visit (HOSPITAL_COMMUNITY)
Admission: RE | Admit: 2021-11-01 | Discharge: 2021-11-01 | Disposition: A | Discharge status: Home / Self Care | Payer: Medicaid Other | Source: Ambulatory Visit | Attending: Neurosurgery | Admitting: Neurosurgery

## 2021-11-01 DIAGNOSIS — Z01818 Encounter for other preprocedural examination: Secondary | ICD-10-CM

## 2021-11-01 DIAGNOSIS — Z01812 Encounter for preprocedural laboratory examination: Secondary | ICD-10-CM | POA: Insufficient documentation

## 2021-11-01 DIAGNOSIS — Z20822 Contact with and (suspected) exposure to covid-19: Secondary | ICD-10-CM | POA: Insufficient documentation

## 2021-11-01 LAB — SARS CORONAVIRUS 2 (TAT 6-24 HRS): SARS Coronavirus 2: NEGATIVE

## 2021-11-04 NOTE — Anesthesia Preprocedure Evaluation (Addendum)
Anesthesia Evaluation  Patient identified by MRN, date of birth, ID band Patient awake    Reviewed: Allergy & Precautions, NPO status , Patient's Chart, lab work & pertinent test results, Unable to perform ROS - Chart review only  Airway Mallampati: II  TM Distance: >3 FB Neck ROM: Full    Dental  (+) Dental Advisory Given, Edentulous Upper, Edentulous Lower   Pulmonary Current Smoker,    Pulmonary exam normal        Cardiovascular negative cardio ROS Normal cardiovascular exam     Neuro/Psych negative neurological ROS  negative psych ROS   GI/Hepatic negative GI ROS, Neg liver ROS,   Endo/Other  negative endocrine ROS  Renal/GU negative Renal ROS  negative genitourinary   Musculoskeletal negative musculoskeletal ROS (+)   Abdominal   Peds negative pediatric ROS (+)  Hematology negative hematology ROS (+)   Anesthesia Other Findings   Reproductive/Obstetrics negative OB ROS                            Anesthesia Physical  Anesthesia Plan  ASA: 2  Anesthesia Plan: General   Post-op Pain Management:    Induction: Intravenous  PONV Risk Score and Plan: 3 and Ondansetron, Treatment may vary due to age or medical condition, Dexamethasone and Midazolam  Airway Management Planned: Oral ETT  Additional Equipment: None  Intra-op Plan:   Post-operative Plan: Extubation in OR  Informed Consent: I have reviewed the patients History and Physical, chart, labs and discussed the procedure including the risks, benefits and alternatives for the proposed anesthesia with the patient or authorized representative who has indicated his/her understanding and acceptance.     Dental advisory given  Plan Discussed with: Anesthesiologist and CRNA  Anesthesia Plan Comments:        Anesthesia Quick Evaluation

## 2021-11-05 ENCOUNTER — Inpatient Hospital Stay (HOSPITAL_COMMUNITY): Payer: Medicaid Other | Admitting: Anesthesiology

## 2021-11-05 ENCOUNTER — Inpatient Hospital Stay (HOSPITAL_COMMUNITY)
Admission: RE | Disposition: A | Discharge status: Home / Self Care | Payer: Self-pay | Source: Home / Self Care | Attending: Neurosurgery

## 2021-11-05 ENCOUNTER — Other Ambulatory Visit: Payer: Self-pay

## 2021-11-05 ENCOUNTER — Encounter (HOSPITAL_COMMUNITY): Payer: Self-pay | Admitting: Neurosurgery

## 2021-11-05 ENCOUNTER — Inpatient Hospital Stay (HOSPITAL_COMMUNITY): Payer: Medicaid Other

## 2021-11-05 ENCOUNTER — Inpatient Hospital Stay (HOSPITAL_COMMUNITY)
Admission: RE | Admit: 2021-11-05 | Discharge: 2021-11-06 | DRG: 473 | Disposition: A | Discharge status: Home / Self Care | Payer: Medicaid Other | Attending: Neurosurgery | Admitting: Neurosurgery

## 2021-11-05 DIAGNOSIS — M2578 Osteophyte, vertebrae: Secondary | ICD-10-CM | POA: Diagnosis present

## 2021-11-05 DIAGNOSIS — M4802 Spinal stenosis, cervical region: Secondary | ICD-10-CM | POA: Diagnosis present

## 2021-11-05 DIAGNOSIS — Z981 Arthrodesis status: Secondary | ICD-10-CM

## 2021-11-05 DIAGNOSIS — M19041 Primary osteoarthritis, right hand: Secondary | ICD-10-CM | POA: Diagnosis present

## 2021-11-05 DIAGNOSIS — F17210 Nicotine dependence, cigarettes, uncomplicated: Secondary | ICD-10-CM | POA: Diagnosis present

## 2021-11-05 DIAGNOSIS — M4722 Other spondylosis with radiculopathy, cervical region: Principal | ICD-10-CM | POA: Diagnosis present

## 2021-11-05 DIAGNOSIS — Z419 Encounter for procedure for purposes other than remedying health state, unspecified: Secondary | ICD-10-CM

## 2021-11-05 DIAGNOSIS — M19042 Primary osteoarthritis, left hand: Secondary | ICD-10-CM | POA: Diagnosis present

## 2021-11-05 HISTORY — PX: ANTERIOR CERVICAL DECOMP/DISCECTOMY FUSION: SHX1161

## 2021-11-05 LAB — ABO/RH: ABO/RH(D): O POS

## 2021-11-05 IMAGING — RF DG CERVICAL SPINE 2 OR 3 VIEWS
1 series · 2 of 2 positions shown · non-contrast
Comparison: CT cervical spine [DATE]

CLINICAL DATA: Anterior cervical discectomy infusion.

EXAM:
CERVICAL SPINE - 2-3 VIEW

[Series 1: run · 2 of 2 slices shown]
[im 1/2]
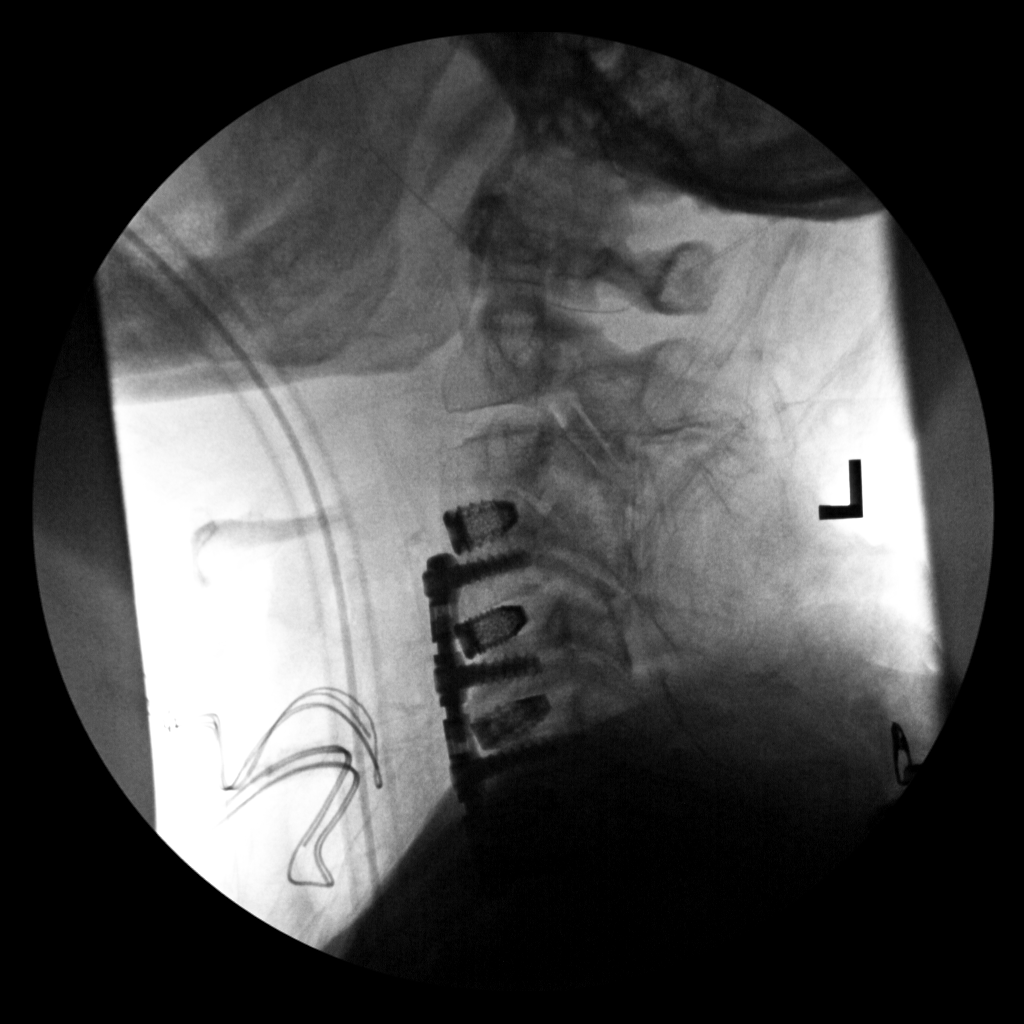
[im 2/2]
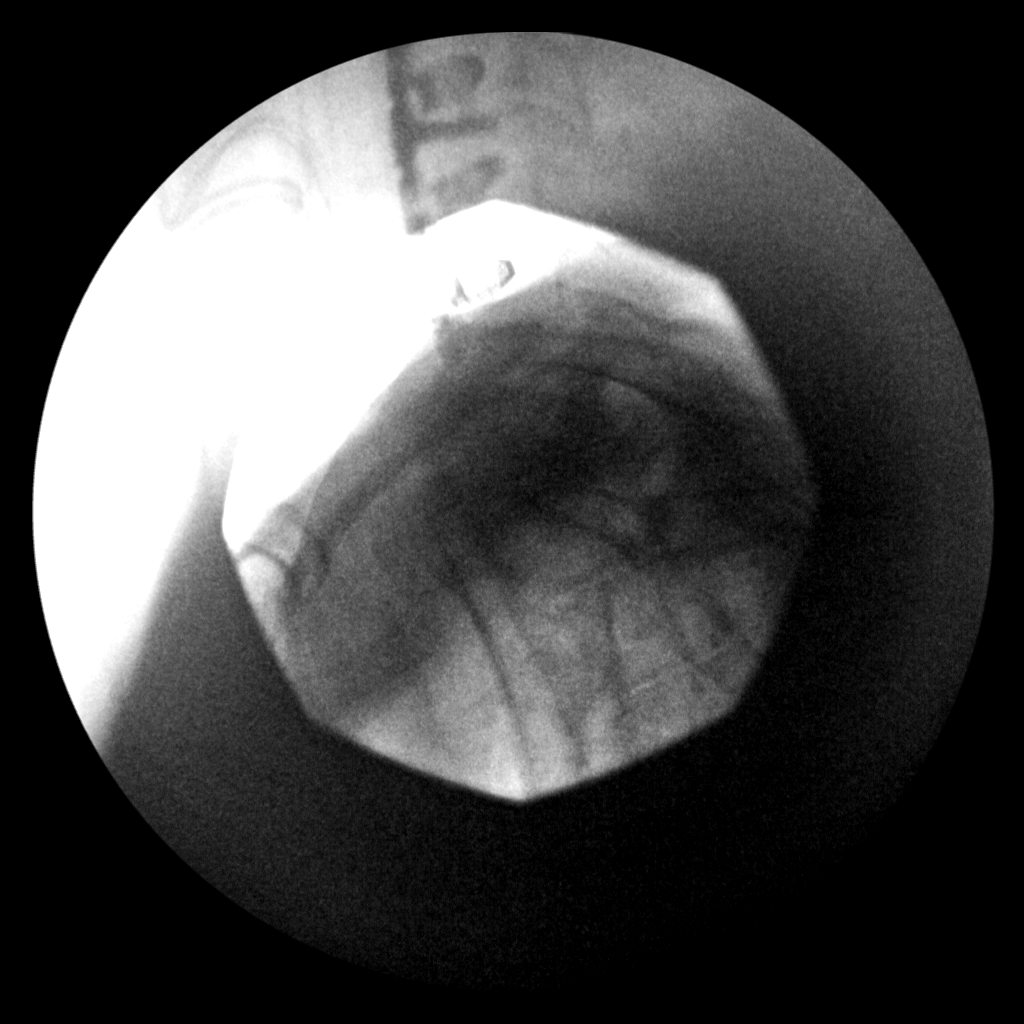

[2 of 2 positions shown; findings below may reference images not displayed]

FINDINGS: 2 intraoperative spot fluoro views are submitted.

Film labeled "3" shows interval discectomy at C5-6 and C6-7 with
lower cervical levels obscured by the patient's shoulders. Anterior
plate seen previously is C3-4 has been removed. Film labeled "for"
is centered over the lower cervical spine although bony anatomy not
well demonstrated.
IMPRESSION: Intraoperative evaluation during anterior cervical discectomy and
fusion with anterior plate.

## 2021-11-05 SURGERY — ANTERIOR CERVICAL DECOMPRESSION/DISCECTOMY FUSION 3 LEVELS
Anesthesia: General

## 2021-11-05 MED ORDER — SODIUM CHLORIDE 0.9% FLUSH: 3.0000 mL | Freq: Two times a day (BID) | INTRAVENOUS | Status: DC

## 2021-11-05 MED ORDER — OXYCODONE HCL 5 MG PO TABS
10.0000 mg | ORAL_TABLET | ORAL | Status: DC | PRN
  Administered 2021-11-05 – 2021-11-06 (×2): 10 mg via ORAL
  Filled 2021-11-05 (×2): qty 2

## 2021-11-05 MED ORDER — TRAMADOL HCL 50 MG PO TABS
50.0000 mg | ORAL_TABLET | Freq: Four times a day (QID) | ORAL | Status: DC | PRN
Start: 2021-11-05 — End: 2021-11-06
  Administered 2021-11-05: 50 mg via ORAL
  Filled 2021-11-05: qty 1

## 2021-11-05 MED ORDER — PHENOL 1.4 % MT LIQD: 1.0000 | OROMUCOSAL | Status: DC | PRN

## 2021-11-05 MED ORDER — VITAMIN D 25 MCG (1000 UNIT) PO TABS: 1000.0000 [IU] | ORAL_TABLET | Freq: Every day | ORAL | Status: DC

## 2021-11-05 MED ORDER — ACETAMINOPHEN 500 MG PO TABS
1000.0000 mg | ORAL_TABLET | Freq: Once | ORAL | Status: AC
  Administered 2021-11-05: 1000 mg via ORAL
  Filled 2021-11-05: qty 2

## 2021-11-05 MED ORDER — 0.9 % SODIUM CHLORIDE (POUR BTL) OPTIME
TOPICAL | Status: DC | PRN
  Administered 2021-11-05 (×2): 1000 mL

## 2021-11-05 MED ORDER — TURMERIC 500 MG PO CAPS: ORAL_CAPSULE | Freq: Every day | ORAL | Status: DC

## 2021-11-05 MED ORDER — FENTANYL CITRATE (PF) 100 MCG/2ML IJ SOLN
25.0000 ug | INTRAMUSCULAR | Status: DC | PRN
  Administered 2021-11-05 (×2): 25 ug via INTRAVENOUS

## 2021-11-05 MED ORDER — CHLORHEXIDINE GLUCONATE CLOTH 2 % EX PADS: 6.0000 | MEDICATED_PAD | Freq: Once | CUTANEOUS | Status: DC

## 2021-11-05 MED ORDER — MIDAZOLAM HCL 2 MG/2ML IJ SOLN
INTRAMUSCULAR | Status: AC
  Filled 2021-11-05: qty 2

## 2021-11-05 MED ORDER — ACETAMINOPHEN 325 MG PO TABS: 650.0000 mg | ORAL_TABLET | ORAL | Status: DC | PRN

## 2021-11-05 MED ORDER — LIDOCAINE 2% (20 MG/ML) 5 ML SYRINGE
INTRAMUSCULAR | Status: AC
  Filled 2021-11-05: qty 5

## 2021-11-05 MED ORDER — HYDROMORPHONE HCL 1 MG/ML IJ SOLN
0.5000 mg | INTRAMUSCULAR | Status: DC | PRN
  Administered 2021-11-05: 0.5 mg via INTRAVENOUS
  Filled 2021-11-05: qty 0.5

## 2021-11-05 MED ORDER — PHENYLEPHRINE 40 MCG/ML (10ML) SYRINGE FOR IV PUSH (FOR BLOOD PRESSURE SUPPORT)
PREFILLED_SYRINGE | INTRAVENOUS | Status: AC
  Filled 2021-11-05: qty 10

## 2021-11-05 MED ORDER — ADULT MULTIVITAMIN W/MINERALS CH: 1.0000 | ORAL_TABLET | Freq: Every day | ORAL | Status: DC

## 2021-11-05 MED ORDER — THROMBIN 20000 UNITS EX SOLR
CUTANEOUS | Status: DC | PRN
  Administered 2021-11-05: 20 mL via TOPICAL

## 2021-11-05 MED ORDER — SODIUM CHLORIDE 0.9% FLUSH: 3.0000 mL | INTRAVENOUS | Status: DC | PRN

## 2021-11-05 MED ORDER — ONDANSETRON HCL 4 MG/2ML IJ SOLN: 4.0000 mg | Freq: Four times a day (QID) | INTRAMUSCULAR | Status: DC | PRN

## 2021-11-05 MED ORDER — ONDANSETRON HCL 4 MG PO TABS: 4.0000 mg | ORAL_TABLET | Freq: Four times a day (QID) | ORAL | Status: DC | PRN

## 2021-11-05 MED ORDER — THROMBIN 20000 UNITS EX SOLR
CUTANEOUS | Status: AC
  Filled 2021-11-05: qty 20000

## 2021-11-05 MED ORDER — LACTATED RINGERS IV SOLN: INTRAVENOUS | Status: DC

## 2021-11-05 MED ORDER — ROCURONIUM BROMIDE 10 MG/ML (PF) SYRINGE
PREFILLED_SYRINGE | INTRAVENOUS | Status: DC | PRN
  Administered 2021-11-05: 80 mg via INTRAVENOUS
  Administered 2021-11-05 (×2): 30 mg via INTRAVENOUS

## 2021-11-05 MED ORDER — SUGAMMADEX SODIUM 200 MG/2ML IV SOLN
INTRAVENOUS | Status: DC | PRN
  Administered 2021-11-05: 200 mg via INTRAVENOUS

## 2021-11-05 MED ORDER — CYCLOBENZAPRINE HCL 10 MG PO TABS
10.0000 mg | ORAL_TABLET | Freq: Three times a day (TID) | ORAL | Status: DC | PRN
  Administered 2021-11-05 – 2021-11-06 (×2): 10 mg via ORAL
  Filled 2021-11-05 (×2): qty 1

## 2021-11-05 MED ORDER — PANTOPRAZOLE SODIUM 40 MG IV SOLR
40.0000 mg | Freq: Every day | INTRAVENOUS | Status: DC
  Administered 2021-11-05: 40 mg via INTRAVENOUS
  Filled 2021-11-05: qty 40

## 2021-11-05 MED ORDER — PROMETHAZINE HCL 25 MG/ML IJ SOLN: 6.2500 mg | INTRAMUSCULAR | Status: DC | PRN

## 2021-11-05 MED ORDER — ORAL CARE MOUTH RINSE: 15.0000 mL | Freq: Once | OROMUCOSAL | Status: AC

## 2021-11-05 MED ORDER — CEFAZOLIN SODIUM-DEXTROSE 2-4 GM/100ML-% IV SOLN
2.0000 g | Freq: Three times a day (TID) | INTRAVENOUS | Status: AC
  Administered 2021-11-05 – 2021-11-06 (×2): 2 g via INTRAVENOUS
  Filled 2021-11-05 (×2): qty 100

## 2021-11-05 MED ORDER — VITAMIN E 45 MG (100 UNIT) PO CAPS
400.0000 [IU] | ORAL_CAPSULE | Freq: Every day | ORAL | Status: DC
  Filled 2021-11-05: qty 4

## 2021-11-05 MED ORDER — DEXAMETHASONE SODIUM PHOSPHATE 10 MG/ML IJ SOLN
INTRAMUSCULAR | Status: DC | PRN
Start: 2021-11-05 — End: 2021-11-05
  Administered 2021-11-05: 10 mg via INTRAVENOUS

## 2021-11-05 MED ORDER — ACETAMINOPHEN 500 MG PO TABS: 1000.0000 mg | ORAL_TABLET | Freq: Four times a day (QID) | ORAL | Status: DC | PRN

## 2021-11-05 MED ORDER — HYDROCODONE-ACETAMINOPHEN 5-325 MG PO TABS: 2.0000 | ORAL_TABLET | ORAL | Status: DC | PRN

## 2021-11-05 MED ORDER — PROPOFOL 10 MG/ML IV BOLUS
INTRAVENOUS | Status: DC | PRN
  Administered 2021-11-05: 130 mg via INTRAVENOUS

## 2021-11-05 MED ORDER — CEFAZOLIN SODIUM-DEXTROSE 2-4 GM/100ML-% IV SOLN
2.0000 g | INTRAVENOUS | Status: AC
  Administered 2021-11-05: 2 g via INTRAVENOUS
  Filled 2021-11-05: qty 100

## 2021-11-05 MED ORDER — MENTHOL 3 MG MT LOZG: 1.0000 | LOZENGE | OROMUCOSAL | Status: DC | PRN

## 2021-11-05 MED ORDER — LIDOCAINE 2% (20 MG/ML) 5 ML SYRINGE
INTRAMUSCULAR | Status: DC | PRN
Start: 2021-11-05 — End: 2021-11-05
  Administered 2021-11-05: 100 mg via INTRAVENOUS

## 2021-11-05 MED ORDER — HYDROCODONE-ACETAMINOPHEN 5-325 MG PO TABS
2.0000 | ORAL_TABLET | ORAL | Status: DC | PRN
  Administered 2021-11-05 – 2021-11-06 (×3): 2 via ORAL
  Filled 2021-11-05 (×4): qty 2

## 2021-11-05 MED ORDER — FENTANYL CITRATE (PF) 250 MCG/5ML IJ SOLN
INTRAMUSCULAR | Status: DC | PRN
  Administered 2021-11-05 (×5): 50 ug via INTRAVENOUS

## 2021-11-05 MED ORDER — CHLORHEXIDINE GLUCONATE 0.12 % MT SOLN
15.0000 mL | Freq: Once | OROMUCOSAL | Status: AC
  Administered 2021-11-05: 15 mL via OROMUCOSAL
  Filled 2021-11-05: qty 15

## 2021-11-05 MED ORDER — ROCURONIUM BROMIDE 10 MG/ML (PF) SYRINGE
PREFILLED_SYRINGE | INTRAVENOUS | Status: AC
  Filled 2021-11-05: qty 10

## 2021-11-05 MED ORDER — THROMBIN 5000 UNITS EX SOLR
OROMUCOSAL | Status: DC | PRN
  Administered 2021-11-05 (×2): 5 mL via TOPICAL

## 2021-11-05 MED ORDER — THROMBIN 5000 UNITS EX SOLR
CUTANEOUS | Status: AC
  Filled 2021-11-05: qty 5000

## 2021-11-05 MED ORDER — HYDROMORPHONE HCL 1 MG/ML IJ SOLN
INTRAMUSCULAR | Status: AC
  Filled 2021-11-05: qty 0.5

## 2021-11-05 MED ORDER — HYDROMORPHONE HCL 1 MG/ML IJ SOLN
INTRAMUSCULAR | Status: DC | PRN
  Administered 2021-11-05 (×2): .5 mg via INTRAVENOUS

## 2021-11-05 MED ORDER — MIDAZOLAM HCL 2 MG/2ML IJ SOLN
INTRAMUSCULAR | Status: DC | PRN
Start: 2021-11-05 — End: 2021-11-05
  Administered 2021-11-05: 2 mg via INTRAVENOUS

## 2021-11-05 MED ORDER — PROPOFOL 10 MG/ML IV BOLUS
INTRAVENOUS | Status: AC
  Filled 2021-11-05: qty 20

## 2021-11-05 MED ORDER — SODIUM CHLORIDE 0.9 % IV SOLN: 250.0000 mL | INTRAVENOUS | Status: DC

## 2021-11-05 MED ORDER — PHENYLEPHRINE 40 MCG/ML (10ML) SYRINGE FOR IV PUSH (FOR BLOOD PRESSURE SUPPORT)
PREFILLED_SYRINGE | INTRAVENOUS | Status: DC | PRN
  Administered 2021-11-05: 80 ug via INTRAVENOUS

## 2021-11-05 MED ORDER — PREGABALIN 25 MG PO CAPS: 50.0000 mg | ORAL_CAPSULE | Freq: Every day | ORAL | Status: DC

## 2021-11-05 MED ORDER — AMISULPRIDE (ANTIEMETIC) 5 MG/2ML IV SOLN: 10.0000 mg | Freq: Once | INTRAVENOUS | Status: DC | PRN

## 2021-11-05 MED ORDER — ALUM & MAG HYDROXIDE-SIMETH 200-200-20 MG/5ML PO SUSP: 30.0000 mL | Freq: Four times a day (QID) | ORAL | Status: DC | PRN

## 2021-11-05 MED ORDER — FENTANYL CITRATE (PF) 100 MCG/2ML IJ SOLN
INTRAMUSCULAR | Status: AC
  Filled 2021-11-05: qty 2

## 2021-11-05 MED ORDER — ACETAMINOPHEN 650 MG RE SUPP: 650.0000 mg | RECTAL | Status: DC | PRN

## 2021-11-05 MED ORDER — FENTANYL CITRATE (PF) 250 MCG/5ML IJ SOLN
INTRAMUSCULAR | Status: AC
  Filled 2021-11-05: qty 5

## 2021-11-05 MED ORDER — ONDANSETRON HCL 4 MG/2ML IJ SOLN
INTRAMUSCULAR | Status: DC | PRN
  Administered 2021-11-05: 4 mg via INTRAVENOUS

## 2021-11-05 SURGICAL SUPPLY — 66 items
BAG COUNTER SPONGE SURGICOUNT (BAG) ×2 IMPLANT
BAND RUBBER #18 3X1/16 STRL (MISCELLANEOUS) ×4 IMPLANT
BASKET BONE COLLECTION (BASKET) ×2 IMPLANT
BENZOIN TINCTURE PRP APPL 2/3 (GAUZE/BANDAGES/DRESSINGS) ×2 IMPLANT
BIT DRILL NEURO 2X3.1 SFT TUCH (MISCELLANEOUS) ×1 IMPLANT
BONE VIVIGEN FORMABLE 1.3CC (Bone Implant) ×4 IMPLANT
BUR MATCHSTICK NEURO 3.0 LAGG (BURR) ×2 IMPLANT
CANISTER SUCT 3000ML PPV (MISCELLANEOUS) ×2 IMPLANT
CARTRIDGE OIL MAESTRO DRILL (MISCELLANEOUS) ×1 IMPLANT
DECANTER SPIKE VIAL GLASS SM (MISCELLANEOUS) ×2 IMPLANT
DERMABOND ADVANCED (GAUZE/BANDAGES/DRESSINGS) ×1
DERMABOND ADVANCED .7 DNX12 (GAUZE/BANDAGES/DRESSINGS) ×1 IMPLANT
DIFFUSER DRILL AIR PNEUMATIC (MISCELLANEOUS) ×2 IMPLANT
DRAIN JACKSON PRT FLT 7MM (DRAIN) ×1 IMPLANT
DRAPE C-ARM 42X72 X-RAY (DRAPES) ×4 IMPLANT
DRAPE LAPAROTOMY 100X72 PEDS (DRAPES) ×2 IMPLANT
DRAPE MICROSCOPE LEICA (MISCELLANEOUS) ×2 IMPLANT
DRILL NEURO 2X3.1 SOFT TOUCH (MISCELLANEOUS) ×2
DRSG OPSITE POSTOP 4X6 (GAUZE/BANDAGES/DRESSINGS) ×1 IMPLANT
DURAPREP 6ML APPLICATOR 50/CS (WOUND CARE) ×2 IMPLANT
ELECT COATED BLADE 2.86 ST (ELECTRODE) ×2 IMPLANT
ELECT REM PT RETURN 9FT ADLT (ELECTROSURGICAL) ×2
ELECTRODE REM PT RTRN 9FT ADLT (ELECTROSURGICAL) ×1 IMPLANT
EVACUATOR SILICONE 100CC (DRAIN) ×1 IMPLANT
GAUZE 4X4 16PLY ~~LOC~~+RFID DBL (SPONGE) ×1 IMPLANT
GAUZE SPONGE 4X4 12PLY STRL (GAUZE/BANDAGES/DRESSINGS) ×2 IMPLANT
GLOVE EXAM NITRILE XL STR (GLOVE) IMPLANT
GLOVE SURG ENC MOIS LTX SZ7 (GLOVE) ×1 IMPLANT
GLOVE SURG ENC MOIS LTX SZ8 (GLOVE) ×2 IMPLANT
GLOVE SURG POLYISO LF SZ7 (GLOVE) ×4 IMPLANT
GLOVE SURG UNDER POLY LF SZ7 (GLOVE) ×3 IMPLANT
GLOVE SURG UNDER POLY LF SZ7.5 (GLOVE) ×1 IMPLANT
GLOVE SURG UNDER POLY LF SZ8.5 (GLOVE) ×2 IMPLANT
GOWN STRL REUS W/ TWL LRG LVL3 (GOWN DISPOSABLE) IMPLANT
GOWN STRL REUS W/ TWL XL LVL3 (GOWN DISPOSABLE) ×1 IMPLANT
GOWN STRL REUS W/TWL 2XL LVL3 (GOWN DISPOSABLE) IMPLANT
GOWN STRL REUS W/TWL LRG LVL3 (GOWN DISPOSABLE) ×2
GOWN STRL REUS W/TWL XL LVL3 (GOWN DISPOSABLE) ×1
GRAFT BNE MATRIX VG FRMBL SM 1 (Bone Implant) IMPLANT
HALTER HD/CHIN CERV TRACTION D (MISCELLANEOUS) ×2 IMPLANT
HEMOSTAT POWDER KIT SURGIFOAM (HEMOSTASIS) ×3 IMPLANT
KIT BASIN OR (CUSTOM PROCEDURE TRAY) ×2 IMPLANT
KIT TURNOVER KIT B (KITS) ×2 IMPLANT
NDL SPNL 20GX3.5 QUINCKE YW (NEEDLE) ×1 IMPLANT
NEEDLE SPNL 20GX3.5 QUINCKE YW (NEEDLE) ×2 IMPLANT
NS IRRIG 1000ML POUR BTL (IV SOLUTION) ×2 IMPLANT
OIL CARTRIDGE MAESTRO DRILL (MISCELLANEOUS) ×2
PACK LAMINECTOMY NEURO (CUSTOM PROCEDURE TRAY) ×2 IMPLANT
PIN DISTRACTION 14MM (PIN) IMPLANT
PLATE 3 57.5XLCK NS SPNE CVD (Plate) IMPLANT
PLATE 3 ATLANTIS TRANS (Plate) ×1 IMPLANT
SCREW ST 15X4.5XST FXANG NS (Screw) IMPLANT
SCREW ST 15X4XST FXANG NS (Screw) IMPLANT
SCREW ST FIX 4 ATL (Screw) ×8 IMPLANT
SCREW ST FIX 4.5 ATL (Screw) ×1 IMPLANT
SPACER HEDRON C 12X14X5 0D (Spacer) ×1 IMPLANT
SPACER HEDRON C 12X14X6 0D (Spacer) ×2 IMPLANT
SPONGE INTESTINAL PEANUT (DISPOSABLE) ×3 IMPLANT
SPONGE SURGIFOAM ABS GEL 100 (HEMOSTASIS) ×2 IMPLANT
STRIP CLOSURE SKIN 1/2X4 (GAUZE/BANDAGES/DRESSINGS) ×2 IMPLANT
SUT VIC AB 3-0 SH 8-18 (SUTURE) ×3 IMPLANT
SUT VIC AB 4-0 PS2 27 (SUTURE) ×2 IMPLANT
TAPE CLOTH 4X10 WHT NS (GAUZE/BANDAGES/DRESSINGS) IMPLANT
TOWEL GREEN STERILE (TOWEL DISPOSABLE) ×2 IMPLANT
TOWEL GREEN STERILE FF (TOWEL DISPOSABLE) ×2 IMPLANT
WATER STERILE IRR 1000ML POUR (IV SOLUTION) ×2 IMPLANT

## 2021-11-05 NOTE — Transfer of Care (Signed)
Immediate Anesthesia Transfer of Care Note  Patient: Mark Hampton  Procedure(s) Performed: Anterior Cervical Discectomy and Fusion Cervical Four-Five/Cervical Five-Six/Cervical Six-Seven with Hardware Removal Cervical Three-Four  Patient Location: PACU  Anesthesia Type:General  Level of Consciousness: awake, alert  and oriented  Airway & Oxygen Therapy: Patient Spontanous Breathing  Post-op Assessment: Report given to RN, Post -op Vital signs reviewed and stable and Patient moving all extremities  Post vital signs: Reviewed and stable  Last Vitals:  Vitals Value Taken Time  BP    Temp    Pulse    Resp    SpO2      Last Pain:  Vitals:   11/05/21 0911  TempSrc:   PainSc: 8       Patients Stated Pain Goal: 3 (11/05/21 0911)  Complications: No notable events documented.

## 2021-11-05 NOTE — Op Note (Signed)
Preoperative diagnosis: Cervical spondylosis with radiculopathy C4-5, C5-6, C6-7  Postoperative diagnosis: Same  Procedure: Anterior cervical discectomies and fusion at C4-5, C5-6, C6-7 utilizing the globus titanium cages packed with locally harvested autograft mixed with vivigen.  2.  Anterior cervical plating utilizing the [employer] translational plating system  3.  Exploration fusion level hardware C3-4 with removal of globus plate  Surgeon: Jillyn Hidden Kemar Pandit  Assistant: Julien Girt  Anesthesia: General  EBL: Minimal  HPI: 61 year old male with progressive worsening neck pain bilateral shoulder and arm pain work-up revealed progressive spondylosis below the level of previous fusion at C4-5, C5-6, C6-7.  Due to patient progression of clinical syndrome imaging findings failed conservative treatment I recommended anterior cervical discectomies and fusion at those 2 levels.  I extensively reviewed the risks and benefits of the operation with him as well as perioperative course expectations of outcome and alternatives to surgery and he understood and agreed to proceed forward.  Operative procedure: Patient was brought into the OR was induced under general anesthesia positioned supine the neck in slight extension 5 pounds halter traction.  The right side was next prepped and draped in routine sterile fashion.  Preoperative x-ray localized the appropriate level so a curvilinear incision was made just off the midline to the entry border of the sternocleidomastoid and the superficial intestine status identified longitudinally.  The avascular plane between the sternomastoid and strap muscle was developed on the previous fascia improved and fascia was dissected away with Kitners.  Immediately identified the old plate at G4-0 this was dissected free and removed the fusion did appear to be solid.  Longus closing reflected laterally and self-retaining retractors were placed.  All 3 disc bases were incised  anterior osteophytes were bitten off the Leksell rongeur and a 2 and 3 Miller Kerrison punch.  All 3 disc bases were drilled down with a high-speed drill capturing the bone shavings and mucus trap.  Under microscopic lamination first working at C6-7 disc base was further drilled down large posterior osteophyte complexes and the markedly degenerated and collapsed disc were noted so this was drilled down the posterior annulus and ossific complex posteriorly removed in piecemeal fashion posterior laterally was identified and removed in piecemeal fashion large posterior spurs removed from both C6 and C7 vertebral eyes decompressed central canal.  Marching laterally both C7 pedicles were identified and both C7 nerve roots were decompressed and skeletonized flush with the pedicle.  This was packed with Gelfoam tension taken at C5-6 and C5-6 level disc base was again markedly collapsed large posterior spurs aggressively under Bitton decompressing central canal both C6 nerve roots were identified and skeletonized flush with the pedicle.  C4-5 disc base was splayed open some soft disc material migrating C5 foramen on the left but again central decompression was begun capturing all the bone shavings and mucus trap and aggressive under biting both endplates and marching laterally both C5 nerve roots were decompressed and skeletonized flush with the pedicle after adequate decompression been achieved centrally and foraminally, I sized 3 cages 6 mm at 4 5, 5 mm 5 6,  6 mm 67 cages were packed aggressively with autograft mixed with revision's more additional bone graft was packed laterally and anteriorly cage then a 3mm plate and utilizing the previous hose at C4 the all screws were placed all screws with excellent purchase locking mechanism was gauged.  Medical hemostasis was maintained postop fluoroscopy confirmed good position of all the implants I drain was placed and the wound was closed in  layers with interrupted Vicryl and  a running 4-0 subcuticular.  Dermabond benzoin Steri-Strips and a sterile dressing was applied patient recovery in stable condition.  At the end the case all needle counts and sponge counts were correct.

## 2021-11-05 NOTE — H&P (Signed)
Blake Vetrano is an 61 y.o. male.   Chief Complaint: Neck pain bilateral shoulder pain arm pain numbness in his hands HPI: 61 year old gentleman underwent previous ACDF at C3-4 for myelopathy and did fairly well with symptomatic improvement however has had progressive worsening neck and bilateral shoulder and arm pain with numbness tingling weakness in his grip and work-up has revealed progressive stenosis below his previous fusion at C4-5 C5-6 and C6-7.  Due to patient progression of clinical syndrome imaging findings and failed conservative treatment I recommended anterior cervical discectomies and fusion at those levels.  I have extensively gone over the risks and benefits of the operation with him as well as perioperative course expectations of outcome and alternatives of surgery and he understands and agrees to proceed forward.  Past Medical History:  Diagnosis Date   Arthritis    hands   Bilateral carpal tunnel syndrome 03/29/2020    Past Surgical History:  Procedure Laterality Date   ABCESS DRAINAGE     in the jaw area   ANTERIOR CERVICAL DECOMP/DISCECTOMY FUSION N/A 06/28/2020   Procedure: Anterior Cervical Decompression Fusion - Cervical three-four;  Surgeon: Donalee Citrin, MD;  Location: [facility] OR;  Service: Neurosurgery;  Laterality: N/A;   SPINE SURGERY  2021    Family History  Problem Relation Age of Onset   Colon cancer Neg Hx    Colon polyps Neg Hx    Esophageal cancer Neg Hx    Rectal cancer Neg Hx    Stomach cancer Neg Hx    Social History:  reports that he has been smoking cigarettes. He has a 12.50 pack-year smoking history. He has never used smokeless tobacco. He reports current alcohol use of about 4.0 standard drinks per week. He reports that he does not use drugs.  Allergies: No Known Allergies  Medications Prior to Admission  Medication Sig Dispense Refill   acetaminophen (TYLENOL) 500 MG tablet Take 1,000 mg by mouth every 6 (six) hours as needed for moderate pain.      cholecalciferol (VITAMIN D3) 25 MCG (1000 UT) tablet Take 1,000 Units by mouth daily.     Cyanocobalamin (VITAMIN B-12 PO) Take 1 tablet by mouth daily.     HYDROcodone-acetaminophen (NORCO/VICODIN) 5-325 MG tablet Take 2 tablets by mouth every 4 (four) hours as needed for severe pain ((score 7 to 10)). 30 tablet 0   Multiple Vitamin (MULTIVITAMIN) capsule Take 1 capsule by mouth daily.     pregabalin (LYRICA) 25 MG capsule Take 50 mg by mouth daily at 12 noon.     traMADol (ULTRAM) 50 MG tablet Take 50 mg by mouth every 6 (six) hours as needed for moderate pain.     Turmeric (QC TUMERIC COMPLEX PO) Take 1 capsule by mouth daily.     vitamin E 180 MG (400 UNITS) capsule Take 400 Units by mouth daily.     Na Sulfate-K Sulfate-Mg Sulf 17.5-3.13-1.6 GM/177ML SOLN Suprep (no substitutions)-TAKE AS DIRECTED. 354 mL 0    No results found for this or any previous visit (from the past 48 hour(s)). No results found.  Review of Systems  Musculoskeletal:  Positive for neck pain.  Neurological:  Positive for weakness and numbness.   Blood pressure 140/86, pulse 76, temperature 97.6 F (36.4 C), temperature source Oral, resp. rate 17, height 5\' 8"  (1.727 m), weight 65.8 kg, SpO2 97 %. Physical Exam HENT:     Head: Normocephalic.     Right Ear: Tympanic membrane normal.     Nose: Nose  normal.     Mouth/Throat:     Mouth: Mucous membranes are moist.  Eyes:     Pupils: Pupils are equal, round, and reactive to light.  Cardiovascular:     Rate and Rhythm: Normal rate.  Pulmonary:     Effort: Pulmonary effort is normal.  Abdominal:     General: Abdomen is flat.  Musculoskeletal:        General: Normal range of motion.  Skin:    General: Skin is warm.  Neurological:     Mental Status: He is alert.     Comments: Strength is 5 out of 5 deltoid some weakness in triceps 4+ out of 5 grip strength is also 4+ out of 5 otherwise upper extremity strength 5 out of 5     Assessment/Plan 61 year old  presents for expiration fusion removal hardware C3-4 and ACDF C4-5 C5-6 C6-7.  Mariam Dollar, MD 11/05/2021, 9:21 AM

## 2021-11-05 NOTE — Progress Notes (Signed)
Orthopedic Tech Progress Note Patient Details:  Mark Hampton 1960/12/02 [id number]  PACU RN called requesting a SOFT COLLAR for patient   Ortho Devices Type of Ortho Device: Soft collar Ortho Device/Splint Location: NECK Ortho Device/Splint Interventions: Ordered   Post Interventions Patient Tolerated: Well Instructions Provided: Care of device  [name] 11/05/2021, 2:01 PM

## 2021-11-05 NOTE — Anesthesia Procedure Notes (Signed)
Procedure Name: Intubation Date/Time: 11/05/2021 9:53 AM Performed by: Amadeo Garnet, CRNA Pre-anesthesia Checklist: Patient identified, Emergency Drugs available, Suction available and Patient being monitored Patient Re-evaluated:Patient Re-evaluated prior to induction Oxygen Delivery Method: Circle system utilized Preoxygenation: Pre-oxygenation with 100% oxygen Induction Type: IV induction Ventilation: Mask ventilation without difficulty Laryngoscope Size: Mac and 4 Grade View: Grade I Tube type: Oral Tube size: 7.5 mm Number of attempts: 1 Airway Equipment and Method: Stylet and Oral airway Placement Confirmation: ETT inserted through vocal cords under direct vision, positive ETCO2 and breath sounds checked- equal and bilateral Secured at: 22 cm Tube secured with: Tape Dental Injury: Teeth and Oropharynx as per pre-operative assessment

## 2021-11-05 NOTE — Anesthesia Postprocedure Evaluation (Signed)
Anesthesia Post Note  Patient: Mark Hampton  Procedure(s) Performed: Anterior Cervical Discectomy and Fusion Cervical Four-Five/Cervical Five-Six/Cervical Six-Seven with Hardware Removal Cervical Three-Four     Patient location during evaluation: PACU Anesthesia Type: General Level of consciousness: sedated Pain management: pain level controlled Vital Signs Assessment: post-procedure vital signs reviewed and stable Respiratory status: spontaneous breathing and respiratory function stable Cardiovascular status: stable Postop Assessment: no apparent nausea or vomiting Anesthetic complications: no   No notable events documented.  Last Vitals:  Vitals:   11/05/21 1400 11/05/21 1415  BP: 123/62 127/81  Pulse: 98 (!) 101  Resp: 12 12  Temp:    SpO2: 94% 97%    Last Pain:  Vitals:   11/05/21 1415  TempSrc:   PainSc: Asleep    LLE Motor Response: Purposeful movement (11/05/21 1415) LLE Sensation: Full sensation (11/05/21 1415) RLE Motor Response: Purposeful movement (11/05/21 1415) RLE Sensation: Full sensation (11/05/21 1415)      Allen Basista DANIEL

## 2021-11-06 ENCOUNTER — Encounter (HOSPITAL_COMMUNITY): Payer: Self-pay | Admitting: Neurosurgery

## 2021-11-06 MED ORDER — OXYCODONE HCL 10 MG PO TABS: 10.0000 mg | ORAL_TABLET | ORAL | 0 refills | Status: DC | PRN

## 2021-11-06 MED ORDER — CYCLOBENZAPRINE HCL 10 MG PO TABS: 10.0000 mg | ORAL_TABLET | Freq: Three times a day (TID) | ORAL | 0 refills | Status: DC | PRN

## 2021-11-06 MED FILL — Thrombin For Soln 5000 Unit: CUTANEOUS | Qty: 5000 | Status: AC

## 2021-11-06 NOTE — Discharge Summary (Signed)
°  Physician Discharge Summary  Patient ID: Mark Hampton MRN: [id number] DOB/AGE: 04-08-1961 61 y.o. Estimated body mass index is 22.05 kg/m as calculated from the following:   Height as of this encounter: 5\' 8"  (1.727 m).   Weight as of this encounter: 65.8 kg.   Admit date: 11/05/2021 Discharge date: 11/06/2021  Admission Diagnoses: Cervical radiculopathy  Discharge Diagnoses: Same Principal Problem:   Spinal stenosis of cervical region   Discharged Condition: good  Hospital Course: Patient was admitted and underwent anterior cervical discectomy and fusion at C4-5, C5-6, C6-7 postoperatively patient did very well recovered from the.  On the floor was ambulating and voiding spontaneously tolerating regular diet and stable for discharge home  Consults: Significant Diagnostic Studies: Treatments: ACDF C4-5, C5-6, C6-7 Discharge Exam: Blood pressure 113/71, pulse 84, temperature 99.3 F (37.4 C), temperature source Oral, resp. rate 16, height 5\' 8"  (1.727 m), weight 65.8 kg, SpO2 97 %. 4 to 4+ out of 5 grip strength neurologically at his baseline with cervical myelopathy  Disposition: home   Allergies as of 11/06/2021   No Known Allergies      Medication List     TAKE these medications    acetaminophen 500 MG tablet Commonly known as: TYLENOL Take 1,000 mg by mouth every 6 (six) hours as needed for moderate pain.   cholecalciferol 25 MCG (1000 UNIT) tablet Commonly known as: VITAMIN D3 Take 1,000 Units by mouth daily.   cyclobenzaprine 10 MG tablet Commonly known as: FLEXERIL Take 1 tablet (10 mg total) by mouth 3 (three) times daily as needed for muscle spasms.   HYDROcodone-acetaminophen 5-325 MG tablet Commonly known as: NORCO/VICODIN Take 2 tablets by mouth every 4 (four) hours as needed for severe pain ((score 7 to 10)).   multivitamin capsule Take 1 capsule by mouth daily.   Na Sulfate-K Sulfate-Mg Sulf 17.5-3.13-1.6 GM/177ML Soln Suprep (no  substitutions)-TAKE AS DIRECTED.   Oxycodone HCl 10 MG Tabs Take 1 tablet (10 mg total) by mouth every 3 (three) hours as needed for severe pain ((score 7 to 10)).   pregabalin 25 MG capsule Commonly known as: LYRICA Take 50 mg by mouth daily at 12 noon.   QC TUMERIC COMPLEX PO Take 1 capsule by mouth daily.   traMADol 50 MG tablet Commonly known as: ULTRAM Take 50 mg by mouth every 6 (six) hours as needed for moderate pain.   VITAMIN B-12 PO Take 1 tablet by mouth daily.   vitamin E 180 MG (400 UNITS) capsule Take 400 Units by mouth daily.         Signed: 11/08/2021 11/06/2021, 8:18 AM

## 2021-11-06 NOTE — Evaluation (Signed)
Physical Therapy Evaluation Patient Details Name: Mark Hampton MRN: [id number] DOB: [birth year] Today's Date: 11/06/2021  History of Present Illness  Pt is a 61 y/o male who presents s/p C4-C7 ACDF on 11/05/2021. PMH significant for bilateral carpal tunnel syndrome, C3-C4 ACDF 2021.   Clinical Impression  Pt admitted with above diagnosis. At the time of PT eval, pt was able to demonstrate transfers and ambulation with gross min guard assist to supervision for safety and [facility] for support. Pt was educated on precautions, brace application/wearing schedule, appropriate activity progression, and car transfer. Pt currently with functional limitations due to the deficits listed below (see PT Problem List). Pt will benefit from skilled PT to increase their independence and safety with mobility to allow discharge to the venue listed below.         Recommendations for follow up therapy are one component of a multi-disciplinary discharge planning process, led by the attending physician.  Recommendations may be updated based on patient status, additional functional criteria and insurance authorization.  Follow Up Recommendations Outpatient PT (vs. Outpatient OT. MD to order when appropriate per post-op protocol.)    Assistance Recommended at Discharge Intermittent Supervision/Assistance  Patient can return home with the following  A little help with walking and/or transfers;Assist for transportation;Help with stairs or ramp for entrance    Equipment Recommendations Cane  Recommendations for Other Services       Functional Status Assessment Patient has had a recent decline in their functional status and demonstrates the ability to make significant improvements in function in a reasonable and predictable amount of time.     Precautions / Restrictions Precautions Precautions: Fall;Cervical Precaution Booklet Issued: Yes (comment) Precaution Comments: Reviewed handout and pt was cued for precautions  during functional mobility. Required Braces or Orthoses: Cervical Brace Cervical Brace: Soft collar Restrictions Weight Bearing Restrictions: No      Mobility  Bed Mobility Overal bed mobility: Needs Assistance Bed Mobility: Rolling, Sidelying to Sit, Sit to Sidelying Rolling: Modified independent (Device/Increase time) Sidelying to sit: Supervision     Sit to sidelying: Supervision General bed mobility comments: Light supervision and cues for optimal log roll technique. Practiced with HOB almost flat and rails lowered to simulate home environment.    Transfers Overall transfer level: Needs assistance Equipment used: Straight cane Transfers: Sit to/from Stand Sit to Stand: Min guard           General transfer comment: Close guard for safety as pt powered up to full stand. Pt appeared mildly unsteady however able to recover without assist.    Ambulation/Gait Ambulation/Gait assistance: Supervision, Min guard Gait Distance (Feet): 300 Feet Assistive device: Straight cane Gait Pattern/deviations: Step-through pattern, Decreased stride length, Drifts right/left Gait velocity: Decreased Gait velocity interpretation: <1.8 ft/sec, indicate of risk for recurrent falls   General Gait Details: VC's for improved posture. Mildly unsteady with occasional LOB. Hands on guarding while pt recovered however no assist required. Pt prefers cane over the RW, and feel this is likely baseline or near baseline of function.  Stairs Stairs: Yes Stairs assistance: Min guard Stair Management: One rail Right, With cane, Alternating pattern, Forwards Number of Stairs: 10 General stair comments: VC's for sequencing and general safety. No assist required  Wheelchair Mobility    Modified Rankin (Stroke Patients Only)       Balance Overall balance assessment: Mild deficits observed, not formally tested  Pertinent Vitals/Pain  Pain Assessment Pain Assessment: 0-10 Pain Score: 9  Faces Pain Scale: Hurts little more Pain Location: Neck, shoulders Pain Descriptors / Indicators: Operative site guarding, Tingling, Sore, Numbness Pain Intervention(s): Limited activity within patient's tolerance, Monitored during session, Repositioned    Home Living Family/patient expects to be discharged to:: Private residence Living Arrangements: Spouse/significant other Available Help at Discharge: Family;Available PRN/intermittently Type of Home: House Home Access: Level entry     Alternate Level Stairs-Number of Steps: flight Home Layout: Two level;Bed/bath upstairs Home Equipment: Cane - single point;Tub bench;Hand held shower head;Adaptive equipment      Prior Function Prior Level of Function : Independent/Modified Independent             Mobility Comments: Utilizing a cane. Pt reports difficulty and increased time required due to pain/weakness (LUE worse than RUE), however was able to complete ADL's on his own. ADLs Comments: Pt enjoys cooking     Hand Dominance   Dominant Hand: Left    Extremity/Trunk Assessment   Upper Extremity Assessment Upper Extremity Assessment: LUE deficits/detail LUE Deficits / Details: Pt reports pain and burning sensation Lt shoulder to Lt hand which he reports has been present for 2 years    Lower Extremity Assessment Lower Extremity Assessment: Generalized weakness    Cervical / Trunk Assessment Cervical / Trunk Assessment: Neck Surgery  Communication   Communication: No difficulties  Cognition Arousal/Alertness: Awake/alert Behavior During Therapy: WFL for tasks assessed/performed Overall Cognitive Status: Within Functional Limits for tasks assessed                                          General Comments      Exercises     Assessment/Plan    PT Assessment Patient needs continued PT services  PT Problem List Decreased strength;Decreased  activity tolerance;Decreased balance;Decreased mobility;Decreased knowledge of use of DME;Decreased safety awareness;Decreased knowledge of precautions;Pain       PT Treatment Interventions DME instruction;Gait training;Stair training;Functional mobility training;Therapeutic activities;Therapeutic exercise;Neuromuscular re-education;Patient/family education;Balance training    PT Goals (Current goals can be found in the Care Plan section)  Acute Rehab PT Goals Patient Stated Goal: Home today, decrease pain PT Goal Formulation: With patient/family Time For Goal Achievement: 11/13/21 Potential to Achieve Goals: Good    Frequency Min 5X/week     Co-evaluation               AM-PAC PT "6 Clicks" Mobility  Outcome Measure Help needed turning from your back to your side while in a flat bed without using bedrails?: None Help needed moving from lying on your back to sitting on the side of a flat bed without using bedrails?: A Little Help needed moving to and from a bed to a chair (including a wheelchair)?: A Little Help needed standing up from a chair using your arms (e.g., wheelchair or bedside chair)?: A Little Help needed to walk in hospital room?: A Little Help needed climbing 3-5 steps with a railing? : A Little 6 Click Score: 19    End of Session Equipment Utilized During Treatment: Cervical collar Activity Tolerance: Patient tolerated treatment well Patient left: Other (comment) (Sitting EOB with wife present and OT entering room) Nurse Communication: Mobility status PT Visit Diagnosis: Unsteadiness on feet (R26.81);Pain Pain - part of body:  (neck, shoulders)    Time: [id number] PT Time Calculation (min) (ACUTE ONLY): 23 min  Charges:   PT Evaluation $PT Eval Low Complexity: 1 Low PT Treatments $Gait Training: 8-22 mins        Conni Slipper, PT, DPT Acute Rehabilitation Services Pager: [phone] Office: [phone]   Marylynn Pearson 11/06/2021, 9:42  AM

## 2021-11-06 NOTE — Evaluation (Signed)
Occupational Therapy Evaluation Patient Details Name: Mark StarchMyron Lecy MRN: [id number] DOB: October 26, 1960 Today's Date: 11/06/2021   History of Present Illness Pt is a 61 y/o male who presents s/p C4-C7 ACDF on 11/05/2021. PMH significant for bilateral carpal tunnel syndrome, C3-C4 ACDF 2021.   Clinical Impression   Pt admitted with the above diagnosis and demonstrates the below listed deficits.  He and wife were instructed in cervical precautions and safety with ADLs and IADLs.  Pt does have limited shoulder ROM which is long standing.  Feel he will benefit from OPOT once MD clears him for such to work on Lt UE function.  Pt is able to perform ADLs with supervision.  OT will sign off       Recommendations for follow up therapy are one component of a multi-disciplinary discharge planning process, led by the attending physician.  Recommendations may be updated based on patient status, additional functional criteria and insurance authorization.   Follow Up Recommendations  Outpatient OT (- instructed pt and wife to discuss OPOT with MD at follow up appointment)    Assistance Recommended at Discharge Intermittent Supervision/Assistance  Patient can return home with the following A little help with bathing/dressing/bathroom;Assist for transportation    Functional Status Assessment  Patient has had a recent decline in their functional status and demonstrates the ability to make significant improvements in function in a reasonable and predictable amount of time.  Equipment Recommendations  None recommended by OT    Recommendations for Other Services       Precautions / Restrictions Precautions Precautions: Fall;Cervical Precaution Booklet Issued: Yes (comment) Precaution Comments: Reviewed handout and pt was cued for precautions during functional mobility. Required Braces or Orthoses: Cervical Brace Cervical Brace: Soft collar Restrictions Weight Bearing Restrictions: No      Mobility Bed  Mobility Overal bed mobility: Needs Assistance Bed Mobility: Rolling, Sidelying to Sit, Sit to Sidelying Rolling: Modified independent (Device/Increase time) Sidelying to sit: Supervision     Sit to sidelying: Supervision General bed mobility comments: Pt requires cues to log roll    Transfers Overall transfer level: Needs assistance Equipment used: Straight cane Transfers: Sit to/from Stand, Bed to chair/wheelchair/BSC Sit to Stand: Min guard     Step pivot transfers: Min guard            Balance Overall balance assessment: Mild deficits observed, not formally tested                                         ADL either performed or assessed with clinical judgement   ADL Overall ADL's : Needs assistance/impaired Eating/Feeding: Modified independent;Sitting   Grooming: Wash/dry hands;Wash/dry face;Oral care;Brushing hair;Supervision/safety;Standing Grooming Details (indicate cue type and reason): Pt reports he has dentures.  Reviewed safety with shaving Upper Body Bathing: Supervision/ safety;Set up;Sitting   Lower Body Bathing: Supervison/ safety;Sit to/from stand   Upper Body Dressing : Set up;Supervision/safety;Sitting   Lower Body Dressing: Supervision/safety Lower Body Dressing Details (indicate cue type and reason): pt able to perform figure 4 Toilet Transfer: Supervision/safety;Ambulation;Comfort height toilet   Toileting- Clothing Manipulation and Hygiene: Supervision/safety;Sit to/from stand       Functional mobility during ADLs: Supervision/safety General ADL Comments: reiterated cervical precautions and safety with ADLs and IADLs.  Wife present     Vision         Perception     Praxis  Pertinent Vitals/Pain Pain Assessment Pain Assessment: Faces Faces Pain Scale: Hurts little more Pain Location: Neck, shoulders Pain Descriptors / Indicators: Operative site guarding, Tingling, Sore, Numbness Pain Intervention(s):  Monitored during session, Repositioned, Limited activity within patient's tolerance     Hand Dominance Left   Extremity/Trunk Assessment Upper Extremity Assessment Upper Extremity Assessment: LUE deficits/detail LUE Deficits / Details: Pt reports pain and burning sensation Lt shoulder to Lt hand which he reports has been present for 2 years.  Shoulder AROM/PROM limited 90-100* by pain and tightness.  Pt reports Lt UE unchanged from baseline LUE Coordination: decreased fine motor;decreased gross motor   Lower Extremity Assessment Lower Extremity Assessment: Defer to PT evaluation   Cervical / Trunk Assessment Cervical / Trunk Assessment: Neck Surgery   Communication Communication Communication: No difficulties   Cognition Arousal/Alertness: Awake/alert Behavior During Therapy: WFL for tasks assessed/performed Overall Cognitive Status: Within Functional Limits for tasks assessed                                       General Comments       Exercises     Shoulder Instructions      Home Living Family/patient expects to be discharged to:: Private residence Living Arrangements: Spouse/significant other Available Help at Discharge: Family;Available PRN/intermittently Type of Home: House Home Access: Level entry     Home Layout: Two level;Bed/bath upstairs Alternate Level Stairs-Number of Steps: flight Alternate Level Stairs-Rails: Right;Left;Can reach both Bathroom Shower/Tub: Tub/shower unit   Bathroom Toilet: Handicapped height ("comfort height" - a little lower than handicapped height)     Home Equipment: Cane - single point;Tub bench;Hand held shower head;Adaptive equipment Adaptive Equipment: Reacher        Prior Functioning/Environment Prior Level of Function : Independent/Modified Independent             Mobility Comments: Utilizing a cane. Pt reports difficulty and increased time required due to pain/weakness (LUE worse than RUE), however  was able to complete ADL's on his own. ADLs Comments: Pt enjoys cooking        OT Problem List: Decreased strength;Decreased range of motion;Decreased activity tolerance;Impaired balance (sitting and/or standing);Decreased coordination;Decreased knowledge of precautions;Impaired UE functional use;Pain      OT Treatment/Interventions:      OT Goals(Current goals can be found in the care plan section) Acute Rehab OT Goals Patient Stated Goal: to have less pain and to improve Lt UE function OT Goal Formulation: With patient/family  OT Frequency:      Co-evaluation              AM-PAC OT "6 Clicks" Daily Activity     Outcome Measure Help from another person eating meals?: None Help from another person taking care of personal grooming?: A Little Help from another person toileting, which includes using toliet, bedpan, or urinal?: A Little Help from another person bathing (including washing, rinsing, drying)?: A Little Help from another person to put on and taking off regular upper body clothing?: A Little Help from another person to put on and taking off regular lower body clothing?: A Little 6 Click Score: 19   End of Session Equipment Utilized During Treatment: Cervical collar Nurse Communication: Mobility status  Activity Tolerance: Patient tolerated treatment well Patient left: in bed;with call bell/phone within reach;with family/visitor present  OT Visit Diagnosis: Unsteadiness on feet (R26.81);Pain Pain - Right/Left: Left Pain - part of body: Shoulder (  neck)                Time: 6767-2094 OT Time Calculation (min): 11 min Charges:  OT General Charges $OT Visit: 1 Visit OT Evaluation $OT Eval Moderate Complexity: 1 Mod  Eber Jones., OTR/L Acute Rehabilitation Services Pager [phone] Office [phone]   Jeani Hawking M 11/06/2021, 10:14 AM

## 2021-11-06 NOTE — Discharge Instructions (Signed)

## 2021-11-06 NOTE — Progress Notes (Signed)
Patient awaiting transport via wheelchair by volunteer for discharge home; in no acute distress nor complaints of pain nor discomfort; incision on his anterior neck with honeycomb dressing and is clean, dry and intact with soft collar on; room was checked and accounted for all his belongings; discharge instructions concerning his medications, follow-up appointment, incision care and when to call the doctor as needed were all discussed with patient and his wife and both expressed understanding on the instructions given.

## 2021-11-06 NOTE — Plan of Care (Signed)

## 2021-11-07 ENCOUNTER — Telehealth: Payer: Self-pay | Admitting: *Deleted

## 2021-11-07 NOTE — Telephone Encounter (Signed)
Transition Care Management Follow-up Telephone Call Date of discharge and from where: 11/06/2021 Mark Hampton have you been since you were released from the hospital? weak Any questions or concerns? No  Items Reviewed: Did the pt receive and understand the discharge instructions provided? Yes  Medications obtained and verified? Yes  Other? No  Any new allergies since your discharge? No  Dietary orders reviewed? Yes Do you have support at home? Yes   Home Care and Equipment/Supplies: Were home health services ordered? yes If so, what is the name of the agency? Recommended to have Home Health PT  Has the agency set up a time to come to the patient's home? no Were any new equipment or medical supplies ordered?  No What is the name of the medical supply agency? [facility] Were you able to get the supplies/equipment? not applicable Do you have any questions related to the use of the equipment or supplies? No  Functional Questionnaire: (I = Independent and D = Dependent) ADLs: I with assistance  Bathing/Dressing- I with Assistance  Meal Prep- D  Eating- I  Maintaining continence- I  Transferring/Ambulation- I with assistance  Managing Meds- I  Follow up appointments reviewed:  PCP Hospital f/u appt confirmed? Yes  Scheduled to see Mark Hampton on 11/13/2021 @ 9:00. Specialist Hospital f/u appt confirmed? Yes   Are transportation arrangements needed? No  If their condition worsens, is the pt aware to call PCP or go to the Emergency Dept.? Yes Was the patient provided with contact information for the PCP's office or ED? Yes Was to pt encouraged to call back with questions or concerns? Yes

## 2021-11-13 ENCOUNTER — Encounter: Payer: Self-pay | Admitting: Family

## 2021-11-13 ENCOUNTER — Ambulatory Visit (INDEPENDENT_AMBULATORY_CARE_PROVIDER_SITE_OTHER): Payer: Medicaid Other | Admitting: Family

## 2021-11-13 ENCOUNTER — Other Ambulatory Visit: Payer: Self-pay

## 2021-11-13 VITALS — BP 126/80 | HR 77 | Temp 98.6°F | Resp 16 | Ht 68.0 in | Wt 148.2 lb

## 2021-11-13 DIAGNOSIS — Z9889 Other specified postprocedural states: Secondary | ICD-10-CM | POA: Diagnosis not present

## 2021-11-13 DIAGNOSIS — G959 Disease of spinal cord, unspecified: Secondary | ICD-10-CM

## 2021-11-13 DIAGNOSIS — G5620 Lesion of ulnar nerve, unspecified upper limb: Secondary | ICD-10-CM | POA: Insufficient documentation

## 2021-11-13 DIAGNOSIS — R200 Anesthesia of skin: Secondary | ICD-10-CM | POA: Diagnosis not present

## 2021-11-13 DIAGNOSIS — R202 Paresthesia of skin: Secondary | ICD-10-CM

## 2021-11-13 DIAGNOSIS — M542 Cervicalgia: Secondary | ICD-10-CM

## 2021-11-13 DIAGNOSIS — M4802 Spinal stenosis, cervical region: Secondary | ICD-10-CM | POA: Diagnosis not present

## 2021-11-13 LAB — CBC WITH DIFFERENTIAL/PLATELET
Absolute Monocytes: 834 cells/uL (ref 200–950)
Basophils Absolute: 31 cells/uL (ref 0–200)
Basophils Relative: 0.3 %
Eosinophils Absolute: 196 cells/uL (ref 15–500)
Eosinophils Relative: 1.9 %
HCT: 38.3 % — ABNORMAL LOW (ref 38.5–50.0)
Hemoglobin: 13.1 g/dL — ABNORMAL LOW (ref 13.2–17.1)
Lymphs Abs: 2338 cells/uL (ref 850–3900)
MCH: 33.3 pg — ABNORMAL HIGH (ref 27.0–33.0)
MCHC: 34.2 g/dL (ref 32.0–36.0)
MCV: 97.5 fL (ref 80.0–100.0)
MPV: 9.4 fL (ref 7.5–12.5)
Monocytes Relative: 8.1 %
Neutro Abs: 6901 cells/uL (ref 1500–7800)
Neutrophils Relative %: 67 %
Platelets: 339 10*3/uL (ref 140–400)
RBC: 3.93 10*6/uL — ABNORMAL LOW (ref 4.20–5.80)
RDW: 12.1 % (ref 11.0–15.0)
Total Lymphocyte: 22.7 %
WBC: 10.3 10*3/uL (ref 3.8–10.8)

## 2021-11-13 LAB — BASIC METABOLIC PANEL WITH GFR
BUN: 13 mg/dL (ref 7–25)
CO2: 30 mmol/L (ref 20–32)
Calcium: 10 mg/dL (ref 8.6–10.3)
Chloride: 107 mmol/L (ref 98–110)
Creat: 0.98 mg/dL (ref 0.70–1.35)
Glucose, Bld: 85 mg/dL (ref 65–99)
Potassium: 4.4 mmol/L (ref 3.5–5.3)
Sodium: 141 mmol/L (ref 135–146)
eGFR: 87 mL/min/{1.73_m2} (ref >=60)

## 2021-11-13 MED ORDER — CYCLOBENZAPRINE HCL 10 MG PO TABS: 10.0000 mg | ORAL_TABLET | Freq: Three times a day (TID) | ORAL | 3 refills | Status: DC | PRN

## 2021-11-13 MED ORDER — HYDROCODONE-ACETAMINOPHEN 5-325 MG PO TABS: 2.0000 | ORAL_TABLET | ORAL | 0 refills | Status: DC | PRN

## 2021-11-13 NOTE — Progress Notes (Signed)
Provider: Justin Meisenheimer FNP-C  Wardell Honour, MD  Patient Care Team: Wardell Honour, MD as PCP - General [facility])  Extended Emergency Contact Information Primary Emergency Contact: [facility] Address: [address] [place], [place] [address] Johnnette Litter of [place] Phone: [phone] Mobile Phone: [phone] Relation: Spouse  Code Status:  Full Code  Goals of care: Advanced Directive information Advanced Directives 11/13/2021  Does Patient Have a Medical Advance Directive? No  Would patient like information on creating a medical advance directive? No - Patient declined     Chief Complaint  Patient presents with   Transitions Of Care    [facility] 10/26/2021-11/06/2021    HPI:  Pt is a 61 y.o. male seen today for an acute visit for Transition of care post Hospitalization from 11/05/2021 to 11/06/2021 for cervical radiculopathy.He underwent anterior cervical discectomy and fusion at C 4-5,C 5-6,C 6 - 7. He did well post-op and was discharged home to follow up with   Here with wife mary who provides additional HPI information.  He states still has pain on back,shoulders and left arm.He describes pain as stingy and numb sensation.rates 7-8 on scale of 10.also states left hand fingers still feel numb and the tips of right fingers too are numb.  He continues to require pain medication.request refill for hydrocodone and flexeril.  Wife request work excuse note for today since she had to bring husband to office today for visit.will return to work tomorrow.   Past Medical History:  Diagnosis Date   Arthritis    hands   Bilateral carpal tunnel syndrome 03/29/2020   Past Surgical History:  Procedure Laterality Date   ABCESS DRAINAGE     in the jaw area   ANTERIOR CERVICAL DECOMP/DISCECTOMY FUSION N/A 06/28/2020   Procedure: Anterior Cervical Decompression Fusion - Cervical three-four;  Surgeon: Kary Kos, MD;  Location: [place];  Service:  Neurosurgery;  Laterality: N/A;   ANTERIOR CERVICAL DECOMP/DISCECTOMY FUSION N/A 11/05/2021   Procedure: Anterior Cervical Discectomy and Fusion Cervical Four-Five/Cervical Five-Six/Cervical Six-Seven with Hardware Removal Cervical Three-Four;  Surgeon: Kary Kos, MD;  Location: [place];  Service: Neurosurgery;  Laterality: N/A;   SPINE SURGERY  2021    No Known Allergies  Outpatient Encounter Medications as of 11/13/2021  Medication Sig   acetaminophen (TYLENOL) 500 MG tablet Take 1,000 mg by mouth every 6 (six) hours as needed for moderate pain.   cholecalciferol (VITAMIN D3) 25 MCG (1000 UT) tablet Take 1,000 Units by mouth daily.   Cyanocobalamin (VITAMIN B-12 PO) Take 1 tablet by mouth daily.   cyclobenzaprine (FLEXERIL) 10 MG tablet Take 1 tablet (10 mg total) by mouth 3 (three) times daily as needed for muscle spasms.   HYDROcodone-acetaminophen (NORCO/VICODIN) 5-325 MG tablet Take 2 tablets by mouth every 4 (four) hours as needed for severe pain ((score 7 to 10)).   Multiple Vitamin (MULTIVITAMIN) capsule Take 1 capsule by mouth daily.   Na Sulfate-K Sulfate-Mg Sulf 17.5-3.13-1.6 GM/177ML SOLN Suprep (no substitutions)-TAKE AS DIRECTED.   oxyCODONE 10 MG TABS Take 1 tablet (10 mg total) by mouth every 3 (three) hours as needed for severe pain ((score 7 to 10)).   pregabalin (LYRICA) 25 MG capsule Take 50 mg by mouth daily at 12 noon.   traMADol (ULTRAM) 50 MG tablet Take 50 mg by mouth every 6 (six) hours as needed for moderate pain.   Turmeric (QC TUMERIC COMPLEX PO) Take 1 capsule  by mouth daily.   vitamin E 180 MG (400 UNITS) capsule Take 400 Units by mouth daily.   No facility-administered encounter medications on file as of 11/13/2021.    Review of Systems  Constitutional:  Negative for appetite change, chills, fatigue, fever and unexpected weight change.  HENT:  Negative for congestion, dental problem, ear discharge, ear pain, facial swelling, hearing loss, nosebleeds, postnasal  drip, rhinorrhea, sinus pressure, sinus pain, sneezing, sore throat, tinnitus and trouble swallowing.   Eyes:  Negative for pain, discharge, redness, itching and visual disturbance.  Respiratory:  Negative for cough, chest tightness, shortness of breath and wheezing.   Cardiovascular:  Negative for chest pain, palpitations and leg swelling.  Gastrointestinal:  Negative for abdominal distention, abdominal pain, blood in stool, constipation, diarrhea, nausea and vomiting.  Endocrine: Negative for cold intolerance, heat intolerance, polydipsia, polyphagia and polyuria.  Genitourinary:  Negative for difficulty urinating, dysuria, flank pain, frequency and urgency.  Musculoskeletal:  Positive for arthralgias, gait problem and neck pain. Negative for back pain, joint swelling, myalgias and neck stiffness.       Walks with a cane   Skin:  Negative for color change, pallor and rash.       Right anterior surgical incision with steri-stirps   Neurological:  Positive for weakness and numbness. Negative for dizziness, syncope, speech difficulty, light-headedness and headaches.       Left hand and tips of right fingers  Left hand weakness   Hematological:  Does not bruise/bleed easily.  Psychiatric/Behavioral:  Negative for agitation, behavioral problems, confusion, hallucinations, self-injury, sleep disturbance and suicidal ideas. The patient is not nervous/anxious.    Immunization History  Administered Date(s) Administered   Influenza,inj,Quad PF,6+ Mos 11/16/2019   PFIZER(Purple Top)SARS-COV-2 Vaccination 01/22/2020, 02/15/2020   Tdap 11/16/2019   Pertinent  Health Maintenance Due  Topic Date Due   COLONOSCOPY (Pts 45-58yr Insurance coverage will need to be confirmed)  Never done   INFLUENZA VACCINE  05/21/2021   Fall Risk 10/23/2021 11/05/2021 11/05/2021 11/06/2021 11/13/2021  Falls in the past year? 1 - - - 0  Was there an injury with Fall? 1 - - - 0  Fall Risk Category Calculator 2 - - - 0  Fall  Risk Category Moderate - - - Low  Patient Fall Risk Level Low fall risk Moderate fall risk Moderate fall risk Moderate fall risk Low fall risk  Patient at Risk for Falls Due to History of fall(s) - - - No Fall Risks  Fall risk Follow up Falls evaluation completed;Education provided;Falls prevention discussed - - - Falls evaluation completed   Functional Status Survey:    Vitals:   11/13/21 0912  BP: 126/80  Resp: 16  Temp: 98.6 F (37 C)  Weight: 148 lb 3.2 oz (67.2 kg)  Height: '5\' 8"'  (1.727 m)   Body mass index is 22.53 kg/m. Physical Exam Vitals reviewed.  Constitutional:      General: He is not in acute distress.    Appearance: Normal appearance. He is normal weight. He is not ill-appearing or diaphoretic.  HENT:     Head: Normocephalic.     Right Ear: Tympanic membrane, ear canal and external ear normal. There is no impacted cerumen.     Left Ear: Tympanic membrane, ear canal and external ear normal. There is no impacted cerumen.     Nose: Nose normal. No congestion or rhinorrhea.     Mouth/Throat:     Mouth: Mucous membranes are moist.     Pharynx:  Oropharynx is clear. No oropharyngeal exudate or posterior oropharyngeal erythema.  Eyes:     General: No scleral icterus.       Right eye: No discharge.        Left eye: No discharge.     Extraocular Movements: Extraocular movements intact.     Conjunctiva/sclera: Conjunctivae normal.     Pupils: Pupils are equal, round, and reactive to light.  Neck:     Vascular: No carotid bruit.     Comments: Right neck surgical incision with steri-strips intact.Neck collar in place  Cardiovascular:     Rate and Rhythm: Normal rate and regular rhythm.     Pulses: Normal pulses.     Heart sounds: Normal heart sounds. No murmur heard.   No friction rub. No gallop.  Pulmonary:     Effort: Pulmonary effort is normal. No respiratory distress.     Breath sounds: Normal breath sounds. No wheezing, rhonchi or rales.  Chest:     Chest  wall: No tenderness.  Abdominal:     General: Bowel sounds are normal. There is no distension.     Palpations: Abdomen is soft. There is no mass.     Tenderness: There is no abdominal tenderness. There is no right CVA tenderness, left CVA tenderness, guarding or rebound.  Musculoskeletal:        General: No swelling or tenderness.     Cervical back: No rigidity.     Right lower leg: No edema.     Left lower leg: No edema.     Comments: Neck collar in place   Lymphadenopathy:     Cervical: No cervical adenopathy.  Skin:    General: Skin is warm and dry.     Coloration: Skin is not pale.     Findings: No bruising, erythema, lesion or rash.     Comments: Right neck surgical incision with steri-strips intact.surrounding skin without any signs of infection.   Neurological:     Mental Status: He is alert and oriented to person, place, and time.     Cranial Nerves: No cranial nerve deficit.     Sensory: No sensory deficit.     Coordination: Coordination normal.     Gait: Gait abnormal.     Comments: Left hand grip weakness noted   Psychiatric:        Mood and Affect: Mood normal.        Speech: Speech normal.        Behavior: Behavior normal.        Thought Content: Thought content normal.        Judgment: Judgment normal.    Labs reviewed: No results for input(s): NA, K, CL, CO2, GLUCOSE, BUN, CREATININE, CALCIUM, MG, PHOS in the last 8760 hours. No results for input(s): AST, ALT, ALKPHOS, BILITOT, PROT, ALBUMIN in the last 8760 hours. Recent Labs    10/29/21 1145  WBC 7.4  HGB 14.2  HCT 41.9  MCV 99.5  PLT 260   Lab Results  Component Value Date   TSH 0.912 03/07/2020   Lab Results  Component Value Date   HGBA1C 5.3 03/07/2020   No results found for: CHOL, HDL, LDLCALC, LDLDIRECT, TRIG, CHOLHDL  Significant Diagnostic Results in last 30 days:  DG Cervical Spine 2 or 3 views  Result Date: 11/05/2021 CLINICAL DATA:  Anterior cervical discectomy infusion. EXAM:  CERVICAL SPINE - 2-3 VIEW COMPARISON:  CT cervical spine 03/05/2021 FINDINGS: 2 intraoperative spot fluoro views are submitted. Film labeled "3"  shows interval discectomy at C5-6 and C6-7 with lower cervical levels obscured by the patient's shoulders. Anterior plate seen previously is C3-4 has been removed. Film labeled "for" is centered over the lower cervical spine although bony anatomy not well demonstrated. IMPRESSION: Intraoperative evaluation during anterior cervical discectomy and fusion with anterior plate. Electronically Signed   By: Misty Stanley M.D.   On: 11/05/2021 12:54   DG C-Arm 1-60 Min-No Report  Result Date: 11/05/2021 Fluoroscopy was utilized by the requesting physician.  No radiographic interpretation.   DG C-Arm 1-60 Min-No Report  Result Date: 11/05/2021 Fluoroscopy was utilized by the requesting physician.  No radiographic interpretation.   DG C-Arm 1-60 Min-No Report  Result Date: 11/05/2021 Fluoroscopy was utilized by the requesting physician.  No radiographic interpretation.    Assessment/Plan 1. Spinal stenosis of cervical region S/p post anterior cervical discectomy and fusion at C 4-5,C 5-6,C 6 - 7 done on 11/05/2021 by Franco Nones.  - HYDROcodone-acetaminophen (NORCO/VICODIN) 5-325 MG tablet; Take 2 tablets by mouth every 4 (four) hours as needed for severe pain ((score 7 to 10)).  Dispense: 60 tablet; Refill: 0 - cyclobenzaprine (FLEXERIL) 10 MG tablet; Take 1 tablet (10 mg total) by mouth 3 (three) times daily as needed for muscle spasms.  Dispense: 30 tablet; Refill: 3 - CBC with Differential/Platelet - BMP with eGFR([facility])  2. Myelopathy (HCC) Continue current pain regimen - HYDROcodone-acetaminophen (NORCO/VICODIN) 5-325 MG tablet; Take 2 tablets by mouth every 4 (four) hours as needed for severe pain ((score 7 to 10)).  Dispense: 60 tablet; Refill: 0 - cyclobenzaprine (FLEXERIL) 10 MG tablet; Take 1 tablet (10 mg total) by mouth 3 (three) times daily as  needed for muscle spasms.  Dispense: 30 tablet; Refill: 3 - CBC with Differential/Platelet - BMP with eGFR([facility])  3. Status post cervical discectomy S/p post anterior cervical discectomy and fusion at C 4-5,C 5-6,C 6 - 7 done on 11/05/2021 by Dr.Cram Dominica Severin - right anterior neck surgical site with steri-strips intact.No drainage or signs of infection noted. - continue to wear surgical collar  - Follow up with Neurosurgery as directed  - HYDROcodone-acetaminophen (NORCO/VICODIN) 5-325 MG tablet; Take 2 tablets by mouth every 4 (four) hours as needed for severe pain ((score 7 to 10)).  Dispense: 60 tablet; Refill: 0 - cyclobenzaprine (FLEXERIL) 10 MG tablet; Take 1 tablet (10 mg total) by mouth 3 (three) times daily as needed for muscle spasms.  Dispense: 30 tablet; Refill: 3 - CBC with Differential/Platelet - BMP with eGFR([facility])  4. Numbness and tingling in right hand Worst on left hand and right tip of fingers.  5. Neck pain Continue current pain regimen   Family/ staff Communication: Reviewed plan of care with patient and wife verbalized understanding   Labs/tests ordered:  - CBC with Differential/Platelet - BMP with eGFR([facility])  Next Appointment: Has appointment with Dr.Miller in place   Sandrea Hughs, NP

## 2021-11-26 ENCOUNTER — Other Ambulatory Visit: Payer: Self-pay | Admitting: Family

## 2021-11-26 DIAGNOSIS — G959 Disease of spinal cord, unspecified: Secondary | ICD-10-CM

## 2021-11-26 DIAGNOSIS — Z9889 Other specified postprocedural states: Secondary | ICD-10-CM

## 2021-11-26 DIAGNOSIS — M4802 Spinal stenosis, cervical region: Secondary | ICD-10-CM

## 2021-11-26 NOTE — Telephone Encounter (Signed)
Patient called requesting refill.  Stated that he has ran out. Taking 2 every 4 hours for pain. Is it ok for him to have #120 (pended for approval) Epic LR: 11/13/21  #60 given Contract date: 11/13/21  Pended Rx and sent to Dr. Hyacinth Meeker for approval.

## 2021-11-27 ENCOUNTER — Other Ambulatory Visit: Payer: Self-pay | Admitting: Family Medicine

## 2021-11-27 DIAGNOSIS — M4802 Spinal stenosis, cervical region: Secondary | ICD-10-CM

## 2021-11-27 DIAGNOSIS — G959 Disease of spinal cord, unspecified: Secondary | ICD-10-CM

## 2021-11-27 DIAGNOSIS — Z9889 Other specified postprocedural states: Secondary | ICD-10-CM

## 2021-11-27 MED ORDER — HYDROCODONE-ACETAMINOPHEN 5-325 MG PO TABS: 1.0000 | ORAL_TABLET | Freq: Four times a day (QID) | ORAL | 0 refills | Status: DC | PRN

## 2021-11-27 NOTE — Addendum Note (Signed)
Addended by: Nelda Severe A on: 11/27/2021 08:48 AM   Modules accepted: Orders

## 2021-11-27 NOTE — Progress Notes (Unsigned)
ambul

## 2021-11-27 NOTE — Progress Notes (Signed)
Refiller narcotic with cautions about continued use/need

## 2021-11-27 NOTE — Telephone Encounter (Signed)
Mark Kuster, MD  You 32 minutes ago (8:10 AM)   He is reqiuring more narcotic than I am comfortable prescribing. If this need persists needs referral to pain management      Patient notified and stated that he would like a referral to Pain Management.   Pended Referral and sent to Dr. Hyacinth Meeker for approval.

## 2021-11-28 ENCOUNTER — Other Ambulatory Visit: Payer: Self-pay | Admitting: Family Medicine

## 2021-11-28 DIAGNOSIS — Z9889 Other specified postprocedural states: Secondary | ICD-10-CM

## 2021-12-21 ENCOUNTER — Other Ambulatory Visit: Payer: Self-pay | Admitting: Neurosurgery

## 2021-12-21 DIAGNOSIS — M5412 Radiculopathy, cervical region: Secondary | ICD-10-CM

## 2022-01-14 ENCOUNTER — Ambulatory Visit
Admission: RE | Admit: 2022-01-14 | Discharge: 2022-01-14 | Disposition: A | Discharge status: Home / Self Care | Payer: Medicaid Other | Source: Ambulatory Visit | Attending: Neurosurgery | Admitting: Neurosurgery

## 2022-01-14 DIAGNOSIS — M5412 Radiculopathy, cervical region: Secondary | ICD-10-CM

## 2022-01-14 IMAGING — CT CT CERVICAL SPINE W/O CM
3 of 5 series · 11 of 33 positions shown, 13 images · non-contrast
Comparison: Radiography [DATE]. MRI [DATE]. CT [DATE].

CLINICAL DATA: Radiculopathy, bilateral arm tingling and pain.
Numbness in stinging on the left. Previous discectomy and fusion.



[Series 5: c-spine 2.00 br60 s3 sag bone · sagittal · 0.26mm/px · 5 of 69 slices shown, 6 images]
[im 23/69  bone]
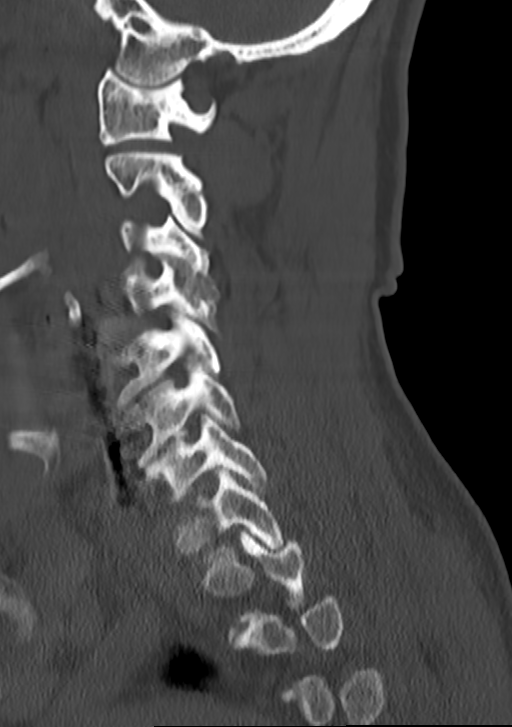
[im 29/69  bone]
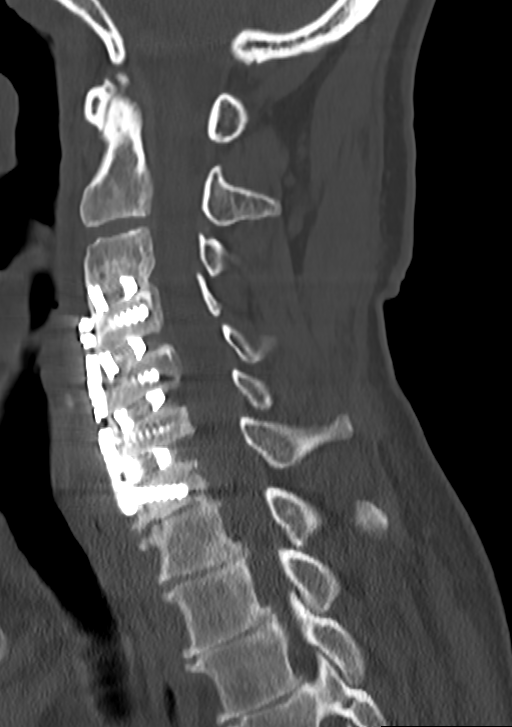
[im 35/69  soft-tissue]
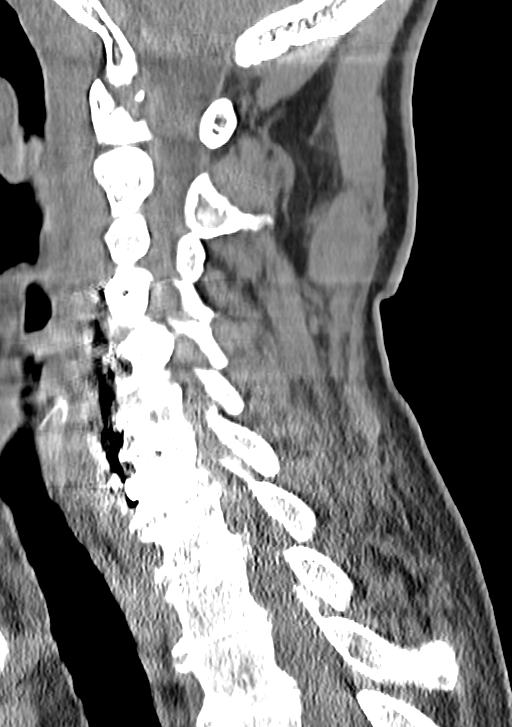
[im 35/69  bone]
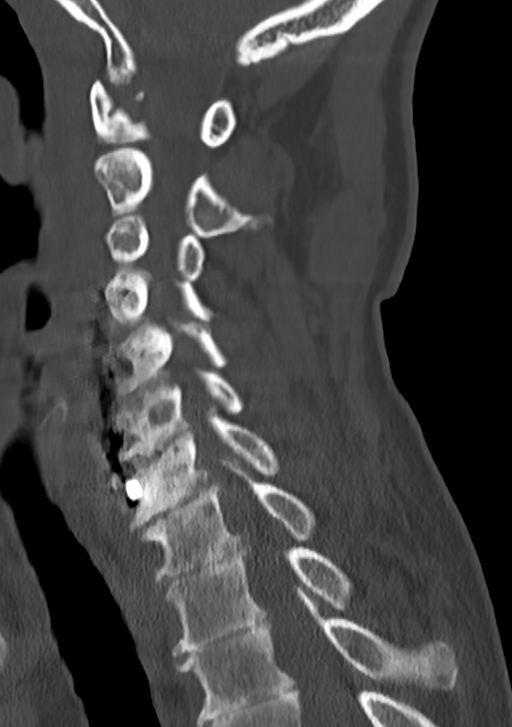
[im 40/69  bone]
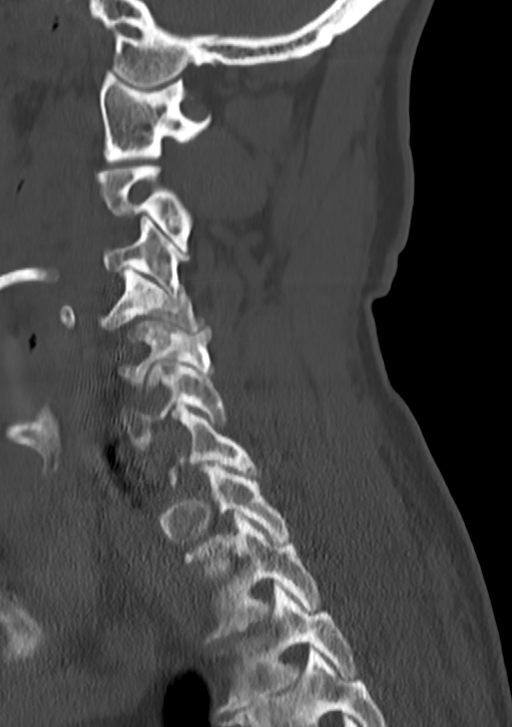
[im 46/69  bone]
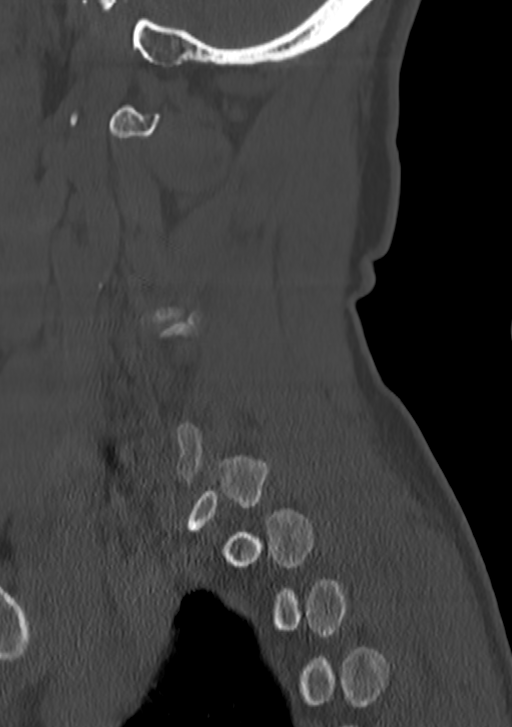

[Series 7: c-spine 2.00 hr60 s3 cor bone · coronal · 0.27mm/px · 3 of 70 slices shown]
[im 14/70  bone]
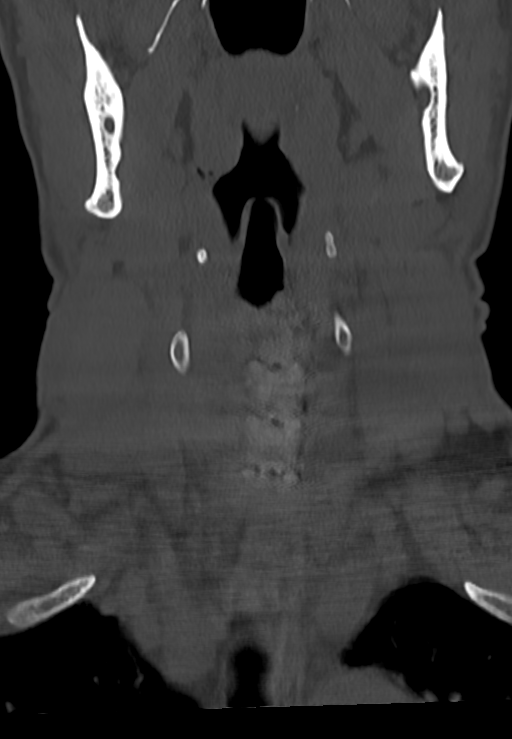
[im 28/70  bone]
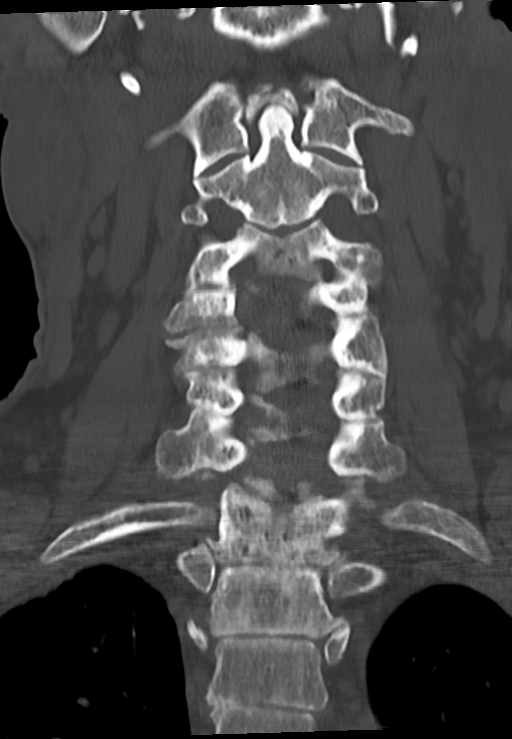
[im 42/70  bone]
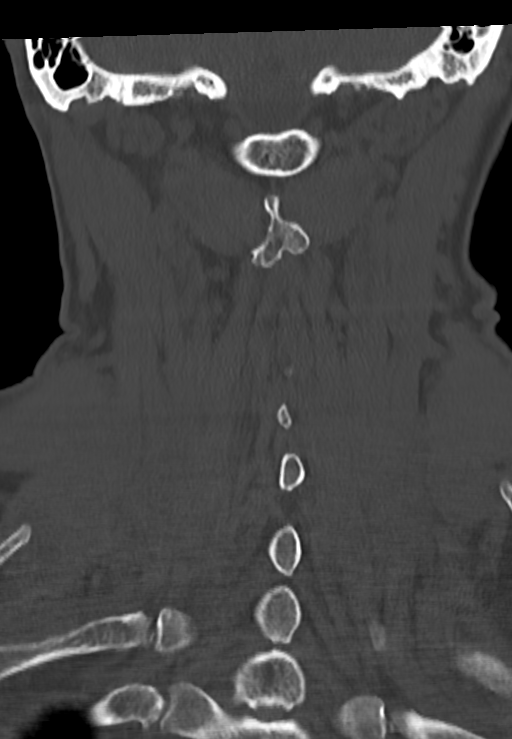

[Series 10: c-spine 2.00 hr60 s3 orthogonal axial bone · axial · 0.27mm/px · z∈[-780,-690]mm · 3 of 96 slices shown, 4 images]
[im 24/96  soft-tissue]
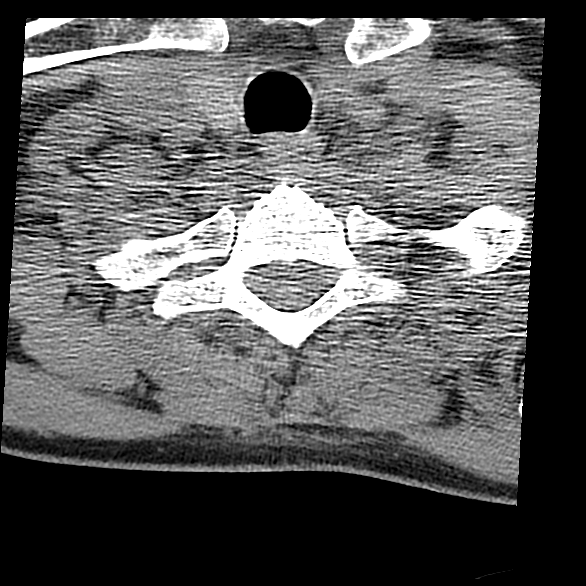
[im 24/96  bone]
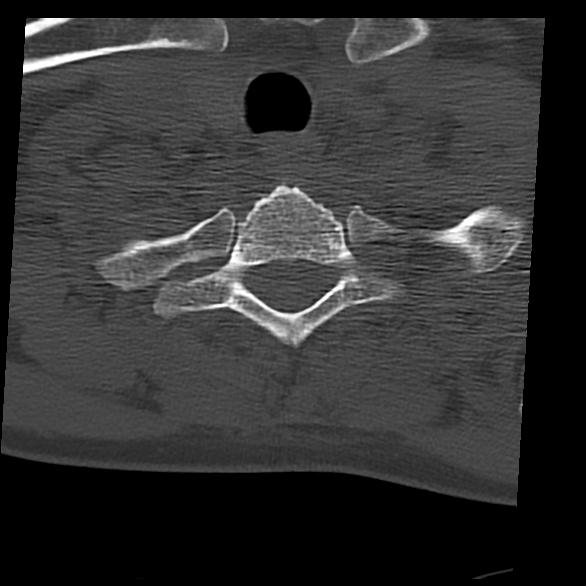
[im 48/96  bone]
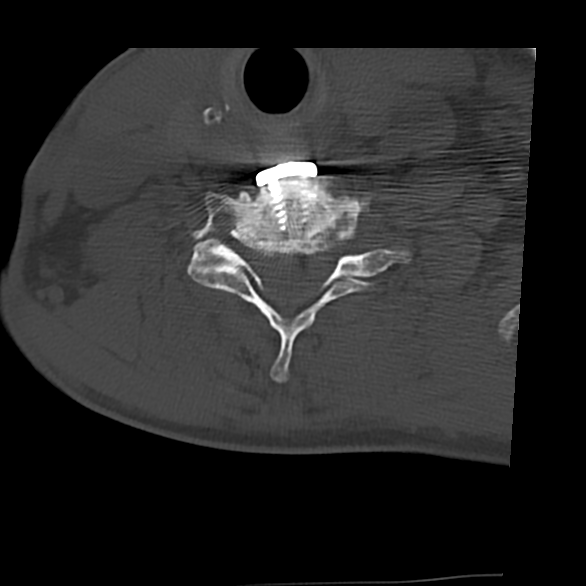
[im 72/96  bone]
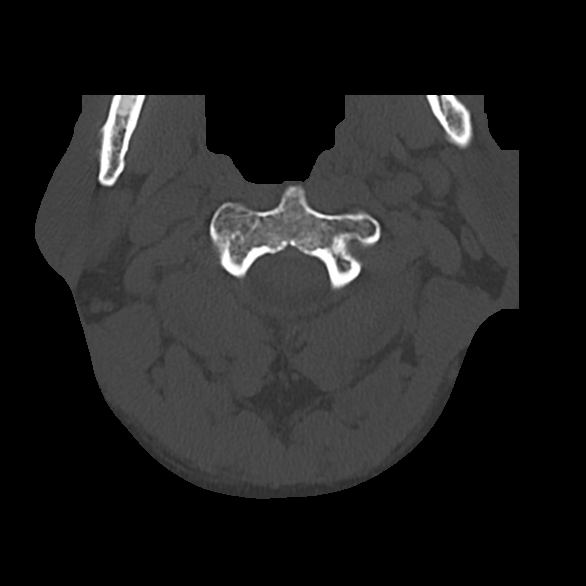

[11 of 33 positions shown; findings below may reference images not displayed]

FINDINGS: Alignment: Straightening of the normal cervical lordosis.

Skull base and vertebrae: Previous discectomy and fusion from C3
through C7. Removal of the anterior plate and screws at C3-4 but
with solid union. Discectomies with interbody spacers, anterior
plate and screws from C4 through C7. No subsidence or evidence screw
loosening.

Soft tissues and spinal canal: Negative

Disc levels: The foramen magnum is widely patent. There is ordinary
mild osteoarthritis of the C1-2 articulation but no encroachment
upon the neural structures.

C2-3: Normal

C3-4: As above, solid union with wide patency of the canal and
foramina.

C4 through C7: As above, anterior cervical discectomy and fusion
procedure. Foraminal stenosis on the right at C4-5 because of
osteophytic encroachment. Sufficient patency of the canal and
foramina at C5-6 and C6-7.

At C7-T1, there is bony foraminal narrowing on the right.

Upper chest: Negative

Other: None
IMPRESSION: Previous ACDF C3 through C7. No evidence of hardware malposition or
loosening. Sufficient patency of the canal. Bony foraminal narrowing
on the right at C4-5 and on the right at C7-T1.

## 2022-03-27 ENCOUNTER — Ambulatory Visit: Payer: Medicaid Other | Admitting: Family Medicine

## 2022-04-24 ENCOUNTER — Ambulatory Visit: Payer: Medicaid Other | Admitting: Family Medicine

## 2022-05-14 ENCOUNTER — Ambulatory Visit: Payer: Medicaid Other | Admitting: Family Medicine

## 2022-05-29 ENCOUNTER — Encounter: Payer: Self-pay | Admitting: Family Medicine

## 2022-05-29 ENCOUNTER — Ambulatory Visit (INDEPENDENT_AMBULATORY_CARE_PROVIDER_SITE_OTHER): Payer: Medicaid Other | Admitting: Family Medicine

## 2022-05-29 VITALS — BP 130/86 | HR 80 | Temp 96.8°F | Ht 68.0 in | Wt 141.0 lb

## 2022-05-29 DIAGNOSIS — G5603 Carpal tunnel syndrome, bilateral upper limbs: Secondary | ICD-10-CM | POA: Diagnosis not present

## 2022-05-29 DIAGNOSIS — R634 Abnormal weight loss: Secondary | ICD-10-CM

## 2022-05-29 NOTE — Progress Notes (Signed)
Provider:  Jacalyn Lefevre, MD  Careteam: Patient Care Team: Frederica Kuster, MD as PCP - General (Family Medicine)  PLACE OF SERVICE:  [facility]  Advanced Directive information    No Known Allergies  Chief Complaint  Patient presents with   Medical Management of Chronic Issues    Patient presents today for a 7 month follow-up.   Quality Metric Gaps    HIV & Hep C screening, colonoscopy, zoster, COVID booster #3     HPI: Patient is a 61 y.o. male patient is here for 55-month follow-up.  He generally has is healthy but has had lots of problems with the nerves in his neck and arms.  He has a history of spinal stenosis in the cervical region as well as ulnar neuropathy and myelopathy.  Recently had what sounds like an EMG. Also complains of some loss of balance.  He describes an old injury with his foot that he thinks affects his balance.  Has seen orthopedic doctor who said that he was a surgical candidate to repair the foot. Also had been previously scheduled for colonoscopy but if for some reason that did not happen. In looking over his vital signs there is a 7 pound weight loss in the last 6 months.  This was unintended.  Says appetite is normal.  No change in bowel or bladder habits and no abdominal pains.  Review of Systems:  Review of Systems  Constitutional:  Positive for weight loss.  Respiratory: Negative.    Cardiovascular: Negative.   Genitourinary: Negative.   Musculoskeletal: Negative.   Neurological:  Positive for tingling.  Psychiatric/Behavioral: Negative.    All other systems reviewed and are negative.   Past Medical History:  Diagnosis Date   Arthritis    hands   Bilateral carpal tunnel syndrome 03/29/2020   Past Surgical History:  Procedure Laterality Date   ABCESS DRAINAGE     in the jaw area   ANTERIOR CERVICAL DECOMP/DISCECTOMY FUSION N/A 06/28/2020   Procedure: Anterior Cervical Decompression Fusion - Cervical three-four;  Surgeon: Donalee Citrin, MD;  Location: [facility] OR;  Service: Neurosurgery;  Laterality: N/A;   ANTERIOR CERVICAL DECOMP/DISCECTOMY FUSION N/A 11/05/2021   Procedure: Anterior Cervical Discectomy and Fusion Cervical Four-Five/Cervical Five-Six/Cervical Six-Seven with Hardware Removal Cervical Three-Four;  Surgeon: Donalee Citrin, MD;  [facility];  Service: Neurosurgery;  Laterality: N/A;   SPINE SURGERY  2021   Social History:   reports that he has been smoking cigarettes. He has a 12.50 pack-year smoking history. He has never used smokeless tobacco. He reports current alcohol use of about 4.0 standard drinks of alcohol per week. He reports that he does not use drugs.  Family History  Problem Relation Age of Onset   Colon cancer Neg Hx    Colon polyps Neg Hx    Esophageal cancer Neg Hx    Rectal cancer Neg Hx    Stomach cancer Neg Hx     Medications: Patient's Medications  New Prescriptions   No medications on file  Previous Medications   CHOLECALCIFEROL (VITAMIN D3) 25 MCG (1000 UT) TABLET    Take 1,000 Units by mouth daily.   CYANOCOBALAMIN (VITAMIN B-12 PO)    Take 1 tablet by mouth daily.   CYCLOBENZAPRINE (FLEXERIL) 10 MG TABLET    Take 1 tablet (10 mg total) by mouth 3 (three) times daily as needed for muscle spasms.   HYDROCODONE-ACETAMINOPHEN (NORCO/VICODIN) 5-325 MG TABLET    Take 1-2 tablets by mouth  every 6 (six) hours as needed for moderate pain.   MULTIPLE VITAMIN (MULTIVITAMIN) CAPSULE    Take 1 capsule by mouth daily.   NA SULFATE-K SULFATE-MG SULF 17.5-3.13-1.6 GM/177ML SOLN    Suprep (no substitutions)-TAKE AS DIRECTED.   PREGABALIN (LYRICA) 25 MG CAPSULE    Take 50 mg by mouth daily at 12 noon.   TRAMADOL (ULTRAM) 50 MG TABLET    Take 50 mg by mouth every 6 (six) hours as needed for moderate pain.   TURMERIC (QC TUMERIC COMPLEX PO)    Take 1 capsule by mouth daily.   VITAMIN E 180 MG (400 UNITS) CAPSULE    Take 400 Units by mouth daily.  Modified Medications   No medications on file   Discontinued Medications   No medications on file    Physical Exam:  Vitals:   05/29/22 0849  BP: 130/86  Pulse: 80  Temp: (!) 96.8 F (36 C)  SpO2: 96%  Weight: 141 lb (64 kg)  Height: 5\' 8"  (1.727 m)   Body mass index is 21.44 kg/m. Wt Readings from Last 3 Encounters:  05/29/22 141 lb (64 kg)  11/13/21 148 lb 3.2 oz (67.2 kg)  11/05/21 145 lb (65.8 kg)    Physical Exam Vitals and nursing note reviewed.  Constitutional:      Appearance: Normal appearance.  Cardiovascular:     Rate and Rhythm: Normal rate and regular rhythm.  Pulmonary:     Effort: Pulmonary effort is normal.     Breath sounds: Normal breath sounds.  Abdominal:     General: Abdomen is flat. Bowel sounds are normal.     Palpations: Abdomen is soft.  Neurological:     General: No focal deficit present.     Mental Status: He is alert and oriented to person, place, and time.  Psychiatric:        Mood and Affect: Mood normal.        Behavior: Behavior normal.     Labs reviewed: Basic Metabolic Panel: Recent Labs    11/13/21 0955  NA 141  K 4.4  CL 107  CO2 30  GLUCOSE 85  BUN 13  CREATININE 0.98  CALCIUM 10.0   Liver Function Tests: No results for input(s): "AST", "ALT", "ALKPHOS", "BILITOT", "PROT", "ALBUMIN" in the last 8760 hours. No results for input(s): "LIPASE", "AMYLASE" in the last 8760 hours. No results for input(s): "AMMONIA" in the last 8760 hours. CBC: Recent Labs    10/29/21 1145 11/13/21 0955  WBC 7.4 10.3  NEUTROABS  --  6,901  HGB 14.2 13.1*  HCT 41.9 38.3*  MCV 99.5 97.5  PLT 260 339   Lipid Panel: No results for input(s): "CHOL", "HDL", "LDLCALC", "TRIG", "CHOLHDL", "LDLDIRECT" in the last 8760 hours. TSH: No results for input(s): "TSH" in the last 8760 hours. A1C: Lab Results  Component Value Date   HGBA1C 5.3 03/07/2020     Assessment/Plan  1. Weight loss Loss of weight is concerning.  There are no symptoms to suggest a diagnosis.  Will obtain  some blood work today to see other any issues  2. Bilateral carpal tunnel syndrome Continues to have paresthesias numbness and pain in his hands and fingers.  He does have positive Phalen's test on the left today.  That is being followed by neurosurgery   03/09/2020, MD [facility] [phone]

## 2022-05-30 LAB — COMPLETE METABOLIC PANEL WITH GFR
AG Ratio: 1.7 (calc) (ref 1.0–2.5)
ALT: 11 U/L (ref 9–46)
AST: 19 U/L (ref 10–35)
Albumin: 4.5 g/dL (ref 3.6–5.1)
Alkaline phosphatase (APISO): 98 U/L (ref 35–144)
BUN: 10 mg/dL (ref 7–25)
CO2: 26 mmol/L (ref 20–32)
Calcium: 9.9 mg/dL (ref 8.6–10.3)
Chloride: 105 mmol/L (ref 98–110)
Creat: 1.13 mg/dL (ref 0.70–1.35)
Globulin: 2.6 g/dL (calc) (ref 1.9–3.7)
Glucose, Bld: 82 mg/dL (ref 65–139)
Potassium: 4.6 mmol/L (ref 3.5–5.3)
Sodium: 140 mmol/L (ref 135–146)
Total Bilirubin: 0.5 mg/dL (ref 0.2–1.2)
Total Protein: 7.1 g/dL (ref 6.1–8.1)
eGFR: 73 mL/min/{1.73_m2} (ref >=60)

## 2022-06-17 ENCOUNTER — Ambulatory Visit (INDEPENDENT_AMBULATORY_CARE_PROVIDER_SITE_OTHER): Payer: Medicaid Other | Admitting: Orthopedic Surgery

## 2022-06-17 ENCOUNTER — Ambulatory Visit (INDEPENDENT_AMBULATORY_CARE_PROVIDER_SITE_OTHER): Payer: Medicaid Other

## 2022-06-17 ENCOUNTER — Encounter: Payer: Self-pay | Admitting: Orthopedic Surgery

## 2022-06-17 DIAGNOSIS — M79671 Pain in right foot: Secondary | ICD-10-CM

## 2022-06-17 DIAGNOSIS — M205X1 Other deformities of toe(s) (acquired), right foot: Secondary | ICD-10-CM | POA: Diagnosis not present

## 2022-06-17 DIAGNOSIS — M21611 Bunion of right foot: Secondary | ICD-10-CM | POA: Diagnosis not present

## 2022-06-17 NOTE — Progress Notes (Signed)
Office Visit Note   Patient: Mark Hampton           Date of Birth: 02-27-1961           MRN: [id number] Visit Date: 06/17/2022              Requested by: Frederica Kuster, MD [address] [place],  [place] [address] PCP: Frederica Kuster, MD  Chief Complaint  Patient presents with   Right Foot - Pain      HPI: Patient is a 61 year old gentleman who presents for painful bunion and claw toe deformity right foot great toe and second toe.  Patient feels like the deformity is secondary to a fracture that did not heal well.  Assessment & Plan: Visit Diagnoses:  1. Pain in right foot   2. Bunion of great toe of right foot   3. Claw toe, right     Plan: With patient's persistent pain with activities of daily living he states he would like to proceed with surgery.  Recommend proceeding with a chevron osteotomy for the first metatarsal Aiken osteotomy for the proximal phalanx great toe and a Weil osteotomy for the second metatarsal.  Risks and benefits were discussed including infection neurovascular injury nonhealing of the bone need for additional surgery.  Discussed the increased risks with the patient's history of tobacco use.  Discussed the importance of complete tobacco cessation at this time and continue until the wounds have healed.  Patient states she understands wished to proceed at this time.  Follow-Up Instructions: Return in about 1 week (around 06/24/2022).   Ortho Exam  Patient is alert, oriented, no adenopathy, well-dressed, normal affect, normal respiratory effort. Examination patient has a strong dorsalis pedis pulse the great toe is overlapping the second toe there is no open ulcers.  Patient does have some pain to palpation over the plantar fascia but there is no plantar fibromatosis.  Imaging: XR Foot 2 Views Right  Result Date: 06/17/2022 2 view radiographs of the right foot shows a moderate hallux valgus deformity of the great toe with overlapping the great toe and  second toe with a long second and third metatarsal.  No images are attached to the encounter.  Labs: Lab Results  Component Value Date   HGBA1C 5.3 03/07/2020   CRP 3 03/07/2020   REPTSTATUS 08/25/2016 FINAL 08/23/2016   CULT NO GROWTH 08/23/2016     Lab Results  Component Value Date   ALBUMIN 3.8 06/21/2020   ALBUMIN 3.3 (L) 08/23/2016    No results found for: "MG" No results found for: "VD25OH"  No results found for: "PREALBUMIN"    Latest Ref Rng & Units 11/13/2021    9:55 AM 10/29/2021   11:45 AM 06/21/2020   12:22 PM  CBC EXTENDED  WBC 3.8 - 10.8 Thousand/uL 10.3  7.4  6.6   RBC 4.20 - 5.80 Million/uL 3.93  4.21  4.20   Hemoglobin 13.2 - 17.1 g/dL 83.1  51.7  61.6   HCT 38.5 - 50.0 % 38.3  41.9  42.2   Platelets 140 - 400 Thousand/uL 339  260  230   NEUT# 1,500 - 7,800 cells/uL 6,901     Lymph# 850 - 3,900 cells/uL 2,338        There is no height or weight on file to calculate BMI.  Orders:  Orders Placed This Encounter  Procedures   XR Foot 2 Views Right   No orders of the defined types were placed in  this encounter.    Procedures: No procedures performed  Clinical Data: No additional findings.  ROS:  All other systems negative, except as noted in the HPI. Review of Systems  Objective: Vital Signs: There were no vitals taken for this visit.  Specialty Comments:  No specialty comments available.  PMFS History: Patient Active Problem List   Diagnosis Date Noted   Ulnar neuropathy 11/13/2021   Spinal stenosis of cervical region 11/05/2021   Shoulder joint pain 05/01/2021   Paresthesia of skin 02/20/2021   Numbness and tingling in right hand 02/20/2021   Neck pain 02/20/2021   Elevated blood-pressure reading, without diagnosis of hypertension 09/19/2020   Myelopathy (HCC) 06/28/2020   Bilateral carpal tunnel syndrome 03/29/2020   Dental abscess 08/24/2016   Neck abscess 08/24/2016   Past Medical History:  Diagnosis Date   Arthritis     hands   Bilateral carpal tunnel syndrome 03/29/2020    Family History  Problem Relation Age of Onset   Colon cancer Neg Hx    Colon polyps Neg Hx    Esophageal cancer Neg Hx    Rectal cancer Neg Hx    Stomach cancer Neg Hx     Past Surgical History:  Procedure Laterality Date   ABCESS DRAINAGE     in the jaw area   ANTERIOR CERVICAL DECOMP/DISCECTOMY FUSION N/A 06/28/2020   Procedure: Anterior Cervical Decompression Fusion - Cervical three-four;  Surgeon: Donalee Citrin, MD;  Location: [facility] OR;  Service: Neurosurgery;  Laterality: N/A;   ANTERIOR CERVICAL DECOMP/DISCECTOMY FUSION N/A 11/05/2021   Procedure: Anterior Cervical Discectomy and Fusion Cervical Four-Five/Cervical Five-Six/Cervical Six-Seven with Hardware Removal Cervical Three-Four;  Surgeon: Donalee Citrin, MD;  Location: [facility] OR;  Service: Neurosurgery;  Laterality: N/A;   SPINE SURGERY  2021   Social History   Occupational History   Not on file  Tobacco Use   Smoking status: Every Day    Packs/day: 0.50    Years: 25.00    Total pack years: 12.50    Types: Cigarettes   Smokeless tobacco: Never  Vaping Use   Vaping Use: Never used  Substance and Sexual Activity   Alcohol use: Yes    Alcohol/week: 4.0 standard drinks of alcohol    Types: 4 Cans of beer per week   Drug use: Never   Sexual activity: Not on file

## 2022-09-04 ENCOUNTER — Telehealth: Payer: Self-pay

## 2022-09-04 NOTE — Telephone Encounter (Signed)
Patient called and stated that he had surgery scheduled for later this week but he cancelled it. Patient states that his surgeon highly recommended that he stopped smoking before procedure. Patient wanted to know if you could send something into the pharmacy for him to take and begin his journey to quit smoking. Patient uses [facility] located on [facility], phone number [phone]. Message routed to PCP Hyacinth Meeker Bertram Millard, MD

## 2022-09-06 ENCOUNTER — Ambulatory Visit (HOSPITAL_COMMUNITY): Admission: RE | Admit: 2022-09-06 | Payer: Medicaid Other | Source: Ambulatory Visit | Admitting: Orthopedic Surgery

## 2022-09-06 ENCOUNTER — Encounter (HOSPITAL_COMMUNITY): Admission: RE | Payer: Self-pay | Source: Ambulatory Visit

## 2022-09-06 SURGERY — OSTEOTOMY, WEIL
Anesthesia: Choice | Laterality: Right

## 2022-09-09 NOTE — Telephone Encounter (Signed)
There are nicotine patches and gum and lozenges. These can help but they do not do it for you; it really is up to you.. There is also a prescription, bupropion if you are interested in that

## 2022-09-16 ENCOUNTER — Ambulatory Visit (INDEPENDENT_AMBULATORY_CARE_PROVIDER_SITE_OTHER): Payer: Medicaid Other | Admitting: Orthopedic Surgery

## 2022-09-16 DIAGNOSIS — M205X1 Other deformities of toe(s) (acquired), right foot: Secondary | ICD-10-CM | POA: Diagnosis not present

## 2022-09-16 DIAGNOSIS — M21611 Bunion of right foot: Secondary | ICD-10-CM

## 2022-09-17 ENCOUNTER — Encounter: Payer: Self-pay | Admitting: Orthopedic Surgery

## 2022-09-17 NOTE — Progress Notes (Signed)
Office Visit Note   Patient: Mark Hampton           Date of Birth: [birth year]           MRN: [id number] Visit Date: 09/16/2022              Requested by: Frederica Kuster, MD [address] [place],  [place] [address] PCP: Frederica Kuster, MD  Chief Complaint  Patient presents with   Right Foot - Follow-up      HPI: Patient is a 61 year old gentleman who is seen in follow-up for claw toe and bunion deformity right foot.  Patient states he is still trying to quit smoking.  Assessment & Plan: Visit Diagnoses:  1. Bunion of great toe of right foot   2. Claw toe, right     Plan: Plan: With patient's persistent pain with activities of daily living he states he would like to proceed with surgery.  Recommend proceeding with a chevron osteotomy for the first metatarsal Aiken osteotomy for the proximal phalanx great toe and a Weil osteotomy for the second metatarsal.  Risks and benefits were discussed including infection neurovascular injury nonhealing of the bone need for additional surgery.  Discussed the increased risks with the patient's history of tobacco use.  Discussed the importance of complete tobacco cessation at this time and continue until the wounds have healed.  Patient states he understands wished to proceed at this time.   Follow-Up Instructions: Return in about 2 weeks (around 09/30/2022).   Ortho Exam  Patient is alert, oriented, no adenopathy, well-dressed, normal affect, normal respiratory effort. Patient has a palpable dorsalis pedis pulse he has bunion deformity of the great toe with a noncongruent MTP joint with a long second metatarsal and flexible clawing of the second toe.    Patient's intermetatarsal angle is 18 degrees with a hallux valgus angle of 35 degrees.   Imaging: No results found. No images are attached to the encounter.  Labs: Lab Results  Component Value Date   HGBA1C 5.3 03/07/2020   CRP 3 03/07/2020   REPTSTATUS 08/25/2016 FINAL  08/23/2016   CULT NO GROWTH 08/23/2016     Lab Results  Component Value Date   ALBUMIN 3.8 06/21/2020   ALBUMIN 3.3 (L) 08/23/2016    No results found for: "MG" No results found for: "VD25OH"  No results found for: "PREALBUMIN"    Latest Ref Rng & Units 11/13/2021    9:55 AM 10/29/2021   11:45 AM 06/21/2020   12:22 PM  CBC EXTENDED  WBC 3.8 - 10.8 Thousand/uL 10.3  7.4  6.6   RBC 4.20 - 5.80 Million/uL 3.93  4.21  4.20   Hemoglobin 13.2 - 17.1 g/dL 17.4  08.1  44.8   HCT 38.5 - 50.0 % 38.3  41.9  42.2   Platelets 140 - 400 Thousand/uL 339  260  230   NEUT# 1,500 - 7,800 cells/uL 6,901     Lymph# 850 - 3,900 cells/uL 2,338        There is no height or weight on file to calculate BMI.  Orders:  No orders of the defined types were placed in this encounter.  No orders of the defined types were placed in this encounter.    Procedures: No procedures performed  Clinical Data: No additional findings.  ROS:  All other systems negative, except as noted in the HPI. Review of Systems  Objective: Vital Signs: There were no vitals taken for this visit.  Specialty  Comments:  No specialty comments available.  PMFS History: Patient Active Problem List   Diagnosis Date Noted   Ulnar neuropathy 11/13/2021   Spinal stenosis of cervical region 11/05/2021   Shoulder joint pain 05/01/2021   Paresthesia of skin 02/20/2021   Numbness and tingling in right hand 02/20/2021   Neck pain 02/20/2021   Elevated blood-pressure reading, without diagnosis of hypertension 09/19/2020   Myelopathy (HCC) 06/28/2020   Bilateral carpal tunnel syndrome 03/29/2020   Dental abscess 08/24/2016   Neck abscess 08/24/2016   Past Medical History:  Diagnosis Date   Arthritis    hands   Bilateral carpal tunnel syndrome 03/29/2020    Family History  Problem Relation Age of Onset   Colon cancer Neg Hx    Colon polyps Neg Hx    Esophageal cancer Neg Hx    Rectal cancer Neg Hx    Stomach cancer  Neg Hx     Past Surgical History:  Procedure Laterality Date   ABCESS DRAINAGE     in the jaw area   ANTERIOR CERVICAL DECOMP/DISCECTOMY FUSION N/A 06/28/2020   Procedure: Anterior Cervical Decompression Fusion - Cervical three-four;  Surgeon: Donalee Citrin, MD;  Location: [facility] OR;  Service: Neurosurgery;  Laterality: N/A;   ANTERIOR CERVICAL DECOMP/DISCECTOMY FUSION N/A 11/05/2021   Procedure: Anterior Cervical Discectomy and Fusion Cervical Four-Five/Cervical Five-Six/Cervical Six-Seven with Hardware Removal Cervical Three-Four;  Surgeon: Donalee Citrin, MD;  Location: [facility] OR;  Service: Neurosurgery;  Laterality: N/A;   SPINE SURGERY  2021   Social History   Occupational History   Not on file  Tobacco Use   Smoking status: Every Day    Packs/day: 0.50    Years: 25.00    Total pack years: 12.50    Types: Cigarettes   Smokeless tobacco: Never  Vaping Use   Vaping Use: Never used  Substance and Sexual Activity   Alcohol use: Yes    Alcohol/week: 4.0 standard drinks of alcohol    Types: 4 Cans of beer per week   Drug use: Never   Sexual activity: Not on file

## 2022-10-22 ENCOUNTER — Other Ambulatory Visit: Payer: Self-pay | Admitting: Orthopedic Surgery

## 2022-10-22 ENCOUNTER — Other Ambulatory Visit: Payer: Self-pay

## 2022-10-22 ENCOUNTER — Telehealth: Payer: Self-pay | Admitting: Orthopedic Surgery

## 2022-10-22 MED ORDER — NICOTINE POLACRILEX 4 MG MT GUM: 4.0000 mg | CHEWING_GUM | OROMUCOSAL | 0 refills | Status: DC | PRN

## 2022-10-22 MED ORDER — NICOTINE 21 MG/24HR TD PT24: 21.0000 mg | MEDICATED_PATCH | Freq: Every day | TRANSDERMAL | 0 refills | Status: AC

## 2022-10-22 MED ORDER — NICOTINE 21 MG/24HR TD PT24: 21.0000 mg | MEDICATED_PATCH | Freq: Every day | TRANSDERMAL | 0 refills | Status: DC

## 2022-10-22 NOTE — Telephone Encounter (Signed)
Resent rx to [facility] listed in chart

## 2022-10-22 NOTE — Telephone Encounter (Signed)
Patient requesting a Rx for Nicotine, patches and gum..faxed to [facility]

## 2022-10-23 ENCOUNTER — Telehealth: Payer: Self-pay | Admitting: Orthopedic Surgery

## 2022-10-23 NOTE — Telephone Encounter (Signed)
Patient states he is wanting to go ahead with surgery on his right foot with Sharol Given, please call to schedule..[phone]

## 2022-11-06 NOTE — Telephone Encounter (Signed)
I called Mark Hampton.  We scheduled surgery for Friday 11/22/22 at [facility]. All surgery info given to patient. He requested a Friday because his wife will be off of work.

## 2022-11-22 ENCOUNTER — Ambulatory Visit (HOSPITAL_COMMUNITY): Admission: RE | Admit: 2022-11-22 | Payer: Medicaid Other | Source: Home / Self Care | Admitting: Orthopedic Surgery

## 2022-11-22 ENCOUNTER — Encounter (HOSPITAL_COMMUNITY): Admission: RE | Payer: Self-pay | Source: Home / Self Care

## 2022-11-22 SURGERY — OSTEOTOMY, WEIL
Anesthesia: Choice | Laterality: Right

## 2022-12-02 ENCOUNTER — Ambulatory Visit (INDEPENDENT_AMBULATORY_CARE_PROVIDER_SITE_OTHER): Payer: Medicaid Other | Admitting: Orthopedic Surgery

## 2022-12-02 ENCOUNTER — Encounter: Payer: Self-pay | Admitting: Orthopedic Surgery

## 2022-12-02 DIAGNOSIS — M21611 Bunion of right foot: Secondary | ICD-10-CM | POA: Diagnosis not present

## 2022-12-02 DIAGNOSIS — M205X1 Other deformities of toe(s) (acquired), right foot: Secondary | ICD-10-CM

## 2022-12-02 NOTE — Progress Notes (Signed)
Office Visit Note   Patient: Mark Hampton           Date of Birth: 02-18-1961           MRN: [id number] Visit Date: 12/02/2022              Requested by: Wardell Honour, MD [address] [place],  [place] [address] PCP: Wardell Honour, MD  Chief Complaint  Patient presents with   Right Foot - Follow-up      HPI: Patient presents in follow-up for painful hallux valgus deformity right foot.  Patient has been in a soft stretchable wide shoe for over 4 months without relief.  Patient complains of progressive deformity of the great toe on top of the second toe as well as plantar pain beneath the first metatarsal head.  Patient states he has used bunion pads without relief.  Assessment & Plan: Visit Diagnoses:  1. Bunion of great toe of right foot   2. 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  I have recommended proceeding with a chevron Aiken osteotomy of the MTP joint of the great toe and a Weil osteotomy of the second metatarsal and most likely a fusion of the PIP joint of the second toe.  Risks and benefits were discussed including persistent pain numbness nonhealing of the incision and recurrence of deformity need for additional surgery.  Patient states he understands wished to proceed at this time.  Follow-Up Instructions: Return if symptoms worsen or fail to improve.   Ortho Exam  Patient is alert, oriented, no adenopathy, well-dressed, normal affect, normal respiratory effort. Examination patient is a good dorsalis pedis and posterior tibial pulse.  He has a moderate to his hallux valgus deformity with a hallux valgus angle of 45 degrees and a first intermetatarsal angle of 15 degrees.  The great toe was on top of the second toe and there is a long second metatarsal with valgus deformity of the PIP joint of the second toe due to pressure from the great  toe.  Imaging: No results found. No images are attached to the encounter.  Labs: Lab Results  Component Value Date   HGBA1C 5.3 03/07/2020   CRP 3 03/07/2020   REPTSTATUS 08/25/2016 FINAL 08/23/2016   CULT NO GROWTH 08/23/2016     Lab Results  Component Value Date   ALBUMIN 3.8 06/21/2020   ALBUMIN 3.3 (L) 08/23/2016    No results found for: "MG" No results found for: "VD25OH"  No results found for: "PREALBUMIN"    Latest Ref Rng & Units 11/13/2021    9:55 AM 10/29/2021   11:45 AM 06/21/2020   12:22 PM  CBC EXTENDED  WBC 3.8 - 10.8 Thousand/uL 10.3  7.4  6.6   RBC 4.20 - 5.80 Million/uL 3.93  4.21  4.20   Hemoglobin 13.2 - 17.1 g/dL 13.1  14.2  14.4   HCT 38.5 - 50.0 % 38.3  41.9  42.2   Platelets 140 - 400 Thousand/uL 339  260  230   NEUT# 1,500 - 7,800 cells/uL 6,901     Lymph# 850 - 3,900 cells/uL 2,338        There is no height or weight on file to calculate BMI.  Orders:  No orders of the defined types were placed in this encounter.  No orders of the defined types were placed in this encounter.  Procedures: No procedures performed  Clinical Data: No additional findings.  ROS:  All other systems negative, except as noted in the HPI. Review of Systems  Objective: Vital Signs: There were no vitals taken for this visit.  Specialty Comments:  No specialty comments available.  PMFS History: Patient Active Problem List   Diagnosis Date Noted   Ulnar neuropathy 11/13/2021   Spinal stenosis of cervical region 11/05/2021   Shoulder joint pain 05/01/2021   Paresthesia of skin 02/20/2021   Numbness and tingling in right hand 02/20/2021   Neck pain 02/20/2021   Elevated blood-pressure reading, without diagnosis of hypertension 09/19/2020   Myelopathy ([place]) 06/28/2020   Bilateral carpal tunnel syndrome 03/29/2020   Dental abscess 08/24/2016   Neck abscess 08/24/2016   Past Medical History:  Diagnosis Date   Arthritis    hands   Bilateral  carpal tunnel syndrome 03/29/2020    Family History  Problem Relation Age of Onset   Colon cancer Neg Hx    Colon polyps Neg Hx    Esophageal cancer Neg Hx    Rectal cancer Neg Hx    Stomach cancer Neg Hx     Past Surgical History:  Procedure Laterality Date   ABCESS DRAINAGE     in the jaw area   ANTERIOR CERVICAL DECOMP/DISCECTOMY FUSION N/A 06/28/2020   Procedure: Anterior Cervical Decompression Fusion - Cervical three-four;  Surgeon: Kary Kos, MD;  Location: [facility];  Service: Neurosurgery;  Laterality: N/A;   ANTERIOR CERVICAL DECOMP/DISCECTOMY FUSION N/A 11/05/2021   Procedure: Anterior Cervical Discectomy and Fusion Cervical Four-Five/Cervical Five-Six/Cervical Six-Seven with Hardware Removal Cervical Three-Four;  Surgeon: Kary Kos, MD;  Location: [facility];  Service: Neurosurgery;  Laterality: N/A;   [place]  2021   Social History   Occupational History   Not on file  Tobacco Use   Smoking status: Every Day    Packs/day: 0.50    Years: 25.00    Total pack years: 12.50    Types: Cigarettes   Smokeless tobacco: Never  Vaping Use   Vaping Use: Never used  Substance and Sexual Activity   Alcohol use: Yes    Alcohol/week: 4.0 standard drinks of alcohol    Types: 4 Cans of beer per week   Drug use: Never   Sexual activity: Not on file

## 2022-12-03 ENCOUNTER — Ambulatory Visit: Payer: Medicaid Other | Admitting: Family Medicine

## 2022-12-04 ENCOUNTER — Ambulatory Visit: Payer: Medicaid Other | Admitting: Family Medicine

## 2022-12-06 ENCOUNTER — Ambulatory Visit (INDEPENDENT_AMBULATORY_CARE_PROVIDER_SITE_OTHER): Payer: Medicaid Other | Admitting: Nurse Practitioner

## 2022-12-06 ENCOUNTER — Encounter: Payer: Self-pay | Admitting: Nurse Practitioner

## 2022-12-06 VITALS — BP 128/84 | HR 72 | Temp 97.1°F

## 2022-12-06 DIAGNOSIS — G5603 Carpal tunnel syndrome, bilateral upper limbs: Secondary | ICD-10-CM

## 2022-12-06 DIAGNOSIS — M25511 Pain in right shoulder: Secondary | ICD-10-CM

## 2022-12-06 DIAGNOSIS — Z13220 Encounter for screening for lipoid disorders: Secondary | ICD-10-CM

## 2022-12-06 DIAGNOSIS — M159 Polyosteoarthritis, unspecified: Secondary | ICD-10-CM | POA: Diagnosis not present

## 2022-12-06 DIAGNOSIS — Z1211 Encounter for screening for malignant neoplasm of colon: Secondary | ICD-10-CM

## 2022-12-06 DIAGNOSIS — G8929 Other chronic pain: Secondary | ICD-10-CM

## 2022-12-06 DIAGNOSIS — Z23 Encounter for immunization: Secondary | ICD-10-CM | POA: Diagnosis not present

## 2022-12-06 DIAGNOSIS — M150 Primary generalized (osteo)arthritis: Secondary | ICD-10-CM

## 2022-12-06 DIAGNOSIS — Z1159 Encounter for screening for other viral diseases: Secondary | ICD-10-CM | POA: Diagnosis not present

## 2022-12-06 DIAGNOSIS — Z114 Encounter for screening for human immunodeficiency virus [HIV]: Secondary | ICD-10-CM

## 2022-12-06 MED ORDER — WRIST BRACE MISC: 2.0000 | Freq: Every day | 0 refills | Status: DC

## 2022-12-06 MED ORDER — MELOXICAM 7.5 MG PO TABS: 7.5000 mg | ORAL_TABLET | Freq: Every day | ORAL | 1 refills | Status: DC | PRN

## 2022-12-06 NOTE — Progress Notes (Unsigned)
Careteam: Patient Care Team: Wardell Honour, MD as PCP - General (Family Medicine)  PLACE OF SERVICE:  [facility] Directive information Does Patient Have a Medical Advance Directive?: No, Would patient like information on creating a medical advance directive?: No - Patient declined  No Known Allergies  Chief Complaint  Patient presents with   Medical Management of Chronic Issues    Routine visit. Discuss eed for hiv screening, hep c screening, colonoscopy, shingrix, and covid boosters. Patient c/o arthritis pain which is disturbing sleep pattern. Patient denies receiving any vaccines since last visit.       HPI: Patient is a 62 y.o. male for routine follow up.  Reports he has arthritis in both shoulders and hands, wrist.  He had carpel tunnel surgery in the left wrist and now needs right done. He still is having pain in the left.  Seeing Dr Veronia Beets for orthopedic.  Dr Loney Loh did not give him medication for pain.  He is taking OTC tylenol but has not helped the pain.  Pain has been there for months but progressively getting worse.  No injury.  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   Smoking 5 cigarettes daily.   He had stopped smoking for 1 month then restarted when he was denied surgery on his foot.   States he was seen for colonscopy, reports he was told he was scheduled for a Saturday but after he took prep and showed up it was closed.    Plans to get COVID booster at pharmacy.   Review of Systems:  Review of Systems  Constitutional:  Negative for chills, fever and weight loss.  HENT:  Negative for tinnitus.   Respiratory:  Negative for cough, sputum production and shortness of breath.   Cardiovascular:  Negative for chest pain, palpitations and leg swelling.  Gastrointestinal:  Negative for abdominal pain, constipation, diarrhea and heartburn.  Genitourinary:  Negative for dysuria, frequency and urgency.  Musculoskeletal:  Positive for back  pain, joint pain and myalgias. Negative for falls.  Skin: Negative.   Neurological:  Positive for tingling and sensory change. Negative for dizziness and headaches.  Psychiatric/Behavioral:  Negative for depression and memory loss. The patient does not have insomnia.     Past Medical History:  Diagnosis Date   Arthritis    hands   Bilateral carpal tunnel syndrome 03/29/2020   Past Surgical History:  Procedure Laterality Date   ABCESS DRAINAGE     in the jaw area   ANTERIOR CERVICAL DECOMP/DISCECTOMY FUSION N/A 06/28/2020   Procedure: Anterior Cervical Decompression Fusion - Cervical three-four;  Surgeon: Kary Kos, MD;  Location: [facility];  Service: Neurosurgery;  Laterality: N/A;   ANTERIOR CERVICAL DECOMP/DISCECTOMY FUSION N/A 11/05/2021   Procedure: Anterior Cervical Discectomy and Fusion Cervical Four-Five/Cervical Five-Six/Cervical Six-Seven with Hardware Removal Cervical Three-Four;  Surgeon: Kary Kos, MD;  Location: [facility];  Service: Neurosurgery;  Laterality: N/A;   SPINE SURGERY  2021   Social History:   reports that he has been smoking cigarettes. He has a 6.25 pack-year smoking history. He has never used smokeless tobacco. He reports current alcohol use of about 4.0 standard drinks of alcohol per week. He reports that he does not use drugs.  Family History  Problem Relation Age of Onset   Colon cancer Neg Hx    Colon polyps Neg Hx    Esophageal cancer Neg Hx    Rectal cancer Neg Hx    Stomach cancer Neg  Hx     Medications: Patient's Medications  New Prescriptions   No medications on file  Previous Medications   ACETAMINOPHEN (TYLENOL) 500 MG TABLET    Take 1,000 mg by mouth every 6 (six) hours as needed for moderate pain.   CHOLECALCIFEROL (VITAMIN D3) 10 MCG (400 UNIT) TABLET    Take 400 Units by mouth daily.   MULTIPLE VITAMIN (MULTIVITAMIN) CAPSULE    Take 1 capsule by mouth daily.   NICOTINE (NICODERM CQ) 21 MG/24HR PATCH    Place 1 patch (21 mg total) onto the  skin daily.   NICOTINE POLACRILEX (NICORETTE) 4 MG GUM    Take 1 each (4 mg total) by mouth as needed for smoking cessation.   VITAMIN E 180 MG (400 UNITS) CAPSULE    Take 400 Units by mouth daily.  Modified Medications   No medications on file  Discontinued Medications   CYCLOBENZAPRINE (FLEXERIL) 10 MG TABLET    Take 1 tablet (10 mg total) by mouth 3 (three) times daily as needed for muscle spasms.   NA SULFATE-K SULFATE-MG SULF 17.5-3.13-1.6 GM/177ML SOLN    Suprep (no substitutions)-TAKE AS DIRECTED.    Physical Exam:  Vitals:   12/06/22 0934 12/06/22 0935  BP: (!) 132/90 128/84  Pulse: 72   Temp: (!) 97.1 F (36.2 C)   TempSrc: Temporal   SpO2: 99%    There is no height or weight on file to calculate BMI. Wt Readings from Last 3 Encounters:  05/29/22 141 lb (64 kg)  11/13/21 148 lb 3.2 oz (67.2 kg)  11/05/21 145 lb (65.8 kg)    Physical Exam Constitutional:      General: He is not in acute distress.    Appearance: He is well-developed. He is not diaphoretic.  HENT:     Head: Normocephalic and atraumatic.     Right Ear: External ear normal.     Left Ear: External ear normal.     Mouth/Throat:     Pharynx: No oropharyngeal exudate.  Eyes:     Conjunctiva/sclera: Conjunctivae normal.     Pupils: Pupils are equal, round, and reactive to light.  Cardiovascular:     Rate and Rhythm: Normal rate and regular rhythm.     Heart sounds: Normal heart sounds.  Pulmonary:     Effort: Pulmonary effort is normal.     Breath sounds: Normal breath sounds.  Abdominal:     General: Bowel sounds are normal.     Palpations: Abdomen is soft.  Musculoskeletal:     Right shoulder: Tenderness present. Decreased range of motion.     Left shoulder: Tenderness present. Decreased range of motion.     Right wrist: Swelling and tenderness present. Decreased range of motion.     Left wrist: Swelling and tenderness present. Decreased range of motion.     Cervical back: Normal range of  motion and neck supple.     Right lower leg: No edema.     Left lower leg: No edema.  Skin:    General: Skin is warm and dry.  Neurological:     Mental Status: He is alert and oriented to person, place, and time.     Labs reviewed: Basic Metabolic Panel: Recent Labs    05/29/22 0921  NA 140  K 4.6  CL 105  CO2 26  GLUCOSE 82  BUN 10  CREATININE 1.13  CALCIUM 9.9   Liver Function Tests: Recent Labs    05/29/22 0921  AST 19  ALT 11  BILITOT 0.5  PROT 7.1   No results for input(s): "LIPASE", "AMYLASE" in the last 8760 hours. No results for input(s): "AMMONIA" in the last 8760 hours. CBC: No results for input(s): "WBC", "NEUTROABS", "HGB", "HCT", "MCV", "PLT" in the last 8760 hours. Lipid Panel: No results for input(s): "CHOL", "HDL", "LDLCALC", "TRIG", "CHOLHDL", "LDLDIRECT" in the last 8760 hours. TSH: No results for input(s): "TSH" in the last 8760 hours. A1C: Lab Results  Component Value Date   HGBA1C 5.3 03/07/2020     Assessment/Plan 1. Need for influenza vaccination - Flu Vaccine QUAD 6+ mos PF IM (Fluarix Quad PF)  2. Screening for cholesterol level - Lipid panel  3. Need for hepatitis C screening test - Hepatitis C antibody  4. Encounter for screening for HIV - HIV antibody (with reflex)  5. Bilateral carpal tunnel syndrome Ongoing, continues to follow up with orthopedics - meloxicam (MOBIC) 7.5 MG tablet; Take 1 tablet (7.5 mg total) by mouth daily as needed for pain.  Dispense: 30 tablet; Refill: 1 - Misc. Devices (WRIST BRACE) MISC; 2 Devices by Does not apply route daily. Right and left arm  Dispense: 2 each; Refill: 0  6. Primary osteoarthritis involving multiple joints - continues tylenol PRN but not helping control pain- will add mobic PRN -CBC with Differential/Platelet - Complete Metabolic Panel with eGFR - meloxicam (MOBIC) 7.5 MG tablet; Take 1 tablet (7.5 mg total) by mouth daily as needed for pain.  Dispense: 30 tablet; Refill:  1  7. Encounter for screening colonoscopy - Ambulatory referral to Gastroenterology  8. Chronic right shoulder pain - Ambulatory referral to Physical Therapy - Ambulatory referral to Sports Medicine - meloxicam (MOBIC) 7.5 MG tablet; Take 1 tablet (7.5 mg total) by mouth daily as needed for pain.  Dispense: 30 tablet; Refill: 1    Return in about 6 months (around 06/06/2023) for routine follow up .  Carlos American. [place], [facility] [phone]

## 2022-12-09 LAB — CBC WITH DIFFERENTIAL/PLATELET
Absolute Monocytes: 670 cells/uL (ref 200–950)
Basophils Absolute: 33 cells/uL (ref 0–200)
Basophils Relative: 0.5 %
Eosinophils Absolute: 280 cells/uL (ref 15–500)
Eosinophils Relative: 4.3 %
HCT: 39.6 % (ref 38.5–50.0)
Hemoglobin: 13.6 g/dL (ref 13.2–17.1)
Lymphs Abs: 2464 cells/uL (ref 850–3900)
MCH: 32.7 pg (ref 27.0–33.0)
MCHC: 34.3 g/dL (ref 32.0–36.0)
MCV: 95.2 fL (ref 80.0–100.0)
MPV: 9.8 fL (ref 7.5–12.5)
Monocytes Relative: 10.3 %
Neutro Abs: 3055 cells/uL (ref 1500–7800)
Neutrophils Relative %: 47 %
Platelets: 242 10*3/uL (ref 140–400)
RBC: 4.16 10*6/uL — ABNORMAL LOW (ref 4.20–5.80)
RDW: 12.4 % (ref 11.0–15.0)
Total Lymphocyte: 37.9 %
WBC: 6.5 10*3/uL (ref 3.8–10.8)

## 2022-12-09 LAB — COMPLETE METABOLIC PANEL WITH GFR
AG Ratio: 1.6 (calc) (ref 1.0–2.5)
ALT: 11 U/L (ref 9–46)
AST: 16 U/L (ref 10–35)
Albumin: 4.2 g/dL (ref 3.6–5.1)
Alkaline phosphatase (APISO): 101 U/L (ref 35–144)
BUN: 14 mg/dL (ref 7–25)
CO2: 23 mmol/L (ref 20–32)
Calcium: 9.4 mg/dL (ref 8.6–10.3)
Chloride: 107 mmol/L (ref 98–110)
Creat: 1.25 mg/dL (ref 0.70–1.35)
Globulin: 2.6 g/dL (calc) (ref 1.9–3.7)
Glucose, Bld: 57 mg/dL — ABNORMAL LOW (ref 65–139)
Potassium: 4.4 mmol/L (ref 3.5–5.3)
Sodium: 140 mmol/L (ref 135–146)
Total Bilirubin: 0.3 mg/dL (ref 0.2–1.2)
Total Protein: 6.8 g/dL (ref 6.1–8.1)
eGFR: 65 mL/min/{1.73_m2} (ref >=60)

## 2022-12-09 LAB — LIPID PANEL
Cholesterol: 174 mg/dL (ref <200)
HDL: 74 mg/dL (ref >=40)
LDL Cholesterol (Calc): 79 mg/dL (calc)
Non-HDL Cholesterol (Calc): 100 mg/dL (calc) (ref <130)
Total CHOL/HDL Ratio: 2.4 (calc) (ref <5.0)
Triglycerides: 116 mg/dL (ref <150)

## 2022-12-09 LAB — HIV ANTIBODY (ROUTINE TESTING W REFLEX): HIV 1&2 Ab, 4th Generation: NONREACTIVE

## 2022-12-09 LAB — HEPATITIS C ANTIBODY: Hepatitis C Ab: NONREACTIVE

## 2022-12-13 ENCOUNTER — Ambulatory Visit: Payer: Medicaid Other | Admitting: Family Medicine

## 2023-01-02 NOTE — Pre-Procedure Instructions (Signed)
Surgical Instructions    Your procedure is scheduled on January 07, 2023.  Report to [facility] Main Entrance "A" at 5:30 A.M., then check in with the Admitting office.  Call this number if you have problems the morning of surgery:  [phone]  If you have any questions prior to your surgery date call [phone]: Open Monday-Friday 8am-4pm If you experience any cold or flu symptoms such as cough, fever, chills, shortness of breath, etc. between now and your scheduled surgery, please notify us at the above number.     Remember:  Do not eat after midnight the night before your surgery  You may drink clear liquids until 4:30 AM the morning of your surgery.   Clear liquids allowed are: Water, Non-Citrus Juices (without pulp), Carbonated Beverages, Clear Tea, Black Coffee Only (NO MILK, CREAM OR POWDERED CREAMER of any kind), and Gatorade.  Patient Instructions  The night before surgery:  No food after midnight. ONLY clear liquids after midnight  The day of surgery (if you do NOT have diabetes):  Drink ONE (1) Pre-Surgery Clear Ensure by 4:30 AM the morning of surgery. Drink in one sitting. Do not sip.  This drink was given to you during your hospital  pre-op appointment visit.  Nothing else to drink after completing the  Pre-Surgery Clear Ensure.         If you have questions, please contact your surgeon's office.     Take these medicines the morning of surgery with A SIP OF WATER:  acetaminophen (TYLENOL) - may take if needed  nicotine polacrilex (NICORETTE) - may take if needed     As of today, STOP taking any Aspirin (unless otherwise instructed by your surgeon) Aleve, Naproxen, Ibuprofen, Motrin, Advil, Goody's, BC's, all herbal medications, fish oil, and all vitamins. This includes your medication: meloxicam (MOBIC)                      Do NOT Smoke (Tobacco/Vaping) for 24 hours prior to your procedure.  If you use a CPAP at night, you may bring your mask/headgear for  your overnight stay.   Contacts, glasses, piercing's, hearing aid's, dentures or partials may not be worn into surgery, please bring cases for these belongings.    For patients admitted to the hospital, discharge time will be determined by your treatment team.   Patients discharged the day of surgery will not be allowed to drive home, and someone needs to stay with them for 24 hours.  SURGICAL WAITING ROOM VISITATION Patients having surgery or a procedure may have no more than 2 support people in the waiting area - these visitors may rotate.   Children under the age of 73 must have an adult with them who is not the patient. If the patient needs to stay at the hospital during part of their recovery, the visitor guidelines for inpatient rooms apply. Pre-op nurse will coordinate an appropriate time for 1 support person to accompany patient in pre-op.  This support person may not rotate.   Please refer to the [facility] website for the visitor guidelines for Inpatients (after your surgery is over and you are in a regular room).    Special instructions:   [facility]- Preparing For Surgery  Before surgery, you can play an important role. Because skin is not sterile, your skin needs to be as free of germs as possible. You can reduce the number of germs on your skin by washing with CHG (chlorahexidine gluconate) Soap  before surgery.  CHG is an antiseptic cleaner which kills germs and bonds with the skin to continue killing germs even after washing.    Oral Hygiene is also important to reduce your risk of infection.  Remember - BRUSH YOUR TEETH THE MORNING OF SURGERY WITH YOUR REGULAR TOOTHPASTE  Please do not use if you have an allergy to CHG or antibacterial soaps. If your skin becomes reddened/irritated stop using the CHG.  Do not shave (including legs and underarms) for at least 48 hours prior to first CHG shower. It is OK to shave your face.  Please follow these instructions  carefully.   Shower the NIGHT BEFORE SURGERY and the MORNING OF SURGERY  If you chose to wash your hair, wash your hair first as usual with your normal shampoo.  After you shampoo, rinse your hair and body thoroughly to remove the shampoo.  Use CHG Soap as you would any other liquid soap. You can apply CHG directly to the skin and wash gently with a scrungie or a clean washcloth.   Apply the CHG Soap to your body ONLY FROM THE NECK DOWN.  Do not use on open wounds or open sores. Avoid contact with your eyes, ears, mouth and genitals (private parts). Wash Face and genitals (private parts)  with your normal soap.   Wash thoroughly, paying special attention to the area where your surgery will be performed.  Thoroughly rinse your body with warm water from the neck down.  DO NOT shower/wash with your normal soap after using and rinsing off the CHG Soap.  Pat yourself dry with a CLEAN TOWEL.  Wear CLEAN PAJAMAS to bed the night before surgery  Place CLEAN SHEETS on your bed the night before your surgery  DO NOT SLEEP WITH PETS.   Day of Surgery: Take a shower with CHG soap. Do not wear jewelry or makeup Do not wear lotions, powders, perfumes/colognes, or deodorant. Do not shave 48 hours prior to surgery.  Men may shave face and neck. Do not bring valuables to the hospital.  [facility] is not responsible for any belongings or valuables. Do not wear nail polish, gel polish, artificial nails, or any other type of covering on natural nails (fingers and toes) If you have artificial nails or gel coating that need to be removed by a nail salon, please have this removed prior to surgery. Artificial nails or gel coating may interfere with anesthesia's ability to adequately monitor your vital signs.  Wear Clean/Comfortable clothing the morning of surgery Remember to brush your teeth WITH YOUR REGULAR TOOTHPASTE.   Please read over the following fact sheets that you were given.    If you  received a COVID test during your pre-op visit  it is requested that you wear a mask when out in public, stay away from anyone that may not be feeling well and notify your surgeon if you develop symptoms. If you have been in contact with anyone that has tested positive in the last 10 days please notify you surgeon.

## 2023-01-03 ENCOUNTER — Encounter (HOSPITAL_COMMUNITY): Payer: Self-pay

## 2023-01-03 ENCOUNTER — Encounter (HOSPITAL_COMMUNITY)
Admission: RE | Admit: 2023-01-03 | Discharge: 2023-01-03 | Disposition: A | Discharge status: Home / Self Care | Payer: Medicaid Other | Source: Ambulatory Visit | Attending: Orthopedic Surgery | Admitting: Orthopedic Surgery

## 2023-01-03 ENCOUNTER — Other Ambulatory Visit: Payer: Self-pay

## 2023-01-03 VITALS — BP 136/84 | HR 82 | Temp 98.3°F | Resp 18 | Ht 68.0 in | Wt 145.3 lb

## 2023-01-03 DIAGNOSIS — Z01812 Encounter for preprocedural laboratory examination: Secondary | ICD-10-CM | POA: Diagnosis present

## 2023-01-03 DIAGNOSIS — Z01818 Encounter for other preprocedural examination: Secondary | ICD-10-CM

## 2023-01-03 NOTE — Progress Notes (Signed)
PCP - Dr. Alain Honey Cardiologist - Denies  PPM/ICD - Denies Device Orders - n/a Rep Notified - n/a  Chest x-ray - n/a EKG - Denies Stress Test - Denies ECHO - Denies Cardiac Cath - Denies  Sleep Study - Denies CPAP - n/a  No DM  Last dose of GLP1 agonist- n/a GLP1 instructions: n/a  Blood Thinner Instructions: n/a Aspirin Instructions: n/a  ERAS Protcol - Clear liquids until 0430 morning of surgery PRE-SURGERY Ensure or G2- Ensure given to pt with instructions  COVID TEST- n/a   Anesthesia review: No.   Patient denies shortness of breath, fever, cough and chest pain at PAT appointment. Pt denies any respiratory illness/infection in the last two months.   All instructions explained to the patient, with a verbal understanding of the material. Patient agrees to go over the instructions while at home for a better understanding. Patient also instructed to self quarantine after being tested for COVID-19. The opportunity to ask questions was provided.

## 2023-01-07 ENCOUNTER — Ambulatory Visit (HOSPITAL_COMMUNITY)
Admission: RE | Admit: 2023-01-07 | Discharge: 2023-01-07 | Disposition: A | Discharge status: Home / Self Care | Payer: Medicaid Other | Attending: Orthopedic Surgery | Admitting: Orthopedic Surgery

## 2023-01-07 ENCOUNTER — Encounter (HOSPITAL_COMMUNITY): Payer: Self-pay | Admitting: Orthopedic Surgery

## 2023-01-07 ENCOUNTER — Encounter (HOSPITAL_COMMUNITY)
Admission: RE | Disposition: A | Discharge status: Home / Self Care | Payer: Self-pay | Source: Home / Self Care | Attending: Orthopedic Surgery

## 2023-01-07 ENCOUNTER — Ambulatory Visit (HOSPITAL_BASED_OUTPATIENT_CLINIC_OR_DEPARTMENT_OTHER): Payer: Medicaid Other | Admitting: Certified Registered Nurse Anesthetist

## 2023-01-07 ENCOUNTER — Other Ambulatory Visit: Payer: Self-pay

## 2023-01-07 ENCOUNTER — Ambulatory Visit (HOSPITAL_COMMUNITY): Payer: Medicaid Other | Admitting: Certified Registered Nurse Anesthetist

## 2023-01-07 ENCOUNTER — Ambulatory Visit (HOSPITAL_COMMUNITY): Payer: Medicaid Other

## 2023-01-07 DIAGNOSIS — G5602 Carpal tunnel syndrome, left upper limb: Secondary | ICD-10-CM | POA: Diagnosis not present

## 2023-01-07 DIAGNOSIS — G5621 Lesion of ulnar nerve, right upper limb: Secondary | ICD-10-CM | POA: Diagnosis not present

## 2023-01-07 DIAGNOSIS — M24031 Loose body in right wrist: Secondary | ICD-10-CM | POA: Insufficient documentation

## 2023-01-07 DIAGNOSIS — F17210 Nicotine dependence, cigarettes, uncomplicated: Secondary | ICD-10-CM | POA: Diagnosis not present

## 2023-01-07 DIAGNOSIS — G5601 Carpal tunnel syndrome, right upper limb: Secondary | ICD-10-CM | POA: Insufficient documentation

## 2023-01-07 DIAGNOSIS — M19031 Primary osteoarthritis, right wrist: Secondary | ICD-10-CM

## 2023-01-07 HISTORY — PX: ULNAR NERVE TRANSPOSITION: SHX2595

## 2023-01-07 SURGERY — ULNAR NERVE DECOMPRESSION/TRANSPOSITION
Anesthesia: Monitor Anesthesia Care | Site: Arm Lower | Laterality: Right

## 2023-01-07 MED ORDER — PROPOFOL 10 MG/ML IV BOLUS
INTRAVENOUS | Status: AC
  Filled 2023-01-07: qty 20

## 2023-01-07 MED ORDER — CHLORHEXIDINE GLUCONATE 0.12 % MT SOLN: 15.0000 mL | Freq: Once | OROMUCOSAL | Status: AC

## 2023-01-07 MED ORDER — ACETAMINOPHEN 500 MG PO TABS
ORAL_TABLET | ORAL | Status: AC
  Administered 2023-01-07: 1000 mg via ORAL
  Filled 2023-01-07: qty 2

## 2023-01-07 MED ORDER — ONDANSETRON HCL 4 MG/2ML IJ SOLN: 4.0000 mg | Freq: Once | INTRAMUSCULAR | Status: DC | PRN

## 2023-01-07 MED ORDER — FENTANYL CITRATE (PF) 250 MCG/5ML IJ SOLN
INTRAMUSCULAR | Status: AC
  Filled 2023-01-07: qty 5

## 2023-01-07 MED ORDER — ACETAMINOPHEN 500 MG PO TABS: 1000.0000 mg | ORAL_TABLET | Freq: Once | ORAL | Status: AC

## 2023-01-07 MED ORDER — CEFAZOLIN SODIUM-DEXTROSE 2-4 GM/100ML-% IV SOLN
INTRAVENOUS | Status: AC
  Filled 2023-01-07: qty 100

## 2023-01-07 MED ORDER — OXYCODONE HCL 5 MG/5ML PO SOLN: 5.0000 mg | Freq: Once | ORAL | Status: DC | PRN

## 2023-01-07 MED ORDER — CHLORHEXIDINE GLUCONATE 0.12 % MT SOLN
OROMUCOSAL | Status: AC
  Administered 2023-01-07: 15 mL via OROMUCOSAL
  Filled 2023-01-07: qty 15

## 2023-01-07 MED ORDER — LIDOCAINE 2% (20 MG/ML) 5 ML SYRINGE
INTRAMUSCULAR | Status: DC | PRN
  Administered 2023-01-07: 40 mg via INTRAVENOUS

## 2023-01-07 MED ORDER — OXYCODONE HCL 5 MG PO TABS: 5.0000 mg | ORAL_TABLET | Freq: Once | ORAL | Status: DC | PRN

## 2023-01-07 MED ORDER — FENTANYL CITRATE (PF) 250 MCG/5ML IJ SOLN
INTRAMUSCULAR | Status: DC | PRN
  Administered 2023-01-07 (×2): 50 ug via INTRAVENOUS

## 2023-01-07 MED ORDER — ORAL CARE MOUTH RINSE: 15.0000 mL | Freq: Once | OROMUCOSAL | Status: AC

## 2023-01-07 MED ORDER — PHENYLEPHRINE HCL-NACL 20-0.9 MG/250ML-% IV SOLN
INTRAVENOUS | Status: DC | PRN
  Administered 2023-01-07: 15 ug/min via INTRAVENOUS

## 2023-01-07 MED ORDER — PROPOFOL 10 MG/ML IV BOLUS
INTRAVENOUS | Status: DC | PRN
  Administered 2023-01-07: 25 mg via INTRAVENOUS
  Administered 2023-01-07: 15 mg via INTRAVENOUS
  Administered 2023-01-07: 20 mg via INTRAVENOUS

## 2023-01-07 MED ORDER — MIDAZOLAM HCL 2 MG/2ML IJ SOLN
INTRAMUSCULAR | Status: AC
  Filled 2023-01-07: qty 2

## 2023-01-07 MED ORDER — CEFAZOLIN SODIUM-DEXTROSE 2-4 GM/100ML-% IV SOLN
2.0000 g | INTRAVENOUS | Status: AC
  Administered 2023-01-07: 2 g via INTRAVENOUS

## 2023-01-07 MED ORDER — PROPOFOL 500 MG/50ML IV EMUL
INTRAVENOUS | Status: DC | PRN
  Administered 2023-01-07: 75 ug/kg/min via INTRAVENOUS

## 2023-01-07 MED ORDER — DEXAMETHASONE SODIUM PHOSPHATE 10 MG/ML IJ SOLN
INTRAMUSCULAR | Status: DC | PRN
  Administered 2023-01-07: 10 mg

## 2023-01-07 MED ORDER — AMISULPRIDE (ANTIEMETIC) 5 MG/2ML IV SOLN: 10.0000 mg | Freq: Once | INTRAVENOUS | Status: DC | PRN

## 2023-01-07 MED ORDER — HYDROMORPHONE HCL 1 MG/ML IJ SOLN: 0.2500 mg | INTRAMUSCULAR | Status: DC | PRN

## 2023-01-07 MED ORDER — 0.9 % SODIUM CHLORIDE (POUR BTL) OPTIME
TOPICAL | Status: DC | PRN
  Administered 2023-01-07: 1000 mL

## 2023-01-07 MED ORDER — ONDANSETRON HCL 4 MG/2ML IJ SOLN
INTRAMUSCULAR | Status: AC
  Filled 2023-01-07: qty 2

## 2023-01-07 MED ORDER — MIDAZOLAM HCL 2 MG/2ML IJ SOLN
INTRAMUSCULAR | Status: DC | PRN
  Administered 2023-01-07: 2 mg via INTRAVENOUS

## 2023-01-07 MED ORDER — ROPIVACAINE HCL 5 MG/ML IJ SOLN
INTRAMUSCULAR | Status: DC | PRN
  Administered 2023-01-07: 20 mL via PERINEURAL

## 2023-01-07 MED ORDER — DEXAMETHASONE SODIUM PHOSPHATE 10 MG/ML IJ SOLN
INTRAMUSCULAR | Status: AC
  Filled 2023-01-07: qty 1

## 2023-01-07 MED ORDER — LACTATED RINGERS IV SOLN: INTRAVENOUS | Status: DC

## 2023-01-07 SURGICAL SUPPLY — 52 items
BAG COUNTER SPONGE SURGICOUNT (BAG) ×1 IMPLANT
BAG SPNG CNTER NS LX DISP (BAG) ×1
BIT DRILL 1.6 90 (DRILL) IMPLANT
BNDG ELASTIC 4X5.8 VLCR STR LF (GAUZE/BANDAGES/DRESSINGS) IMPLANT
BNDG GAUZE DERMACEA FLUFF 4 (GAUZE/BANDAGES/DRESSINGS) ×2 IMPLANT
BNDG GZE DERMACEA 4 6PLY (GAUZE/BANDAGES/DRESSINGS) ×2
CORD BIPOLAR FORCEPS 12FT (ELECTRODE) ×1 IMPLANT
COVER SURGICAL LIGHT HANDLE (MISCELLANEOUS) ×1 IMPLANT
CUFF TOURN SGL QUICK 18X4 (TOURNIQUET CUFF) IMPLANT
CUP FUSION 18MM DIA HOLE X10 (Cup) IMPLANT
DRAPE OEC MINIVIEW 54X84 (DRAPES) IMPLANT
DRAPE SURG 17X23 STRL (DRAPES) ×1 IMPLANT
DRSG ADAPTIC 3X8 NADH LF (GAUZE/BANDAGES/DRESSINGS) IMPLANT
GAUZE SPONGE 4X4 12PLY STRL (GAUZE/BANDAGES/DRESSINGS) IMPLANT
GAUZE XEROFORM 5X9 LF (GAUZE/BANDAGES/DRESSINGS) IMPLANT
GLOVE BIO SURGEON STRL SZ 6.5 (GLOVE) IMPLANT
GLOVE BIOGEL M 8.0 STRL (GLOVE) ×1 IMPLANT
GLOVE SS BIOGEL STRL SZ 8 (GLOVE) ×1 IMPLANT
GOWN STRL REUS W/ TWL LRG LVL3 (GOWN DISPOSABLE) ×2 IMPLANT
GOWN STRL REUS W/ TWL XL LVL3 (GOWN DISPOSABLE) ×3 IMPLANT
GOWN STRL REUS W/TWL LRG LVL3 (GOWN DISPOSABLE) ×2
GOWN STRL REUS W/TWL XL LVL3 (GOWN DISPOSABLE) ×3
K-WIRE DBL TROCAR .045X4 (WIRE) ×2
K-WIRE FIXATION 0.8X120 (WIRE) ×1
KIT BASIN OR (CUSTOM PROCEDURE TRAY) ×1 IMPLANT
KIT TURNOVER KIT B (KITS) ×1 IMPLANT
KWIRE DBL TROCAR .045X4 (WIRE) IMPLANT
KWIRE FIXATION 0.8X120 (WIRE) IMPLANT
MANIFOLD NEPTUNE II (INSTRUMENTS) ×1 IMPLANT
NS IRRIG 1000ML POUR BTL (IV SOLUTION) ×1 IMPLANT
PACK ORTHO EXTREMITY (CUSTOM PROCEDURE TRAY) ×1 IMPLANT
PAD ARMBOARD 7.5X6 YLW CONV (MISCELLANEOUS) ×2 IMPLANT
PADDING CAST ABS COTTON 4X4 ST (CAST SUPPLIES) IMPLANT
REAMER FUSION CUP 18 (MISCELLANEOUS) IMPLANT
SCREW LOCK 14X2.4X ELB (Screw) IMPLANT
SCREW LOCK 16X2.4X ELB (Screw) IMPLANT
SCREW LOCK CORT 2.4X10 (Screw) IMPLANT
SCREW LOCK CORT 2.4X10MM (Screw) ×1 IMPLANT
SCREW LOCK CORT 2.4X12MM (Screw) IMPLANT
SCREW LOCKING 2.4X14 (Screw) ×1 IMPLANT
SCREW LOCKING 2.4X16 (Screw) ×1 IMPLANT
SOL PREP POV-IOD 4OZ 10% (MISCELLANEOUS) ×3 IMPLANT
SPIKE FLUID TRANSFER (MISCELLANEOUS) ×1 IMPLANT
SUCTION FRAZIER HANDLE 10FR (MISCELLANEOUS) ×2
SUCTION TUBE FRAZIER 10FR DISP (MISCELLANEOUS) IMPLANT
SUT PROLENE 4 0 PS 2 18 (SUTURE) IMPLANT
SUT VIC AB 2-0 CT1 27 (SUTURE) ×1
SUT VIC AB 2-0 CT1 TAPERPNT 27 (SUTURE) IMPLANT
SYR CONTROL 10ML LL (SYRINGE) IMPLANT
TOWEL GREEN STERILE (TOWEL DISPOSABLE) ×1 IMPLANT
TUBE CONNECTING 12X1/4 (SUCTIONS) IMPLANT
UNDERPAD 30X36 HEAVY ABSORB (UNDERPADS AND DIAPERS) ×1 IMPLANT

## 2023-01-07 NOTE — Anesthesia Procedure Notes (Signed)
Anesthesia Regional Block: Supraclavicular block   Pre-Anesthetic Checklist: , timeout performed,  Correct Patient, Correct Site, Correct Laterality,  Correct Procedure, Correct Position, site marked,  Risks and benefits discussed,  Surgical consent,  Pre-op evaluation,  At surgeon's request and post-op pain management  Laterality: Right  Prep: Maximum Sterile Barrier Precautions used, chloraprep       Needles:  Injection technique: Single-shot  Needle Type: Echogenic Stimulator Needle     Needle Length: 9cm  Needle Gauge: 22     Additional Needles:   Procedures:,,,, ultrasound used (permanent image in chart),,    Narrative:  Start time: 01/07/2023 7:00 AM End time: 01/07/2023 7:05 AM Injection made incrementally with aspirations every 5 mL.  Performed by: Personally  Anesthesiologist: Pervis Hocking, DO  Additional Notes: Monitors applied. No increased pain on injection. No increased resistance to injection. Injection made in 5cc increments. Good needle visualization. Patient tolerated procedure well.

## 2023-01-07 NOTE — Anesthesia Procedure Notes (Signed)
Procedure Name: MAC Date/Time: 01/07/2023 7:32 AM  Performed by: Colin Benton, CRNAPre-anesthesia Checklist: Patient identified, Emergency Drugs available, Suction available and Patient being monitored Patient Re-evaluated:Patient Re-evaluated prior to induction Oxygen Delivery Method: Simple face mask Induction Type: IV induction Placement Confirmation: positive ETCO2 Dental Injury: Teeth and Oropharynx as per pre-operative assessment

## 2023-01-07 NOTE — Discharge Instructions (Signed)
Please elevate your arm.  Please move your fingers after the nerve block wears off.  Please protect your arm while the nerve block is still in place.  If you have any emergencies please call Dr. Amedeo Plenty on his cellular phone at [phone]  Your medicines have been called into your pharmacy.  Please take the antibiotic until its gone.  You may use 2 oxycodone tablets every 4-6 hours.  Take the muscle spasm.  Medicine and the nausea medicine as needed.  Once again very important to elevate move and massage her fingers to prevent swelling.  The first few days are the worst in terms of pain and discomfort.  We recommend that you to take vitamin C 1000 mg a day to promote healing. We also recommend that if you require  pain medicine that you take a stool softener to prevent constipation as most pain medicines will have constipation side effects. We recommend either Peri-Colace or Senokot and recommend that you also consider adding MiraLAX as well to prevent the constipation affects from pain medicine if you are required to use them. These medicines are over the counter and may be purchased at a local pharmacy. A cup of yogurt and a probiotic can also be helpful during the recovery process as the medicines can disrupt your intestinal environment.Keep bandage clean and dry.  Call for any problems.  No smoking.  Criteria for driving a car: you should be off your pain medicine for 7-8 hours, able to drive one handed(confident), thinking clearly and feeling able in your judgement to drive. Continue elevation as it will decrease swelling.  If instructed by MD move your fingers within the confines of the bandage/splint.  Use ice if instructed by your MD. Call immediately for any sudden loss of feeling in your hand/arm or change in functional abilities of the extremity.

## 2023-01-07 NOTE — Anesthesia Preprocedure Evaluation (Addendum)
Anesthesia Evaluation  Patient identified by MRN, date of birth, ID band Patient awake    Reviewed: Allergy & Precautions, H&P , NPO status , Patient's Chart, lab work & pertinent test results  Airway Mallampati: III  TM Distance: >3 FB Neck ROM: Limited   Comment: Mildly limited neck extension 2/2 ACDF 2021  Dental  (+) Edentulous Upper, Edentulous Lower   Pulmonary Current Smoker and Patient abstained from smoking. 5 cigg/d No inhalers   Pulmonary exam normal breath sounds clear to auscultation       Cardiovascular negative cardio ROS Normal cardiovascular exam Rhythm:Regular Rate:Normal     Neuro/Psych  negative psych ROS   GI/Hepatic negative GI ROS, Neg liver ROS,,,  Endo/Other  negative endocrine ROS    Renal/GU negative Renal ROS  negative genitourinary   Musculoskeletal  (+) Arthritis , Osteoarthritis,    Abdominal   Peds negative pediatric ROS (+)  Hematology negative hematology ROS (+)   Anesthesia Other Findings   Reproductive/Obstetrics negative OB ROS                             Anesthesia Physical Anesthesia Plan  ASA: 2  Anesthesia Plan: MAC and Regional   Post-op Pain Management: Tylenol PO (pre-op)*   Induction:   PONV Risk Score and Plan: Propofol infusion, TIVA, Treatment may vary due to age or medical condition and Midazolam  Airway Management Planned: Natural Airway and Simple Face Mask  Additional Equipment: None  Intra-op Plan:   Post-operative Plan:   Informed Consent: I have reviewed the patients History and Physical, chart, labs and discussed the procedure including the risks, benefits and alternatives for the proposed anesthesia with the patient or authorized representative who has indicated his/her understanding and acceptance.     Dental advisory given  Plan Discussed with: CRNA  Anesthesia Plan Comments:         Anesthesia Quick  Evaluation

## 2023-01-07 NOTE — Anesthesia Postprocedure Evaluation (Signed)
Anesthesia Post Note  Patient: Mark Hampton  Procedure(s) Performed: Right carpal tunnel release.  Right ulnar nerve release at the elbow.  Right scaphoid excision and 4 corner fusion with extensor pollicis longus transfer and posterior interosseous nerve neurectomy (Right: Arm Lower)     Patient location during evaluation: PACU Anesthesia Type: Regional and MAC Level of consciousness: awake and alert Pain management: pain level controlled Vital Signs Assessment: post-procedure vital signs reviewed and stable Respiratory status: spontaneous breathing, nonlabored ventilation and respiratory function stable Cardiovascular status: blood pressure returned to baseline and stable Postop Assessment: no apparent nausea or vomiting Anesthetic complications: no   No notable events documented.  Last Vitals:  Vitals:   01/07/23 1030 01/07/23 1045  BP: 135/81 (!) 150/74  Pulse: (!) 58 65  Resp: 14 13  Temp:  36.6 C  SpO2: 97% 97%    Last Pain:  Vitals:   01/07/23 1045  TempSrc:   PainSc: 0-No pain                 Pervis Hocking

## 2023-01-07 NOTE — Op Note (Signed)
Operative note January 07, 2023.  Roseanne Kaufman, MD.  Date of dictation and surgery January 07, 2023  Preoperative diagnosis right carpal tunnel syndrome.  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  Postop diagnosis: The same.  Operative procedure #1 right carpal tunnel release #2 right ulnar nerve release at the elbow in situ #3 open scaphoid excision and 4 corner fusion with TriMed plate and screw construct and autologous bone grafting.  This was a limited wrist fusion with autologous bone graft.  #4 posterior interosseous nerve neurectomy.  #5 EPL tendon transfer to the dorsal soft tissues #6 stress radiography 6 views right wrist  Surgeon Walton none  Anesthesia Block with IV sedation  Tourniquet time less than 2 hours.  Description of procedure: Patient was taken to the operative theater I saw him in the preop holding area we discussed all issues and the pre and postop plans.  In the operative theater he underwent IV sedation followed by prep and drape with Hibiclens scrub followed by Betadine scrub and paint which I performed personally.  Following this the arm was elevated and timeout was observed.  Tourniquet was insufflated.  At this time I made a 2 inch incision at the posterior medial elbow dissection was carried down and the patient underwent ulnar nerve release about the arcade of Struthers, medial intermuscular septum, 2 heads of the FCU, cubital tunnel and Osborne's ligament.  The nerve was not excessively mobile.  Given his age and preoperative factors I was hoping not to transpose the nerve and it sat reasonably in the region without significant compressive forces or pinching against the triceps.  This time we irrigated copiously and ultimately closed wound with Prolene.  Hemostasis was stable.  Following this attention was turned towards the carpal tunnel.  The patient underwent incision 2  cm followed by retraction incision of the palmar fascia.  Following this transverse carpal ligament was released.  Patient had a palmaris brevis muscle which I resected.  The superficial palmar arch was Protected.  Proximal leaflet and portions of the antebrachial fascia were released under direct 4.5 loupe magnification with retractor held nicely.  This completed the carpal tunnel release.  The patient tolerated this well.  Following this we irrigated and closed wound after hemostasis was secured of course with bipolar electrocautery.  Following this we then made a utilitarian approach to the wrist dorsally.  Dissection was carried down and the patient underwent EPL decompression and anterior transfer with Z-lengthening of the extensor retinaculum.  Following this posterior interosseous nerve was identified and underwent a correction and cauterization technique.  This completed the EPL transfer to the dorsal soft tissue with compartment release and the posterior interosseous nerve neurectomy.  I then entered the capsule and remove quite a bit of loose body and debrided spurs.  I then performed a scaphoid excision followed by preparation of the capitate lunate triquetrum and hamate for 4 corner fusion.  The radial lunate fossa was stable.  There was a massive amount of arthritis about the midcarpal joint.  I was able to get down to nice cancellous bone and following this pin the joint and placed with the lunate in neutral position followed by reaming and application of a TriMed plate with autologous bone graft obtained from the scaphoid and bony spurs.  I was meticulous with the cancellous bone graft harvest and there were no complicating features.  We placed screws in the lunate  followed by hamate then capitate and triquetrum and additional screws were then placed.  There were no complicating features.  X-rays looked excellent.  This was a 4 corner fusion/limited wrist fusion with autologous bone graft.   There were no issues or complications.  Irrigation was accomplished and the patient underwent closure of the capsule followed by closure of the retinaculum in a Z-lengthening fashion.  The patient had the skin is closed with Prolene.  There were no complications.  Excellent refill and soft compartments were noted.  He was placed in a long-arm splint after dressing application with the thumb spica component and wrist immobilized.  Going forward we will plan for range of motion about the elbow at 2 weeks postop and short arm cast.  At 6 weeks we will begin range of motion to the wrist predicated upon x-rays looking well.  I discussed all issues plans and concerns.  Will go a little bit slow given his health and other issues and make sure the construct remained stable and the bone is incorporating nicely.  I discussed all issues with his family.  It was a pleasure to see him today and participate in his care plan.  This was a 4 corner fusion with EPL transfer and PIN neurectomy as well as carpal tunnel release and cubital tunnel release right upper extremity with associated 6 view radiographic series.  Roseanne Kaufman, MD

## 2023-01-07 NOTE — H&P (Signed)
Mark Hampton is an 62 y.o. adult.   Chief Complaint: right wrist arthritis with carpal and cubital tunnel syndrome HPI: Patient presents for evaluation and treatment of the of their upper extremity predicament. The patient denies neck, back, chest or  abdominal pain. The patient notes that they have no lower extremity problems. The patients primary complaint is noted. We are planning surgical care pathway for the upper extremity.   We will plan for Right carpal tunnel release. Right ulnar nerve release at the elbow. Right scaphoid excision and 4 corner fusion with extensor pollicis longus transfer and posterior interosseous nerve neurectomy - Right   Past Medical History:  Diagnosis Date  . Arthritis    hands  . Bilateral carpal tunnel syndrome 03/29/2020    Past Surgical History:  Procedure Laterality Date  . ABCESS DRAINAGE     in the jaw area  . ANTERIOR CERVICAL DECOMP/DISCECTOMY FUSION N/A 06/28/2020   Procedure: Anterior Cervical Decompression Fusion - Cervical three-four;  Surgeon: Kary Kos, MD;  Location: [facility];  Service: Neurosurgery;  Laterality: N/A;  . ANTERIOR CERVICAL DECOMP/DISCECTOMY FUSION N/A 11/05/2021   Procedure: Anterior Cervical Discectomy and Fusion Cervical Four-Five/Cervical Five-Six/Cervical Six-Seven with Hardware Removal Cervical Three-Four;  Surgeon: Kary Kos, MD;  Location: [facility];  Service: Neurosurgery;  Laterality: N/A;  . SPINE SURGERY  2021    Family History  Problem Relation Age of Onset  . Colon cancer Neg Hx   . Colon polyps Neg Hx   . Esophageal cancer Neg Hx   . Rectal cancer Neg Hx   . Stomach cancer Neg Hx    Social History:  reports that he has been smoking cigarettes. He has a 6.25 pack-year smoking history. He has never used smokeless tobacco. He reports current alcohol use of about 6.0 standard drinks of alcohol per week. He reports that he does not use drugs.  Allergies: No Known Allergies  Medications Prior to Admission   Medication Sig Dispense Refill  . acetaminophen (TYLENOL) 500 MG tablet Take 1,000 mg by mouth every 6 (six) hours as needed for moderate pain.    . meloxicam (MOBIC) 7.5 MG tablet Take 1 tablet (7.5 mg total) by mouth daily as needed for pain. (Patient taking differently: Take 7.5 mg by mouth daily.) 30 tablet 1  . nicotine (NICODERM CQ) 21 mg/24hr patch Place 1 patch (21 mg total) onto the skin daily. (Patient taking differently: Place 21 mg onto the skin daily as needed (smoking cessation).) 28 patch 0  . nicotine polacrilex (NICORETTE) 4 MG gum Take 1 each (4 mg total) by mouth as needed for smoking cessation. 100 tablet 0  . Cholecalciferol (VITAMIN D3) 10 MCG (400 UNIT) tablet Take 400 Units by mouth daily.    . Misc. Devices (WRIST BRACE) MISC 2 Devices by Does not apply route daily. Right and left arm 2 each 0  . Multiple Vitamin (MULTIVITAMIN) capsule Take 1 capsule by mouth daily.    . vitamin E 180 MG (400 UNITS) capsule Take 400 Units by mouth daily.      No results found for this or any previous visit (from the past 48 hour(s)). No results found.  Review of Systems  Respiratory: Negative.    Cardiovascular: Negative.   Gastrointestinal: Negative.   Endocrine: Negative.    Blood pressure 123/68, pulse 77, temperature 98.3 F (36.8 C), temperature source Oral, resp. rate 18, height 5\' 8"  (1.727 m), weight 65.8 kg, SpO2 98 %. Physical Exam right CTS and CUTS with  OA about the wrist, thumb CMC and index and middle finger MCP joints.--Plan for surgical reconstruction The patient is alert and oriented in no acute distress. The patient complains of pain in the affected upper extremity.  The patient is noted to have a normal HEENT exam. Lung fields show equal chest expansion and no shortness of breath. Abdomen exam is nontender without distention. Lower extremity examination does not show any fracture dislocation or blood clot symptoms. Pelvis is stable and the neck and back are  stable and nontender.  Assessment/Plan We are planning surgery for your upper extremity. The risk and benefits of surgery to include risk of bleeding, infection, anesthesia,  damage to normal structures and failure of the surgery to accomplish its intended goals of relieving symptoms and restoring function have been discussed in detail. With this in mind we plan to proceed. I have specifically discussed with the patient the pre-and postoperative regime and the dos and don'ts and risk and benefits in great detail. Risk and benefits of surgery also include risk of dystrophy(CRPS), chronic nerve pain, failure of the healing process to go onto completion and other inherent risks of surgery The relavent the pathophysiology of the disease/injury process, as well as the alternatives for treatment and postoperative course of action has been discussed in great detail with the patient who desires to proceed.  We will do everything in our power to help you (the patient) restore function to the upper extremity. It is a pleasure to see this patient today.  Plan [id number] carpal tunnel release. Right ulnar nerve release at the elbow. Right scaphoid excision and 4 corner fusion with extensor pollicis longus transfer and posterior interosseous nerve neurectomy - Right  Willa Frater III, MD 01/07/2023, 6:02 AM

## 2023-01-07 NOTE — Transfer of Care (Signed)
Immediate Anesthesia Transfer of Care Note  Patient: Mark Hampton  Procedure(s) Performed: Right carpal tunnel release.  Right ulnar nerve release at the elbow.  Right scaphoid excision and 4 corner fusion with extensor pollicis longus transfer and posterior interosseous nerve neurectomy (Right: Arm Lower)  Patient Location: PACU  Anesthesia Type:MAC combined with regional for post-op pain  Level of Consciousness: awake, alert , oriented, and patient cooperative  Airway & Oxygen Therapy: Patient Spontanous Breathing  Post-op Assessment: Report given to RN and Post -op Vital signs reviewed and stable  Post vital signs: Reviewed and stable  Last Vitals:  Vitals Value Taken Time  BP 126/70 85 01/07/23 1004  Temp    Pulse 65 01/07/23 1003  Resp 15 01/07/23 1003  SpO2 96 % 01/07/23 1003  Vitals shown include unvalidated device data.  Last Pain:  Vitals:   01/07/23 0605  TempSrc:   PainSc: 8          Complications: No notable events documented.

## 2023-01-08 ENCOUNTER — Encounter (HOSPITAL_COMMUNITY): Payer: Self-pay | Admitting: Orthopedic Surgery

## 2023-02-05 ENCOUNTER — Encounter: Payer: Self-pay | Admitting: Nurse Practitioner

## 2023-06-06 ENCOUNTER — Ambulatory Visit: Payer: Medicaid Other | Admitting: Nurse Practitioner

## 2023-06-24 ENCOUNTER — Ambulatory Visit: Payer: Medicaid Other | Admitting: Sports Medicine

## 2023-06-25 ENCOUNTER — Encounter: Payer: Self-pay | Admitting: Sports Medicine

## 2023-06-25 ENCOUNTER — Ambulatory Visit (INDEPENDENT_AMBULATORY_CARE_PROVIDER_SITE_OTHER): Payer: Medicaid Other | Admitting: Sports Medicine

## 2023-06-25 VITALS — BP 138/88 | HR 54 | Temp 97.7°F | Resp 16 | Ht 68.0 in | Wt 137.4 lb

## 2023-06-25 DIAGNOSIS — F17200 Nicotine dependence, unspecified, uncomplicated: Secondary | ICD-10-CM | POA: Diagnosis not present

## 2023-06-25 DIAGNOSIS — M19012 Primary osteoarthritis, left shoulder: Secondary | ICD-10-CM | POA: Diagnosis not present

## 2023-06-25 NOTE — Progress Notes (Signed)
Careteam: Patient Care Team: Venita Sheffield, MD as PCP - General (Internal Medicine)  PLACE OF SERVICE:  [facility]  Advanced Directive information    No Known Allergies  No chief complaint on file.    HPI: Patient is a 62 y.o. adult is here for follow up  Left shoulder pain -  Chronic , 2 yrs  No recent trauma C/o pain when lifting his shoulder  Able to do his ADLS Takes tylenol with no improvement  Tried injection in the past with no improvement in his pain levels Following with orthopedics   Smoking  Smokes about 5 cigarrtttes / day  Smoking since 38 yrs Denies coughing SOB, night sweats Lost 8 pounds since march  Reports poor appetite  Denies fatigue   Denies chest pain, palpitations, SOB, abdominal pain, nausea, vomiting, dysuria Tries to be active , does his yard work    Review of Systems:  Review of Systems  Constitutional:  Negative for chills and fever.  HENT:  Negative for congestion and sore throat.   Eyes:  Negative for double vision.  Respiratory:  Negative for cough, sputum production and shortness of breath.   Cardiovascular:  Negative for chest pain, palpitations and leg swelling.  Gastrointestinal:  Negative for abdominal pain, heartburn and nausea.  Genitourinary:  Negative for dysuria, frequency and hematuria.  Musculoskeletal:  Negative for falls and myalgias.  Neurological:  Negative for dizziness, sensory change and focal weakness.    Past Medical History:  Diagnosis Date   Arthritis    hands   Bilateral carpal tunnel syndrome 03/29/2020   Past Surgical History:  Procedure Laterality Date   ABCESS DRAINAGE     in the jaw area   ANTERIOR CERVICAL DECOMP/DISCECTOMY FUSION N/A 06/28/2020   Procedure: Anterior Cervical Decompression Fusion - Cervical three-four;  Surgeon: Donalee Citrin, MD;  Location: [facility] OR;  Service: Neurosurgery;  Laterality: N/A;   ANTERIOR CERVICAL DECOMP/DISCECTOMY FUSION N/A 11/05/2021   Procedure:  Anterior Cervical Discectomy and Fusion Cervical Four-Five/Cervical Five-Six/Cervical Six-Seven with Hardware Removal Cervical Three-Four;  Surgeon: Donalee Citrin, MD;  Location: [facility] OR;  Service: Neurosurgery;  Laterality: N/A;   SPINE SURGERY  2021   ULNAR NERVE TRANSPOSITION Right 01/07/2023   Procedure: Right carpal tunnel release.  Right ulnar nerve release at the elbow.  Right scaphoid excision and 4 corner fusion with extensor pollicis longus transfer and posterior interosseous nerve neurectomy;  Surgeon: Dominica Severin, MD;  Location: [facility] OR;  Service: Orthopedics;  Laterality: Right;  2 hrs Block with IV Sedation   Social History:   reports that he has been smoking cigarettes. He has a 6.3 pack-year smoking history. He has never used smokeless tobacco. He reports current alcohol use of about 6.0 standard drinks of alcohol per week. He reports that he does not use drugs.  Family History  Problem Relation Age of Onset   Colon cancer Neg Hx    Colon polyps Neg Hx    Esophageal cancer Neg Hx    Rectal cancer Neg Hx    Stomach cancer Neg Hx     Medications: Patient's Medications  New Prescriptions   No medications on file  Previous Medications   ACETAMINOPHEN (TYLENOL) 500 MG TABLET    Take 1,000 mg by mouth every 6 (six) hours as needed for moderate pain.   CHOLECALCIFEROL (VITAMIN D3) 10 MCG (400 UNIT) TABLET    Take 400 Units by mouth daily.   MISC. DEVICES (WRIST BRACE) MISC    2  Devices by Does not apply route daily. Right and left arm   MULTIPLE VITAMIN (MULTIVITAMIN) CAPSULE    Take 1 capsule by mouth daily.   NICOTINE (NICODERM CQ) 21 MG/24HR PATCH    Place 1 patch (21 mg total) onto the skin daily.   NICOTINE POLACRILEX (NICORETTE) 4 MG GUM    Take 1 each (4 mg total) by mouth as needed for smoking cessation.   VITAMIN E 180 MG (400 UNITS) CAPSULE    Take 400 Units by mouth daily.  Modified Medications   No medications on file  Discontinued Medications   MELOXICAM (MOBIC) 7.5  MG TABLET    Take 1 tablet (7.5 mg total) by mouth daily as needed for pain.    Physical Exam:  Vitals:   06/25/23 1200 06/25/23 1205  BP: (!) 140/88 138/88  Pulse: (!) 54   Resp: 16   Temp: 97.7 F (36.5 C)   SpO2: 98%   Weight: 137 lb 6.4 oz (62.3 kg)   Height: 5\' 8"  (1.727 m)    Body mass index is 20.89 kg/m. Wt Readings from Last 3 Encounters:  06/25/23 137 lb 6.4 oz (62.3 kg)  01/07/23 145 lb (65.8 kg)  01/03/23 145 lb 4.8 oz (65.9 kg)    Physical Exam Constitutional:      Appearance: Normal appearance.  HENT:     Head: Normocephalic and atraumatic.  Cardiovascular:     Rate and Rhythm: Normal rate and regular rhythm.     Heart sounds: No murmur heard. Pulmonary:     Effort: Pulmonary effort is normal. No respiratory distress.     Breath sounds: Normal breath sounds. No wheezing.  Abdominal:     General: Bowel sounds are normal. There is no distension.     Tenderness: There is no abdominal tenderness. There is no guarding or rebound.  Musculoskeletal:        General: No swelling or tenderness.  Skin:    General: Skin is dry.  Neurological:     Mental Status: He is alert. Mental status is at baseline.     Sensory: No sensory deficit.     Motor: No weakness.     Labs reviewed: Basic Metabolic Panel: Recent Labs    12/06/22 1015  NA 140  K 4.4  CL 107  CO2 23  GLUCOSE 57*  BUN 14  CREATININE 1.25  CALCIUM 9.4   Liver Function Tests: Recent Labs    12/06/22 1015  AST 16  ALT 11  BILITOT 0.3  PROT 6.8   No results for input(s): "LIPASE", "AMYLASE" in the last 8760 hours. No results for input(s): "AMMONIA" in the last 8760 hours. CBC: Recent Labs    12/06/22 1015  WBC 6.5  NEUTROABS 3,055  HGB 13.6  HCT 39.6  MCV 95.2  PLT 242   Lipid Panel: Recent Labs    12/06/22 1015  CHOL 174  HDL 74  LDLCALC 79  TRIG 116  CHOLHDL 2.4   TSH: No results for input(s): "TSH" in the last 8760 hours. A1C: Lab Results  Component Value  Date   HGBA1C 5.3 03/07/2020     Assessment/Plan 1. Primary osteoarthritis of left shoulder Will refer to PT  Instructed patient to use lidocaine patches Take tylenol prn for pain - Ambulatory referral to Physical Therapy  2. Smoking Counseled on smoking cessation - time spent < 3 min - CT CHEST LUNG CA SCREEN LOW DOSE W/O CM; Future - US AORTA DUPLEX LIMITED; Future  No follow-ups on file.: 4 months

## 2023-07-11 ENCOUNTER — Ambulatory Visit (HOSPITAL_COMMUNITY)
Admission: RE | Admit: 2023-07-11 | Discharge: 2023-07-11 | Disposition: A | Discharge status: Home / Self Care | Payer: Medicaid Other | Source: Ambulatory Visit | Attending: Sports Medicine | Admitting: Sports Medicine

## 2023-07-11 ENCOUNTER — Other Ambulatory Visit: Payer: Self-pay | Admitting: Sports Medicine

## 2023-07-11 ENCOUNTER — Ambulatory Visit (HOSPITAL_COMMUNITY)
Admission: RE | Admit: 2023-07-11 | Discharge: 2023-07-11 | Disposition: A | Discharge status: Home / Self Care | Payer: Medicaid Other | Source: Ambulatory Visit | Attending: Sports Medicine

## 2023-07-11 DIAGNOSIS — M19012 Primary osteoarthritis, left shoulder: Secondary | ICD-10-CM

## 2023-07-11 DIAGNOSIS — F17200 Nicotine dependence, unspecified, uncomplicated: Secondary | ICD-10-CM | POA: Insufficient documentation

## 2023-07-15 DIAGNOSIS — M7542 Impingement syndrome of left shoulder: Secondary | ICD-10-CM | POA: Insufficient documentation

## 2023-07-28 ENCOUNTER — Other Ambulatory Visit: Payer: Self-pay | Admitting: Sports Medicine

## 2023-07-28 MED ORDER — ASPIRIN 81 MG PO TBEC: 81.0000 mg | DELAYED_RELEASE_TABLET | Freq: Every day | ORAL | 1 refills | Status: DC

## 2023-07-28 MED ORDER — ATORVASTATIN CALCIUM 10 MG PO TABS: 10.0000 mg | ORAL_TABLET | Freq: Every day | ORAL | 3 refills | Status: DC

## 2023-08-01 ENCOUNTER — Telehealth: Payer: Self-pay | Admitting: Sports Medicine

## 2023-08-01 MED ORDER — NICOTINE 21 MG/24HR TD PT24: 21.0000 mg | MEDICATED_PATCH | Freq: Every day | TRANSDERMAL | 0 refills | Status: DC

## 2023-08-01 NOTE — Telephone Encounter (Signed)
Venita Sheffield, MD  [facility] Pool32 minutes ago (12:32 PM)    Nicotine patch sent to his pharmacy

## 2023-08-01 NOTE — Telephone Encounter (Signed)
-----   Message from [facility] W sent at 07/31/2023  1:53 PM EDT ----- Patient verbalized understanding and is currently trying to stop smoking. Patient states he would like some patches to help him stop smoking if possible.

## 2023-10-28 ENCOUNTER — Ambulatory Visit: Payer: Medicaid Other | Admitting: Sports Medicine

## 2023-11-04 ENCOUNTER — Ambulatory Visit (INDEPENDENT_AMBULATORY_CARE_PROVIDER_SITE_OTHER): Payer: Medicaid Other | Admitting: Sports Medicine

## 2023-11-04 ENCOUNTER — Encounter: Payer: Self-pay | Admitting: Sports Medicine

## 2023-11-04 VITALS — BP 110/60 | HR 89 | Temp 96.6°F | Resp 16 | Ht 68.0 in | Wt 145.8 lb

## 2023-11-04 DIAGNOSIS — R222 Localized swelling, mass and lump, trunk: Secondary | ICD-10-CM

## 2023-11-04 DIAGNOSIS — M5412 Radiculopathy, cervical region: Secondary | ICD-10-CM

## 2023-11-04 DIAGNOSIS — M25512 Pain in left shoulder: Secondary | ICD-10-CM | POA: Diagnosis not present

## 2023-11-04 DIAGNOSIS — G629 Polyneuropathy, unspecified: Secondary | ICD-10-CM | POA: Diagnosis not present

## 2023-11-04 DIAGNOSIS — Z1211 Encounter for screening for malignant neoplasm of colon: Secondary | ICD-10-CM

## 2023-11-04 DIAGNOSIS — M79671 Pain in right foot: Secondary | ICD-10-CM

## 2023-11-04 DIAGNOSIS — G8929 Other chronic pain: Secondary | ICD-10-CM

## 2023-11-04 DIAGNOSIS — E782 Mixed hyperlipidemia: Secondary | ICD-10-CM

## 2023-11-04 DIAGNOSIS — N1831 Chronic kidney disease, stage 3a: Secondary | ICD-10-CM

## 2023-11-04 DIAGNOSIS — Z23 Encounter for immunization: Secondary | ICD-10-CM

## 2023-11-04 MED ORDER — GABAPENTIN 300 MG PO CAPS
300.0000 mg | ORAL_CAPSULE | Freq: Three times a day (TID) | ORAL | 3 refills | Status: DC
Start: 2023-11-04 — End: 2024-06-17

## 2023-11-04 NOTE — Progress Notes (Signed)
 Careteam: Patient Care Team: Mark Madden, MD as PCP - General (Internal Medicine)  PLACE OF SERVICE:  [facility]  Advanced Directive information Does Patient Have a Medical Advance Directive?: No, Would patient like information on creating a medical advance directive?: No - Patient declined  No Known Allergies  Chief Complaint  Patient presents with   Medical Management of Chronic Issues    4 month follow up.    Immunizations    Discuss the need for Influenza vaccine, Pne vaccine, Covid Booster, and Shingrix vaccine.    Health Maintenance    Discuss the need for Pap smear, Colonoscopy, and Mammogram.    Discussed the use of AI scribe software for clinical note transcription with the patient, who gave verbal consent to proceed.  History of Present Illness   The patient, with a history of carpal tunnel syndrome, presents with persistent left shoulder pain, extending from the shoulder to the fingertips. The pain, described as a constant burning and aching sensation, has been present for several months since his carpal tunnel surgery. The patient rates the pain as an 8 out of 10 on the pain scale. Over-the-counter ibuprofen  provides no relief. The patient also reports tingling and numbness in the left hand, affecting his grip strength and causing him to be cautious when holding objects.  In addition to the arm pain, the patient has been experiencing pain in his left foot for over a year. The pain is localized to a bony enlargement on the foot and the sole, described as a burning sensation. The patient has a history of a broken foot, which has since developed a lump that causes significant discomfort.    The patient also reports neck pain, which radiates down the left arm. He has a history of neck surgery with screws placed in the neck. The patient has not seen a specialist for the neck in over a year.  The patient also mentions a decrease in sexual activity over the past five  months, causing distress. He has not previously taken any medication for this issue.  The patient has a history of smoking, though he reports a decrease in frequency. He also has a history of aneurysm, for which he is due for a follow-up ultrasound in three years. He has been advised to seek emergency care if he experiences severe abdominal pain.  The patient's medication regimen includes aspirin , atorvastatin , and gabapentin . He reports compliance with these medications. The patient is due for a pneumonia vaccine and a colonoscopy. He is also due for a follow-up CT scan, as a previous scan showed soft tissue in the anterior chest. The patient denies any current respiratory symptoms.          Review of Systems:  Review of Systems  Constitutional:  Negative for chills and fever.  HENT:  Negative for congestion and sore throat.   Eyes:  Negative for double vision.  Respiratory:  Negative for cough, sputum production and shortness of breath.   Cardiovascular:  Negative for chest pain, palpitations and leg swelling.  Gastrointestinal:  Negative for abdominal pain, heartburn and nausea.  Genitourinary:  Negative for dysuria, frequency and hematuria.  Musculoskeletal:  Positive for joint pain and neck pain. Negative for falls and myalgias.  Neurological:  Negative for dizziness, sensory change and focal weakness.   Negative unless indicated in HPI.   Past Medical History:  Diagnosis Date   Arthritis    hands   Bilateral carpal tunnel syndrome 03/29/2020   Past Surgical  History:  Procedure Laterality Date   ABCESS DRAINAGE     in the jaw area   ANTERIOR CERVICAL DECOMP/DISCECTOMY FUSION N/A 06/28/2020   Procedure: Anterior Cervical Decompression Fusion - Cervical three-four;  Surgeon: Onetha Kuba, MD;  Location: [facility] OR;  Service: Neurosurgery;  Laterality: N/A;   ANTERIOR CERVICAL DECOMP/DISCECTOMY FUSION N/A 11/05/2021   Procedure: Anterior Cervical Discectomy and Fusion Cervical  Four-Five/Cervical Five-Six/Cervical Six-Seven with Hardware Removal Cervical Three-Four;  Surgeon: Onetha Kuba, MD;  Location: [facility] OR;  Service: Neurosurgery;  Laterality: N/A;   SPINE SURGERY  2021   ULNAR NERVE TRANSPOSITION Right 01/07/2023   Procedure: Right carpal tunnel release.  Right ulnar nerve release at the elbow.  Right scaphoid excision and 4 corner fusion with extensor pollicis longus transfer and posterior interosseous nerve neurectomy;  Surgeon: Camella Fallow, MD;  Location: [facility] OR;  Service: Orthopedics;  Laterality: Right;  2 hrs Block with IV Sedation   Social History:   reports that he has been smoking cigarettes. He has a 6.3 pack-year smoking history. He has never used smokeless tobacco. He reports current alcohol use of about 6.0 standard drinks of alcohol per week. He reports that he does not use drugs.  Family History  Problem Relation Age of Onset   Colon cancer Neg Hx    Colon polyps Neg Hx    Esophageal cancer Neg Hx    Rectal cancer Neg Hx    Stomach cancer Neg Hx     Medications: Patient's Medications  New Prescriptions   GABAPENTIN  (NEURONTIN ) 300 MG CAPSULE    Take 1 capsule (300 mg total) by mouth 3 (three) times daily.  Previous Medications   ACETAMINOPHEN  (TYLENOL ) 500 MG TABLET    Take 1,000 mg by mouth every 6 (six) hours as needed for moderate pain.   ASPIRIN  EC 81 MG TABLET    Take 1 tablet (81 mg total) by mouth daily. Swallow whole.   ATORVASTATIN  (LIPITOR) 10 MG TABLET    Take 1 tablet (10 mg total) by mouth daily.   CHOLECALCIFEROL (VITAMIN D3) 10 MCG (400 UNIT) TABLET    Take 400 Units by mouth daily.   MISC. DEVICES (WRIST BRACE) MISC    2 Devices by Does not apply route daily. Right and left arm   MULTIPLE VITAMIN (MULTIVITAMIN) CAPSULE    Take 1 capsule by mouth daily.   NICOTINE  (NICODERM CQ ) 21 MG/24HR PATCH    Place 1 patch (21 mg total) onto the skin daily.   NICOTINE  POLACRILEX (NICORETTE ) 4 MG GUM    Take 1 each (4 mg total) by mouth  as needed for smoking cessation.   VITAMIN E  180 MG (400 UNITS) CAPSULE    Take 400 Units by mouth daily.  Modified Medications   No medications on file  Discontinued Medications   NICOTINE  (NICODERM CQ  - DOSED IN MG/24 HOURS) 21 MG/24HR PATCH    Place 1 patch (21 mg total) onto the skin daily.    Physical Exam: Vitals:   11/04/23 1447  BP: 110/60  Pulse: 89  Resp: 16  Temp: (!) 96.6 F (35.9 C)  SpO2: 99%  Weight: 145 lb 12.8 oz (66.1 kg)  Height: 5' 8 (1.727 m)   Body mass index is 22.17 kg/m. BP Readings from Last 3 Encounters:  11/04/23 110/60  06/25/23 138/88  01/07/23 (!) 150/74   Wt Readings from Last 3 Encounters:  11/04/23 145 lb 12.8 oz (66.1 kg)  06/25/23 137 lb 6.4 oz (62.3 kg)  01/07/23  145 lb (65.8 kg)    Physical Exam Constitutional:      Appearance: Normal appearance.  HENT:     Head: Normocephalic and atraumatic.  Cardiovascular:     Rate and Rhythm: Normal rate and regular rhythm.  Pulmonary:     Effort: Pulmonary effort is normal. No respiratory distress.     Breath sounds: Normal breath sounds. No wheezing.  Abdominal:     General: Bowel sounds are normal. There is no distension.     Tenderness: There is no abdominal tenderness. There is no guarding or rebound.     Comments:    Musculoskeletal:        General: Tenderness present.     Comments: Ant tenderness Left shoulder  Empty can positive  Left hand good grip strength  Rt foot- 1.5cm soft mass on the plantar aspect of mid foot  No signs of infection  Pedal pulse intact  Bunion on Rt foot  Neurological:     Mental Status: He is alert. Mental status is at baseline.     Sensory: No sensory deficit.     Motor: No weakness.     Labs reviewed: Basic Metabolic Panel: Recent Labs    12/06/22 1015  NA 140  K 4.4  CL 107  CO2 23  GLUCOSE 57*  BUN 14  CREATININE 1.25  CALCIUM  9.4   Liver Function Tests: Recent Labs    12/06/22 1015  AST 16  ALT 11  BILITOT 0.3  PROT 6.8    No results for input(s): LIPASE, AMYLASE in the last 8760 hours. No results for input(s): AMMONIA in the last 8760 hours. CBC: Recent Labs    12/06/22 1015  WBC 6.5  NEUTROABS 3,055  HGB 13.6  HCT 39.6  MCV 95.2  PLT 242   Lipid Panel: Recent Labs    12/06/22 1015  CHOL 174  HDL 74  LDLCALC 79  TRIG 116  CHOLHDL 2.4   TSH: No results for input(s): TSH in the last 8760 hours. A1C: Lab Results  Component Value Date   HGBA1C 5.3 03/07/2020     Assessment/Plan Left Shoulder Pain Chronic, severe pain from shoulder to fingertips, described as burning and aching. Unresponsive to ibuprofen  and previous physical therapy. Previous surgery for carpal tunnel syndrome did not alleviate symptoms. -Refer to orthopedic surgeon for evaluation and potential surgical intervention. -Prescribe gabapentin  for neuropathic pain, with caution regarding potential drowsiness.  Cervical radiculopathy  Ambulatory referral to Neurosurgery  Right Foot Pain Chronic pain in right foot, with a soft lump on the bottom of the foot. Previous diagnosis of bunion. Pain exacerbated by weight-bearing. -Refer to podiatrist for evaluation and potential surgical intervention.  Abdominal Aortic Aneurysm Stable aneurysm identified on previous ultrasound. No current symptoms. -Schedule follow-up ultrasound in 3 years. -Advise patient to seek immediate medical attention for severe abdominal pain.  Hyperlipidemia Evidence of fat deposition in blood vessels on CT scan. Patient has prescription for atorvastatin  but unclear if taking regularly. -Encourage adherence to atorvastatin  for cholesterol management.    Erectile Dysfunction Patient reports inability to engage in sexual activity for the past five months, causing distress. -Prescribe sildenafil  (Viagra ), with caution regarding potential cardiovascular risks.  General Health Maintenance -Order repeat CT scan of chest in 3 months to monitor  soft tissue in anterior chest. -Advise patient to quit smoking to reduce risk of aneurysm expansion and progression of emphysema. -Schedule colonoscopy for routine colorectal cancer screening. -Administer pneumococcal vaccine at next visit. -Schedule follow-up visit  in 3 months.   Need for influenza vaccination (Primary) Flu Vaccine Trivalent High Dose (Fluad)    CKD stage 3a, GFR 45-59 ml/min (HCC)  Basic Metabolic Panel with eGFR   Screening for colon cancer - Ambulatory referral to Gastroenterology  Other orders - sildenafil  (VIAGRA ) 25 MG tablet; Take 1 tablet (25 mg total) by mouth daily as needed for erectile dysfunction.  Dispense: 10 tablet; Refill: 0   No follow-ups on file.:   45 min  Total time spent for obtaining history,  performing a medically appropriate examination and evaluation, reviewing the tests,ordering  tests,  documenting clinical information in the electronic or other health record, independently interpreting results ,care coordination (not separately reported)

## 2023-11-07 MED ORDER — SILDENAFIL CITRATE 25 MG PO TABS: 25.0000 mg | ORAL_TABLET | Freq: Every day | ORAL | 0 refills | Status: DC | PRN

## 2023-11-10 ENCOUNTER — Other Ambulatory Visit: Payer: Medicaid Other

## 2023-11-14 ENCOUNTER — Ambulatory Visit (INDEPENDENT_AMBULATORY_CARE_PROVIDER_SITE_OTHER): Payer: Medicaid Other | Admitting: Podiatry

## 2023-11-14 ENCOUNTER — Ambulatory Visit (INDEPENDENT_AMBULATORY_CARE_PROVIDER_SITE_OTHER): Payer: Medicaid Other

## 2023-11-14 ENCOUNTER — Encounter: Payer: Self-pay | Admitting: Podiatry

## 2023-11-14 DIAGNOSIS — M722 Plantar fascial fibromatosis: Secondary | ICD-10-CM

## 2023-11-14 DIAGNOSIS — M21611 Bunion of right foot: Secondary | ICD-10-CM

## 2023-11-14 MED ORDER — TRIAMCINOLONE ACETONIDE 10 MG/ML IJ SUSP
10.0000 mg | Freq: Once | INTRAMUSCULAR | Status: AC
  Administered 2023-11-14: 10 mg via INTRA_ARTICULAR

## 2023-11-17 NOTE — Progress Notes (Signed)
Subjective:   Patient ID: Mark Hampton, adult   DOB: 63 y.o.   MRN: [id number]   HPI Patient presents with quite a bit of pain in the mid arch area right with fluid buildup in the area and does have severe bunion deformity bilateral that he has not done anything with and can become difficult to wear shoe gear.  He does not currently smoke he is not active   Review of Systems  All other systems reviewed and are negative.       Objective:  Physical Exam Vitals and nursing note reviewed.  Constitutional:      Appearance: He is well-developed.  Pulmonary:     Effort: Pulmonary effort is normal.  Musculoskeletal:        General: Normal range of motion.  Skin:    General: Skin is warm.  Neurological:     Mental Status: He is alert.     Neurovascular status was found to be intact moderate diminishment of DP PT pulses bilateral.  Patient has severe structural deformity first metatarsal bilateral with arthritis around the joints also noted.  Patient has inflammation pain of the mid arch area right that has been sore and the pain in his big toe joints can be quite bothersome      Assessment:  Acute plantar fasciitis bilateral right severe structural bunion deformity left and right with deviation and elevation of the hallux left over right     Plan:  H&P reviewed I did do sterile prep I injected the mid arch right 3 mg Kenalog 5 mg Xylocaine and I then went ahead discussed the structural deformity he is interested as is his caregiver and a correction of this and I do think probable first MPJ fusion would be the best solution for him and am going to refer him to Dr. Allena Katz who could do this at the hospital for him.  All questions answered for today  X-rays indicate severe elevation of the 1 2 intermetatarsal angle with deviation of the hallux left over right

## 2023-11-25 ENCOUNTER — Ambulatory Visit
Admission: RE | Admit: 2023-11-25 | Discharge: 2023-11-25 | Disposition: A | Discharge status: Home / Self Care | Payer: Medicaid Other | Source: Ambulatory Visit | Attending: Sports Medicine | Admitting: Sports Medicine

## 2023-11-25 DIAGNOSIS — R222 Localized swelling, mass and lump, trunk: Secondary | ICD-10-CM

## 2023-11-25 MED ORDER — IOPAMIDOL (ISOVUE-300) INJECTION 61%
500.0000 mL | Freq: Once | INTRAVENOUS | Status: AC | PRN
  Administered 2023-11-25: 75 mL via INTRAVENOUS

## 2023-12-02 ENCOUNTER — Telehealth: Payer: Self-pay | Admitting: Sports Medicine

## 2023-12-02 NOTE — Telephone Encounter (Signed)
Spoke with the patient and informed CT results Pt denies muscle weakness, cough,sob, weight loss, blurring or double vision  Will check serological testing for Myasthenia gravis at his next visit in April

## 2023-12-05 ENCOUNTER — Ambulatory Visit (INDEPENDENT_AMBULATORY_CARE_PROVIDER_SITE_OTHER): Payer: Medicaid Other | Admitting: Podiatry

## 2023-12-05 ENCOUNTER — Ambulatory Visit: Payer: Medicaid Other | Admitting: Podiatry

## 2023-12-05 ENCOUNTER — Encounter: Payer: Self-pay | Admitting: Podiatry

## 2023-12-05 VITALS — Ht 68.0 in | Wt 145.8 lb

## 2023-12-05 DIAGNOSIS — M2042 Other hammer toe(s) (acquired), left foot: Secondary | ICD-10-CM | POA: Diagnosis not present

## 2023-12-05 DIAGNOSIS — M19072 Primary osteoarthritis, left ankle and foot: Secondary | ICD-10-CM

## 2023-12-05 DIAGNOSIS — Z01818 Encounter for other preprocedural examination: Secondary | ICD-10-CM

## 2023-12-05 DIAGNOSIS — M24575 Contracture, left foot: Secondary | ICD-10-CM

## 2023-12-05 DIAGNOSIS — M24574 Contracture, right foot: Secondary | ICD-10-CM

## 2023-12-05 DIAGNOSIS — M19071 Primary osteoarthritis, right ankle and foot: Secondary | ICD-10-CM

## 2023-12-05 DIAGNOSIS — M2041 Other hammer toe(s) (acquired), right foot: Secondary | ICD-10-CM

## 2023-12-05 NOTE — Progress Notes (Addendum)
 Subjective:  Patient ID: Mark Hampton, adult    DOB: [birth year],  MRN: [id number]  Chief Complaint  Patient presents with   Foot Pain    Pt is here to f/u on right foot pain.    63 y.o. adult presents with the above complaint.  Patient presents with complaint right first metatarsophalangeal joint pain.  He states that it is painful to touch is progressive gotten worse worse with ambulation or shoe pressure he has no motion of the first metatarsophalangeal joint.  He also has complaint of hammertoe contracture of second and third digit he states been bumping against each other causing him a lot of pain he is tried all conservative care including shoe gear modification padding protecting offloading he saw Dr. Charlsie Merles as well.  He wants to discuss surgical options at this time.   Review of Systems: Negative except as noted in the HPI. Denies N/V/F/Ch.  Past Medical History:  Diagnosis Date   Arthritis    hands   Bilateral carpal tunnel syndrome 03/29/2020   Cervical radiculopathy    Spinal stenosis in cervical region     Current Outpatient Medications:    acetaminophen (TYLENOL) 500 MG tablet, Take 1,000 mg by mouth every 6 (six) hours as needed for moderate pain., Disp: , Rfl:    atorvastatin (LIPITOR) 10 MG tablet, Take 1 tablet (10 mg total) by mouth daily., Disp: 90 tablet, Rfl: 3   Cholecalciferol (VITAMIN D3) 10 MCG (400 UNIT) tablet, Take 400 Units by mouth daily with lunch., Disp: , Rfl:    gabapentin (NEURONTIN) 300 MG capsule, Take 1 capsule (300 mg total) by mouth 3 (three) times daily. (Patient taking differently: Take 300 mg by mouth 2 (two) times daily.), Disp: 90 capsule, Rfl: 3   nicotine (NICODERM CQ) 21 mg/24hr patch, Place 1 patch (21 mg total) onto the skin daily. (Patient taking differently: Place onto the skin daily as needed (nicotine dependence).), Disp: 28 patch, Rfl: 0   vitamin E 180 MG (400 UNITS) capsule, Take 400 Units by mouth daily with lunch., Disp: , Rfl:     mometasone (ELOCON) 0.1 % cream, Apply on itchy spots on legs  twice daily (Patient taking differently: Apply 1 Application topically 2 (two) times daily as needed (skin irritation/itching.).), Disp: 15 g, Rfl: 1  Social History   Tobacco Use  Smoking Status Every Day   Current packs/day: 0.25   Average packs/day: 0.3 packs/day for 25.0 years (6.3 ttl pk-yrs)   Types: Cigarettes  Smokeless Tobacco Never  Tobacco Comments   About 1-2 cigs daily. Pt uses nicotine patch.    No Known Allergies Objective:  There were no vitals filed for this visit. Body mass index is 22.17 kg/m. Constitutional Well developed. Well nourished.  Vascular Dorsalis pedis pulses palpable bilaterally. Posterior tibial pulses palpable bilaterally. Capillary refill normal to all digits.  No cyanosis or clubbing noted. Pedal hair growth normal.  Neurologic Normal speech. Oriented to person, place, and time. Epicritic sensation to light touch grossly present bilaterally.  Dermatologic Nails well groomed and normal in appearance. No open wounds. No skin lesions.  Orthopedic: Pain on palpation right first metatarsophalangeal joint.  Pain with range of motion noted of the first MPJ.  Crepitus noted to the first MPJ deep intra-articular pain noted.  Hammertoe contracture noted semiflexible in nature of second and third digit.  Joint contracture noted at the metatarsophalangeal joint of right second and third digit   Radiographs: 3 views of skeletally mature adult right foot  views : Arthritis noted the first metatarsophalangeal joint hammertoe contractures noted at second and third Assessment:   1. Arthritis of first metatarsophalangeal (MTP) joint of right foot   2. Hammertoe of right foot   3. Joint contracture of foot, right   4. Encounter for preoperative examination for general surgical procedure    Plan:  Patient was evaluated and treated and all questions answered.  Right first metatarsophalangeal  joint arthritis with hammertoe contracture of second and third digit with metatarsophalangeal joint contracture of second and third -All questions and concerns were discussed with the patient in extensive detail -Given that patient has failed all conservative care include shoe gear modification padding protecting offloading injection in the first MPJ patient will benefit from surgical correction of the foot deformities.  I discussed with the patient my preoperative intra postop plan in extensive detail she would benefit from right first metatarsophalangeal joint arthrodesis with arthroplasty of right second and third digit hammertoe contracture with capsulotomy of right second and third digit with fixation.  She states understanding would like to proceed with surgery despite all risks -Informed surgical risk consent was reviewed and read aloud to the patient.  I reviewed the films.  I have discussed my findings with the patient in great detail.  I have discussed all risks including but not limited to infection, stiffness, scarring, limp, disability, deformity, damage to blood vessels and nerves, numbness, poor healing, need for braces, arthritis, chronic pain, amputation, death.  All benefits and realistic expectations discussed in great detail.  I have made no promises as to the outcome.  I have provided realistic expectations.  I have offered the patient a 2nd opinion, which they have declined and assured me they preferred to proceed despite the risks   No follow-ups on file.

## 2023-12-29 ENCOUNTER — Ambulatory Visit: Payer: Self-pay | Admitting: Sports Medicine

## 2023-12-29 ENCOUNTER — Telehealth: Payer: Self-pay | Admitting: Urology

## 2023-12-29 NOTE — Telephone Encounter (Signed)
 Called patient, Patient has an appointment scheduled for 12/30/2023 to evaluate.

## 2023-12-29 NOTE — Telephone Encounter (Signed)
 DOS 02/02/24  CAPSULOTOMY MPJ RELEASE 2,3 LEFT --- 28270 HAMMERTOE REPAIR 2,3 LEFT --- 47829 HALLUX MPJ FUSION LEFT --- 56213   [employer] MEDICAID   SPOKE WITH NICOLE B. WITH [facility] FOR CPT CODES 08657 AND [phone] NO PRIOR AUTH IS REQUIRED.   CALL REF # NICOLE B. 12/15/23 AT 1:41 PM   RECEIVED FAX FROM [facility] STATING THAT CPT CODE 29528 HAS BEEN APPROVED, AUTH # [id number], GOOD FROM 02/02/24 - 05/02/24.

## 2023-12-29 NOTE — Telephone Encounter (Signed)
 Copied from CRM [id number]. Topic: Clinical - Red Word Triage >> Dec 29, 2023  9:32 AM Philippa Chester F wrote: Red Word that prompted transfer to Nurse Triage: Terrible Pain; On left side just below his ribs Reason for Disposition  [1] MODERATE pain (e.g., interferes with normal activities) AND [2] pain comes and goes (cramps) AND [3] present > 24 hours  (Exception: Pain with Vomiting or Diarrhea - see that Guideline.)  Answer Assessment - Initial Assessment Questions 1. LOCATION: "Where does it hurt?"      Having pain under ribs on left side.   It's getting worse.   I'm having trouble sleeping at night because it hurts.   2. RADIATION: "Does the pain shoot anywhere else?" (e.g., chest, back)     No  3. ONSET: "When did the pain begin?" (Minutes, hours or days ago)      A month ago it started 4. SUDDEN: "Gradual or sudden onset?"     Gradually getting worse 5. PATTERN "Does the pain come and go, or is it constant?"    - If it comes and goes: "How long does it last?" "Do you have pain now?"     (Note: Comes and goes means the pain is intermittent. It goes away completely between bouts.)    - If constant: "Is it getting better, staying the same, or getting worse?"      (Note: Constant means the pain never goes away completely; most serious pain is constant and gets worse.)      constant 6. SEVERITY: "How bad is the pain?"  (e.g., Scale 1-10; mild, moderate, or severe)    - MILD (1-3): Doesn't interfere with normal activities, abdomen soft and not tender to touch.     - MODERATE (4-7): Interferes with normal activities or awakens from sleep, abdomen tender to touch.     - SEVERE (8-10): Excruciating pain, doubled over, unable to do any normal activities.       5-7/10 7. RECURRENT SYMPTOM: "Have you ever had this type of stomach pain before?" If Yes, ask: "When was the last time?" and "What happened that time?"      No 8. CAUSE: "What do you think is causing the stomach pain?"     Don't know 9.  RELIEVING/AGGRAVATING FACTORS: "What makes it better or worse?" (e.g., antacids, bending or twisting motion, bowel movement)     Moving around and sleeping   Can't get comfortable 10. OTHER SYMPTOMS: "Do you have any other symptoms?" (e.g., back pain, diarrhea, fever, urination pain, vomiting)       Nothing else  Protocols used: Abdominal Pain - Male-A-AH  Chief Complaint: Pain under left ribs Symptoms: above Frequency: for last month getting worse Pertinent Negatives: Patient denies other symptoms Disposition: [] ED /[] Urgent Care (no appt availability in office) / [x] Appointment(In office/virtual)/ []  [facility]/ [] Home Care/ [] Refused Recommended Disposition /[] [place] [facility]/ []  Follow-up with PCP Additional Notes: appt made

## 2023-12-30 ENCOUNTER — Ambulatory Visit: Admitting: Family

## 2023-12-30 ENCOUNTER — Encounter: Payer: Self-pay | Admitting: Sports Medicine

## 2023-12-30 ENCOUNTER — Ambulatory Visit (INDEPENDENT_AMBULATORY_CARE_PROVIDER_SITE_OTHER): Admitting: Sports Medicine

## 2023-12-30 VITALS — BP 136/80 | HR 83 | Temp 97.5°F | Resp 16 | Ht 68.0 in | Wt 146.4 lb

## 2023-12-30 DIAGNOSIS — R0781 Pleurodynia: Secondary | ICD-10-CM | POA: Diagnosis not present

## 2023-12-30 DIAGNOSIS — L989 Disorder of the skin and subcutaneous tissue, unspecified: Secondary | ICD-10-CM

## 2023-12-30 MED ORDER — MOMETASONE FUROATE 0.1 % EX CREA
TOPICAL_CREAM | CUTANEOUS | 1 refills | Status: DC
Start: 2023-12-30 — End: 2024-06-17

## 2023-12-30 MED ORDER — BACLOFEN 5 MG PO TABS
5.0000 mg | ORAL_TABLET | Freq: Two times a day (BID) | ORAL | 0 refills | Status: DC | PRN
Start: 2023-12-30 — End: 2024-01-20

## 2023-12-30 NOTE — Progress Notes (Unsigned)
 Careteam: Patient Care Team: Mark Sheffield, MD as PCP - General (Internal Medicine)  PLACE OF SERVICE:  [facility] CLINIC  Advanced Directive information Does Patient Have a Medical Advance Directive?: No, Would patient like information on creating a medical advance directive?: No - Patient declined  No Known Allergies  Chief Complaint  Patient presents with   Acute Visit    Patient complains of pain in left rib that's been ongoing for 1 month. Patient has also noticed small spots on his hands and right leg.      Discussed the use of AI scribe software for clinical note transcription with the patient, who gave verbal consent to proceed.  History of Present Illness    He presents with left-sided rib pain and itchy skin lesions.  He has been experiencing left-sided rib pain for a few weeks. The pain is described as a 'knot' or 'jabbing' sensation, constant but worsening at times, and is positional, remaining in the same area without radiation. He rates the pain as 5 out of 10 on average, escalating to an 8 at its worst. Over-the-counter Tylenol has not provided relief. The pain does not affect his breathing, and there is no associated chest pain, nausea, vomiting, or urinary symptoms. He reports difficulty sleeping due to the pain, as he avoids lying on the affected side.  He also has itchy, raised skin lesions resembling insect bites,  These lesions are located on his legs and  hands. He has applied peroxide to the lesions due to severe itching but has not used any steroid creams.  He is currently taking gabapentin for chronic neck pain, which was previously evaluated with x-rays. The neck pain is described as burning and affects his sleep.             Review of Systems:  Review of Systems  Constitutional:  Negative for chills and fever.  HENT:  Negative for congestion and sore throat.   Eyes:  Negative for double vision.  Respiratory:  Negative for cough, sputum  production and shortness of breath.   Cardiovascular:  Negative for chest pain, palpitations and leg swelling.  Gastrointestinal:  Negative for abdominal pain, heartburn and nausea.  Genitourinary:  Negative for dysuria, frequency and hematuria.  Musculoskeletal:  Positive for back pain. Negative for falls and myalgias.  Neurological:  Negative for dizziness, sensory change and focal weakness.   Negative unless indicated in HPI.   Past Medical History:  Diagnosis Date   Arthritis    hands   Bilateral carpal tunnel syndrome 03/29/2020   Past Surgical History:  Procedure Laterality Date   ABCESS DRAINAGE     in the jaw area   ANTERIOR CERVICAL DECOMP/DISCECTOMY FUSION N/A 06/28/2020   Procedure: Anterior Cervical Decompression Fusion - Cervical three-four;  Surgeon: Donalee Citrin, MD;  Location: [facility] OR;  Service: Neurosurgery;  Laterality: N/A;   ANTERIOR CERVICAL DECOMP/DISCECTOMY FUSION N/A 11/05/2021   Procedure: Anterior Cervical Discectomy and Fusion Cervical Four-Five/Cervical Five-Six/Cervical Six-Seven with Hardware Removal Cervical Three-Four;  Surgeon: Donalee Citrin, MD;  Location: [facility] OR;  Service: Neurosurgery;  Laterality: N/A;   SPINE SURGERY  2021   ULNAR NERVE TRANSPOSITION Right 01/07/2023   Procedure: Right carpal tunnel release.  Right ulnar nerve release at the elbow.  Right scaphoid excision and 4 corner fusion with extensor pollicis longus transfer and posterior interosseous nerve neurectomy;  Surgeon: Dominica Severin, MD;  Location: [facility] OR;  Service: Orthopedics;  Laterality: Right;  2 hrs Block with IV Sedation  Social History:   reports that he has been smoking cigarettes. He has a 6.3 pack-year smoking history. He has never used smokeless tobacco. He reports current alcohol use of about 6.0 standard drinks of alcohol per week. He reports that he does not use drugs.  Family History  Problem Relation Age of Onset   Colon cancer Neg Hx    Colon polyps Neg Hx    Esophageal  cancer Neg Hx    Rectal cancer Neg Hx    Stomach cancer Neg Hx     Medications: Patient's Medications  New Prescriptions   BACLOFEN 5 MG TABS    Take 1 tablet (5 mg total) by mouth 2 (two) times daily as needed.   MOMETASONE (ELOCON) 0.1 % CREAM    Apply on itchy spots on legs  twice daily  Previous Medications   ACETAMINOPHEN (TYLENOL) 500 MG TABLET    Take 1,000 mg by mouth every 6 (six) hours as needed for moderate pain.   ASPIRIN EC 81 MG TABLET    Take 1 tablet (81 mg total) by mouth daily. Swallow whole.   ATORVASTATIN (LIPITOR) 10 MG TABLET    Take 1 tablet (10 mg total) by mouth daily.   CHOLECALCIFEROL (VITAMIN D3) 10 MCG (400 UNIT) TABLET    Take 400 Units by mouth daily.   GABAPENTIN (NEURONTIN) 300 MG CAPSULE    Take 1 capsule (300 mg total) by mouth 3 (three) times daily.   MULTIPLE VITAMIN (MULTIVITAMIN) CAPSULE    Take 1 capsule by mouth every other day.   NICOTINE (NICODERM CQ) 21 MG/24HR PATCH    Place 1 patch (21 mg total) onto the skin daily.   NICOTINE POLACRILEX (NICORETTE) 4 MG GUM    Take 1 each (4 mg total) by mouth as needed for smoking cessation.   SILDENAFIL (VIAGRA) 25 MG TABLET    Take 1 tablet (25 mg total) by mouth daily as needed for erectile dysfunction.   VITAMIN E 180 MG (400 UNITS) CAPSULE    Take 400 Units by mouth daily.  Modified Medications   No medications on file  Discontinued Medications   MISC. DEVICES (WRIST BRACE) MISC    2 Devices by Does not apply route daily. Right and left arm    Physical Exam: Vitals:   12/30/23 1335  BP: 136/80  Pulse: 83  Resp: 16  Temp: (!) 97.5 F (36.4 C)  SpO2: 97%  Weight: 146 lb 6.4 oz (66.4 kg)  Height: 5\' 8"  (1.727 m)   Body mass index is 22.26 kg/m. BP Readings from Last 3 Encounters:  12/30/23 136/80  11/04/23 110/60  06/25/23 138/88   Wt Readings from Last 3 Encounters:  12/30/23 146 lb 6.4 oz (66.4 kg)  12/05/23 145 lb 12.8 oz (66.1 kg)  11/04/23 145 lb 12.8 oz (66.1 kg)    Physical  Exam  Labs reviewed: Basic Metabolic Panel: No results for input(s): "NA", "K", "CL", "CO2", "GLUCOSE", "BUN", "CREATININE", "CALCIUM", "MG", "PHOS", "TSH" in the last 8760 hours. Liver Function Tests: No results for input(s): "AST", "ALT", "ALKPHOS", "BILITOT", "PROT", "ALBUMIN" in the last 8760 hours. No results for input(s): "LIPASE", "AMYLASE" in the last 8760 hours. No results for input(s): "AMMONIA" in the last 8760 hours. CBC: No results for input(s): "WBC", "NEUTROABS", "HGB", "HCT", "MCV", "PLT" in the last 8760 hours. Lipid Panel: No results for input(s): "CHOL", "HDL", "LDLCALC", "TRIG", "CHOLHDL", "LDLDIRECT" in the last 8760 hours. TSH: No results for input(s): "TSH" in the last 8760  hours. A1C: Lab Results  Component Value Date   HGBA1C 5.3 03/07/2020    Assessment and Plan    Left-sided rib pain Persistent left-sided rib pain, likely musculoskeletal or rib fracture.   - Order x-ray of the left ribs   - Advise taking Tylenol as needed for pain management. - Prescribe baclofen 5 mg tablets twice daily for muscle relaxation, caution about potential dizziness.  Pruritic skin lesions Raised, pruritic lesions resembling insect bites,  Will prescribe mometasone cream.

## 2023-12-31 ENCOUNTER — Encounter: Payer: Self-pay | Admitting: Sports Medicine

## 2024-01-20 ENCOUNTER — Encounter: Payer: Self-pay | Admitting: Sports Medicine

## 2024-01-20 ENCOUNTER — Ambulatory Visit (INDEPENDENT_AMBULATORY_CARE_PROVIDER_SITE_OTHER): Admitting: Sports Medicine

## 2024-01-20 VITALS — BP 120/88 | HR 77 | Temp 97.7°F | Resp 16 | Ht 68.0 in | Wt 148.4 lb

## 2024-01-20 DIAGNOSIS — M79671 Pain in right foot: Secondary | ICD-10-CM

## 2024-01-20 DIAGNOSIS — N182 Chronic kidney disease, stage 2 (mild): Secondary | ICD-10-CM

## 2024-01-20 NOTE — H&P (View-Only) (Signed)
 Careteam: Patient Care Team: Tye Gall, MD as PCP - General (Internal Medicine)  PLACE OF SERVICE:  [facility] CLINIC  Advanced Directive information    No Known Allergies  Chief Complaint  Patient presents with   Medical Clearance    Surgical clearance.Surgery is scheduled for April 14th for right foot.      Discussed the use of AI scribe software for clinical note transcription with the patient, who gave verbal consent to proceed.  History of Present Illness Mark Hampton is a 63 year old male who presents for pre-operative evaluation for  Rt halux valgus correction surgery   He is scheduled for bunion surgery on the 14th of this month due to persistent pain in the right foot. The pain is constant and exacerbated by a previously broken toe that healed improperly, causing issues with the adjacent toe and discomfort at the bottom of the foot.  He can walk about 25 feet before needing to stop and does not experience chest pain with minor activities such as taking out the trash. He feels short of breath when walking long distances. No chest pain, shortness of breath, palpitations, dizziness, or lightheadedness.  No history of heart problems, stroke, TIA, diabetes, asthma, or breathing problems. He does not exercise regularly but engages in minor activities around the house. He experiences occasional tingling or numbness in the feet, which he describes as occurring frequently. No history of coughing, congestion, pain with urination, or blood in the stool or urine. No feelings of tiredness, dizziness, or lightheadedness.    RCRI - 0 points  3.9% for major cardiac events    Review of Systems:  Review of Systems  Constitutional:  Negative for chills and fever.  HENT:  Negative for congestion and sore throat.   Eyes:  Negative for double vision.  Respiratory:  Negative for cough, sputum production and shortness of breath.   Cardiovascular:  Negative for chest pain,  palpitations and leg swelling.  Gastrointestinal:  Negative for abdominal pain, heartburn and nausea.  Genitourinary:  Negative for dysuria, frequency and hematuria.  Musculoskeletal:  Positive for joint pain. Negative for falls and myalgias.  Neurological:  Positive for sensory change. Negative for dizziness.   Negative unless indicated in HPI.   Past Medical History:  Diagnosis Date   Arthritis    hands   Bilateral carpal tunnel syndrome 03/29/2020   Past Surgical History:  Procedure Laterality Date   ABCESS DRAINAGE     in the jaw area   ANTERIOR CERVICAL DECOMP/DISCECTOMY FUSION N/A 06/28/2020   Procedure: Anterior Cervical Decompression Fusion - Cervical three-four;  Surgeon: Gearl Keens, MD;  Location: [facility] OR;  Service: Neurosurgery;  Laterality: N/A;   ANTERIOR CERVICAL DECOMP/DISCECTOMY FUSION N/A 11/05/2021   Procedure: Anterior Cervical Discectomy and Fusion Cervical Four-Five/Cervical Five-Six/Cervical Six-Seven with Hardware Removal Cervical Three-Four;  Surgeon: Gearl Keens, MD;  Location: [facility] OR;  Service: Neurosurgery;  Laterality: N/A;   SPINE SURGERY  2021   ULNAR NERVE TRANSPOSITION Right 01/07/2023   Procedure: Right carpal tunnel release.  Right ulnar nerve release at the elbow.  Right scaphoid excision and 4 corner fusion with extensor pollicis longus transfer and posterior interosseous nerve neurectomy;  Surgeon: Ronn Cohn, MD;  Location: [facility] OR;  Service: Orthopedics;  Laterality: Right;  2 hrs Block with IV Sedation   Social History:   reports that he has been smoking cigarettes. He has a 6.3 pack-year smoking history. He has never used smokeless tobacco. He reports current alcohol use of  about 6.0 standard drinks of alcohol per week. He reports that he does not use drugs.  Family History  Problem Relation Age of Onset   Colon cancer Neg Hx    Colon polyps Neg Hx    Esophageal cancer Neg Hx    Rectal cancer Neg Hx    Stomach cancer Neg Hx      Medications: Patient's Medications  New Prescriptions   No medications on file  Previous Medications   ACETAMINOPHEN (TYLENOL) 500 MG TABLET    Take 1,000 mg by mouth every 6 (six) hours as needed for moderate pain.   ASPIRIN EC 81 MG TABLET    Take 1 tablet (81 mg total) by mouth daily. Swallow whole.   ATORVASTATIN (LIPITOR) 10 MG TABLET    Take 1 tablet (10 mg total) by mouth daily.   BACLOFEN (LIORESAL) 10 MG TABLET    Take 5 mg by mouth 2 (two) times daily as needed.   CHOLECALCIFEROL (VITAMIN D3) 10 MCG (400 UNIT) TABLET    Take 400 Units by mouth every other day.   GABAPENTIN (NEURONTIN) 300 MG CAPSULE    Take 1 capsule (300 mg total) by mouth 3 (three) times daily.   MOMETASONE (ELOCON) 0.1 % CREAM    Apply on itchy spots on legs  twice daily   MULTIPLE VITAMIN (MULTIVITAMIN) CAPSULE    Take 1 capsule by mouth every other day.   NICOTINE (NICODERM CQ) 21 MG/24HR PATCH    Place 1 patch (21 mg total) onto the skin daily.   NICOTINE POLACRILEX (NICORETTE) 4 MG GUM    Take 1 each (4 mg total) by mouth as needed for smoking cessation.   SILDENAFIL (VIAGRA) 25 MG TABLET    Take 1 tablet (25 mg total) by mouth daily as needed for erectile dysfunction.   VITAMIN E 180 MG (400 UNITS) CAPSULE    Take 400 Units by mouth every other day.  Modified Medications   No medications on file  Discontinued Medications   No medications on file    Physical Exam: Vitals:   01/20/24 1415  BP: 120/88  Pulse: 77  Resp: 16  Temp: 97.7 F (36.5 C)  SpO2: 97%  Weight: 148 lb 6.4 oz (67.3 kg)  Height: 5\' 8"  (1.727 m)   Body mass index is 22.56 kg/m. BP Readings from Last 3 Encounters:  01/20/24 120/88  12/30/23 136/80  11/04/23 110/60   Wt Readings from Last 3 Encounters:  01/20/24 148 lb 6.4 oz (67.3 kg)  12/30/23 146 lb 6.4 oz (66.4 kg)  12/05/23 145 lb 12.8 oz (66.1 kg)    Physical Exam Constitutional:      Appearance: Normal appearance.  HENT:     Head: Normocephalic and  atraumatic.  Cardiovascular:     Rate and Rhythm: Normal rate and regular rhythm.  Pulmonary:     Effort: Pulmonary effort is normal. No respiratory distress.     Breath sounds: Normal breath sounds. No wheezing.  Abdominal:     General: Bowel sounds are normal. There is no distension.     Tenderness: There is no abdominal tenderness. There is no guarding or rebound.     Comments:    Musculoskeletal:        General: No swelling or tenderness.     Comments: Rt foot  Hallux valgus Mild tenderness Pedal pulse intact  Neurological:     Mental Status: He is alert. Mental status is at baseline.     Motor: No  weakness.     Labs reviewed: Basic Metabolic Panel: No results for input(s): "NA", "K", "CL", "CO2", "GLUCOSE", "BUN", "CREATININE", "CALCIUM", "MG", "PHOS", "TSH" in the last 8760 hours. Liver Function Tests: No results for input(s): "AST", "ALT", "ALKPHOS", "BILITOT", "PROT", "ALBUMIN" in the last 8760 hours. No results for input(s): "LIPASE", "AMYLASE" in the last 8760 hours. No results for input(s): "AMMONIA" in the last 8760 hours. CBC: No results for input(s): "WBC", "NEUTROABS", "HGB", "HCT", "MCV", "PLT" in the last 8760 hours. Lipid Panel: No results for input(s): "CHOL", "HDL", "LDLCALC", "TRIG", "CHOLHDL", "LDLDIRECT" in the last 8760 hours. TSH: No results for input(s): "TSH" in the last 8760 hours. A1C: Lab Results  Component Value Date   HGBA1C 5.3 03/07/2020    Assessment and Plan Assessment & Plan  1. Right foot pain (Primary)  Pt is low risk going for low risk procedure Forms signed and gave to nurse to fax to podiatry  - CBC (no diff) - Basic Metabolic Panel with eGFR  2. Stage 2 chronic kidney disease   - CBC (no diff) - Basic Metabolic Panel with eGFR

## 2024-01-20 NOTE — Progress Notes (Unsigned)
 Careteam: Patient Care Team: Venita Sheffield, MD as PCP - General (Internal Medicine)  PLACE OF SERVICE:  [facility] CLINIC  Advanced Directive information    No Known Allergies  Chief Complaint  Patient presents with   Medical Clearance    Surgical clearance.Surgery is scheduled for April 14th for right foot.      Discussed the use of AI scribe software for clinical note transcription with the patient, who gave verbal consent to proceed.  History of Present Illness Mark Hampton is a 63 year old male who presents for pre-operative evaluation for  Rt halux valgus correction surgery   He is scheduled for bunion surgery on the 14th of this month due to persistent pain in the right foot. The pain is constant and exacerbated by a previously broken toe that healed improperly, causing issues with the adjacent toe and discomfort at the bottom of the foot.  He can walk about 25 feet before needing to stop and does not experience chest pain with minor activities such as taking out the trash. He feels short of breath when walking long distances. No chest pain, shortness of breath, palpitations, dizziness, or lightheadedness.  No history of heart problems, stroke, TIA, diabetes, asthma, or breathing problems. He does not exercise regularly but engages in minor activities around the house. He experiences occasional tingling or numbness in the feet, which he describes as occurring frequently. No history of coughing, congestion, pain with urination, or blood in the stool or urine. No feelings of tiredness, dizziness, or lightheadedness.    RCRI - 0 points  3.9% for major cardiac events    Review of Systems:  Review of Systems  Constitutional:  Negative for chills and fever.  HENT:  Negative for congestion and sore throat.   Eyes:  Negative for double vision.  Respiratory:  Negative for cough, sputum production and shortness of breath.   Cardiovascular:  Negative for chest pain,  palpitations and leg swelling.  Gastrointestinal:  Negative for abdominal pain, heartburn and nausea.  Genitourinary:  Negative for dysuria, frequency and hematuria.  Musculoskeletal:  Positive for joint pain. Negative for falls and myalgias.  Neurological:  Positive for sensory change. Negative for dizziness.   Negative unless indicated in HPI.   Past Medical History:  Diagnosis Date   Arthritis    hands   Bilateral carpal tunnel syndrome 03/29/2020   Past Surgical History:  Procedure Laterality Date   ABCESS DRAINAGE     in the jaw area   ANTERIOR CERVICAL DECOMP/DISCECTOMY FUSION N/A 06/28/2020   Procedure: Anterior Cervical Decompression Fusion - Cervical three-four;  Surgeon: Donalee Citrin, MD;  Location: [facility] OR;  Service: Neurosurgery;  Laterality: N/A;   ANTERIOR CERVICAL DECOMP/DISCECTOMY FUSION N/A 11/05/2021   Procedure: Anterior Cervical Discectomy and Fusion Cervical Four-Five/Cervical Five-Six/Cervical Six-Seven with Hardware Removal Cervical Three-Four;  Surgeon: Donalee Citrin, MD;  Location: [facility] OR;  Service: Neurosurgery;  Laterality: N/A;   SPINE SURGERY  2021   ULNAR NERVE TRANSPOSITION Right 01/07/2023   Procedure: Right carpal tunnel release.  Right ulnar nerve release at the elbow.  Right scaphoid excision and 4 corner fusion with extensor pollicis longus transfer and posterior interosseous nerve neurectomy;  Surgeon: Dominica Severin, MD;  Location: [facility] OR;  Service: Orthopedics;  Laterality: Right;  2 hrs Block with IV Sedation   Social History:   reports that he has been smoking cigarettes. He has a 6.3 pack-year smoking history. He has never used smokeless tobacco. He reports current alcohol use of  about 6.0 standard drinks of alcohol per week. He reports that he does not use drugs.  Family History  Problem Relation Age of Onset   Colon cancer Neg Hx    Colon polyps Neg Hx    Esophageal cancer Neg Hx    Rectal cancer Neg Hx    Stomach cancer Neg Hx      Medications: Patient's Medications  New Prescriptions   No medications on file  Previous Medications   ACETAMINOPHEN (TYLENOL) 500 MG TABLET    Take 1,000 mg by mouth every 6 (six) hours as needed for moderate pain.   ASPIRIN EC 81 MG TABLET    Take 1 tablet (81 mg total) by mouth daily. Swallow whole.   ATORVASTATIN (LIPITOR) 10 MG TABLET    Take 1 tablet (10 mg total) by mouth daily.   BACLOFEN (LIORESAL) 10 MG TABLET    Take 5 mg by mouth 2 (two) times daily as needed.   CHOLECALCIFEROL (VITAMIN D3) 10 MCG (400 UNIT) TABLET    Take 400 Units by mouth every other day.   GABAPENTIN (NEURONTIN) 300 MG CAPSULE    Take 1 capsule (300 mg total) by mouth 3 (three) times daily.   MOMETASONE (ELOCON) 0.1 % CREAM    Apply on itchy spots on legs  twice daily   MULTIPLE VITAMIN (MULTIVITAMIN) CAPSULE    Take 1 capsule by mouth every other day.   NICOTINE (NICODERM CQ) 21 MG/24HR PATCH    Place 1 patch (21 mg total) onto the skin daily.   NICOTINE POLACRILEX (NICORETTE) 4 MG GUM    Take 1 each (4 mg total) by mouth as needed for smoking cessation.   SILDENAFIL (VIAGRA) 25 MG TABLET    Take 1 tablet (25 mg total) by mouth daily as needed for erectile dysfunction.   VITAMIN E 180 MG (400 UNITS) CAPSULE    Take 400 Units by mouth every other day.  Modified Medications   No medications on file  Discontinued Medications   No medications on file    Physical Exam: Vitals:   01/20/24 1415  BP: 120/88  Pulse: 77  Resp: 16  Temp: 97.7 F (36.5 C)  SpO2: 97%  Weight: 148 lb 6.4 oz (67.3 kg)  Height: 5\' 8"  (1.727 m)   Body mass index is 22.56 kg/m. BP Readings from Last 3 Encounters:  01/20/24 120/88  12/30/23 136/80  11/04/23 110/60   Wt Readings from Last 3 Encounters:  01/20/24 148 lb 6.4 oz (67.3 kg)  12/30/23 146 lb 6.4 oz (66.4 kg)  12/05/23 145 lb 12.8 oz (66.1 kg)    Physical Exam Constitutional:      Appearance: Normal appearance.  HENT:     Head: Normocephalic and  atraumatic.  Cardiovascular:     Rate and Rhythm: Normal rate and regular rhythm.  Pulmonary:     Effort: Pulmonary effort is normal. No respiratory distress.     Breath sounds: Normal breath sounds. No wheezing.  Abdominal:     General: Bowel sounds are normal. There is no distension.     Tenderness: There is no abdominal tenderness. There is no guarding or rebound.     Comments:    Musculoskeletal:        General: No swelling or tenderness.     Comments: Rt foot  Hallux valgus Mild tenderness Pedal pulse intact  Neurological:     Mental Status: He is alert. Mental status is at baseline.     Motor: No  weakness.     Labs reviewed: Basic Metabolic Panel: No results for input(s): "NA", "K", "CL", "CO2", "GLUCOSE", "BUN", "CREATININE", "CALCIUM", "MG", "PHOS", "TSH" in the last 8760 hours. Liver Function Tests: No results for input(s): "AST", "ALT", "ALKPHOS", "BILITOT", "PROT", "ALBUMIN" in the last 8760 hours. No results for input(s): "LIPASE", "AMYLASE" in the last 8760 hours. No results for input(s): "AMMONIA" in the last 8760 hours. CBC: No results for input(s): "WBC", "NEUTROABS", "HGB", "HCT", "MCV", "PLT" in the last 8760 hours. Lipid Panel: No results for input(s): "CHOL", "HDL", "LDLCALC", "TRIG", "CHOLHDL", "LDLDIRECT" in the last 8760 hours. TSH: No results for input(s): "TSH" in the last 8760 hours. A1C: Lab Results  Component Value Date   HGBA1C 5.3 03/07/2020    Assessment and Plan Assessment & Plan  1. Right foot pain (Primary)  Pt is low risk going for low risk procedure Forms signed and gave to nurse to fax to podiatry  - CBC (no diff) - Basic Metabolic Panel with eGFR  2. Stage 2 chronic kidney disease   - CBC (no diff) - Basic Metabolic Panel with eGFR

## 2024-01-21 ENCOUNTER — Telehealth: Payer: Self-pay

## 2024-01-21 ENCOUNTER — Encounter: Payer: Self-pay | Admitting: Sports Medicine

## 2024-01-21 LAB — BASIC METABOLIC PANEL WITHOUT GFR
BUN: 12 mg/dL (ref 7–25)
CO2: 24 mmol/L (ref 20–32)
Calcium: 9.4 mg/dL (ref 8.6–10.3)
Chloride: 108 mmol/L (ref 98–110)
Creat: 1.04 mg/dL (ref 0.70–1.35)
Glucose, Bld: 84 mg/dL (ref 65–139)
Potassium: 4.5 mmol/L (ref 3.5–5.3)
Sodium: 138 mmol/L (ref 135–146)

## 2024-01-21 LAB — CBC
HCT: 43.3 % (ref 38.5–50.0)
Hemoglobin: 14.6 g/dL (ref 13.2–17.1)
MCH: 32.2 pg (ref 27.0–33.0)
MCHC: 33.7 g/dL (ref 32.0–36.0)
MCV: 95.6 fL (ref 80.0–100.0)
MPV: 10 fL (ref 7.5–12.5)
Platelets: 240 10*3/uL (ref 140–400)
RBC: 4.53 10*6/uL (ref 4.20–5.80)
RDW: 13.2 % (ref 11.0–15.0)
WBC: 6.4 10*3/uL (ref 3.8–10.8)

## 2024-01-21 NOTE — Telephone Encounter (Signed)
 Venita Sheffield, MD gave me the completed surgical clearance form for [facility]. I printed and attached last ov note and gave to the administrative staff for further processing (fax to [phone])

## 2024-01-26 ENCOUNTER — Encounter: Payer: Self-pay | Admitting: Podiatry

## 2024-01-26 ENCOUNTER — Encounter
Admission: RE | Admit: 2024-01-26 | Discharge: 2024-01-26 | Disposition: A | Discharge status: Home / Self Care | Payer: Medicaid Other | Source: Ambulatory Visit | Attending: Podiatry | Admitting: Podiatry

## 2024-01-26 ENCOUNTER — Other Ambulatory Visit: Payer: Self-pay

## 2024-01-26 HISTORY — DX: Other hammer toe(s) (acquired), right foot: M20.41

## 2024-01-26 HISTORY — DX: Spinal stenosis, cervical region: M48.02

## 2024-01-26 HISTORY — DX: Radiculopathy, cervical region: M54.12

## 2024-01-26 NOTE — Patient Instructions (Addendum)
 Your procedure is scheduled on: Monday, April 14 Report to the Registration Desk on the 1st floor of the [facility] To find out your arrival time, please call [phone] between 1PM - 3PM on: Friday, April 11 If your arrival time is 6:00 am, do not arrive before that time as the [facility] entrance doors do not open until 6:00 am.  REMEMBER: Instructions that are not followed completely may result in serious medical risk, up to and including death; or upon the discretion of your surgeon and anesthesiologist your surgery may need to be rescheduled.  Do not eat food after midnight the night before surgery.  No gum chewing or hard candies.  You may however, drink CLEAR liquids up to 2 hours before you are scheduled to arrive for your surgery. Do not drink anything within 2 hours of your scheduled arrival time.  Clear liquids include: - water  - apple juice without pulp - gatorade (not RED colors) - black coffee or tea (Do NOT add milk or creamers to the coffee or tea) Do NOT drink anything that is not on this list.  One week prior to surgery: starting April 7 Stop Anti-inflammatories (NSAIDS) such as Advil, Aleve, Ibuprofen, Motrin, Naproxen, Naprosyn and Aspirin based products such as Excedrin, Goody's Powder, BC Powder. Stop ANY OVER THE COUNTER supplements until after surgery. Stop vitamin D, E.  You may however, continue to take Tylenol if needed for pain up until the day of surgery.  Continue taking all of your other prescription medications up until the day of surgery.  ON THE DAY OF SURGERY ONLY TAKE THESE MEDICATIONS WITH SIPS OF WATER:  Atorvastatin (Lipitor) Gabapentin  No Alcohol for 24 hours before or after surgery.  No Smoking including e-cigarettes for 24 hours before surgery.  No chewable tobacco products for at least 6 hours before surgery.  No nicotine patches on the day of surgery.  Do not use any "recreational" drugs for at least a week (preferably 2  weeks) before your surgery.  Please be advised that the combination of cocaine and anesthesia may have negative outcomes, up to and including death. If you test positive for cocaine, your surgery will be cancelled.  On the morning of surgery brush your teeth with toothpaste and water, you may rinse your mouth with mouthwash if you wish. Do not swallow any toothpaste or mouthwash.  Use CHG Soap as directed on instruction sheet.  Do not wear jewelry, make-up, hairpins, clips or nail polish.  For welded (permanent) jewelry: bracelets, anklets, waist bands, etc.  Please have this removed prior to surgery.  If it is not removed, there is a chance that hospital personnel will need to cut it off on the day of surgery.  Do not wear lotions, powders, or perfumes.   Do not shave body hair from the neck down 48 hours before surgery.  Contact lenses, hearing aids and dentures may not be worn into surgery.  Do not bring valuables to the hospital. [facility] is not responsible for any missing/lost belongings or valuables.   Notify your doctor if there is any change in your medical condition (cold, fever, infection).  Wear comfortable clothing (specific to your surgery type) to the hospital.  After surgery, you can help prevent lung complications by doing breathing exercises.  Take deep breaths and cough every 1-2 hours.   If you are being discharged the day of surgery, you will not be allowed to drive home. You will need a responsible  individual to drive you home and stay with you for 24 hours after surgery.   If you are taking public transportation, you will need to have a responsible individual with you.  Please call the Pre-admissions Testing Dept. at [phone] if you have any questions about these instructions.  Surgery Visitation Policy:  Patients having surgery or a procedure may have two visitors.  Children under the age of 70 must have an adult with them who is not the  patient.        Preparing for Surgery with CHLORHEXIDINE GLUCONATE (CHG) Soap  Chlorhexidine Gluconate (CHG) Soap  o An antiseptic cleaner that kills germs and bonds with the skin to continue killing germs even after washing  o Used for showering the night before surgery and morning of surgery  Before surgery, you can play an important role by reducing the number of germs on your skin.  CHG (Chlorhexidine gluconate) soap is an antiseptic cleanser which kills germs and bonds with the skin to continue killing germs even after washing.  Please do not use if you have an allergy to CHG or antibacterial soaps. If your skin becomes reddened/irritated stop using the CHG.  1. Shower the NIGHT BEFORE SURGERY and the MORNING OF SURGERY with CHG soap.  2. If you choose to wash your hair, wash your hair first as usual with your normal shampoo.  3. After shampooing, rinse your hair and body thoroughly to remove the shampoo.  4. Use CHG as you would any other liquid soap. You can apply CHG directly to the skin and wash gently with a scrungie or a clean washcloth.  5. Apply the CHG soap to your body only from the neck down. Do not use on open wounds or open sores. Avoid contact with your eyes, ears, mouth, and genitals (private parts). Wash face and genitals (private parts) with your normal soap.  6. Wash thoroughly, paying special attention to the area where your surgery will be performed.  7. Thoroughly rinse your body with warm water.  8. Do not shower/wash with your normal soap after using and rinsing off the CHG soap.  9. Pat yourself dry with a clean towel.  10. Wear clean pajamas to bed the night before surgery.  12. Place clean sheets on your bed the night of your first shower and do not sleep with pets.  13. Shower again with the CHG soap on the day of surgery prior to arriving at the hospital.  14. Do not apply any deodorants/lotions/powders.  15. Please wear clean clothes to  the hospital.

## 2024-02-01 MED ORDER — CEFAZOLIN SODIUM-DEXTROSE 2-4 GM/100ML-% IV SOLN
2.0000 g | Freq: Once | INTRAVENOUS | Status: AC
  Administered 2024-02-02: 2 g via INTRAVENOUS

## 2024-02-01 MED ORDER — CHLORHEXIDINE GLUCONATE 0.12 % MT SOLN
15.0000 mL | Freq: Once | OROMUCOSAL | Status: AC
  Administered 2024-02-02: 15 mL via OROMUCOSAL

## 2024-02-01 MED ORDER — ORAL CARE MOUTH RINSE: 15.0000 mL | Freq: Once | OROMUCOSAL | Status: AC

## 2024-02-01 MED ORDER — LACTATED RINGERS IV SOLN: INTRAVENOUS | Status: DC

## 2024-02-02 ENCOUNTER — Ambulatory Visit: Admitting: Anesthesiology

## 2024-02-02 ENCOUNTER — Other Ambulatory Visit: Payer: Self-pay | Admitting: Podiatry

## 2024-02-02 ENCOUNTER — Other Ambulatory Visit: Payer: Self-pay

## 2024-02-02 ENCOUNTER — Ambulatory Visit

## 2024-02-02 ENCOUNTER — Encounter
Admission: RE | Disposition: A | Discharge status: Home / Self Care | Payer: Self-pay | Source: Home / Self Care | Attending: Podiatry

## 2024-02-02 ENCOUNTER — Ambulatory Visit
Admission: RE | Admit: 2024-02-02 | Discharge: 2024-02-02 | Disposition: A | Discharge status: Home / Self Care | Payer: Medicaid Other | Attending: Podiatry | Admitting: Podiatry

## 2024-02-02 ENCOUNTER — Encounter: Payer: Self-pay | Admitting: Podiatry

## 2024-02-02 DIAGNOSIS — N182 Chronic kidney disease, stage 2 (mild): Secondary | ICD-10-CM | POA: Insufficient documentation

## 2024-02-02 DIAGNOSIS — M2041 Other hammer toe(s) (acquired), right foot: Secondary | ICD-10-CM | POA: Diagnosis not present

## 2024-02-02 DIAGNOSIS — M21611 Bunion of right foot: Secondary | ICD-10-CM | POA: Diagnosis present

## 2024-02-02 DIAGNOSIS — F17210 Nicotine dependence, cigarettes, uncomplicated: Secondary | ICD-10-CM | POA: Diagnosis not present

## 2024-02-02 DIAGNOSIS — M19071 Primary osteoarthritis, right ankle and foot: Secondary | ICD-10-CM | POA: Diagnosis not present

## 2024-02-02 DIAGNOSIS — M7751 Other enthesopathy of right foot: Secondary | ICD-10-CM | POA: Diagnosis not present

## 2024-02-02 HISTORY — PX: CAPSULOTOMY METATARSOPHALANGEAL: SHX6614

## 2024-02-02 HISTORY — PX: HAMMER TOE SURGERY: SHX385

## 2024-02-02 HISTORY — PX: HALLUX FUSION: SHX6621

## 2024-02-02 SURGERY — FUSION, JOINT, GREAT TOE
Anesthesia: General | Site: Toe | Laterality: Right

## 2024-02-02 MED ORDER — BUPIVACAINE LIPOSOME 1.3 % IJ SUSP
INTRAMUSCULAR | Status: DC | PRN
  Administered 2024-02-02: 10 mL via PERINEURAL

## 2024-02-02 MED ORDER — EPHEDRINE SULFATE-NACL 50-0.9 MG/10ML-% IV SOSY
PREFILLED_SYRINGE | INTRAVENOUS | Status: DC | PRN
Start: 2024-02-02 — End: 2024-02-02
  Administered 2024-02-02: 5 mg via INTRAVENOUS
  Administered 2024-02-02: 20 mg via INTRAVENOUS
  Administered 2024-02-02: 10 mg via INTRAVENOUS

## 2024-02-02 MED ORDER — FENTANYL CITRATE (PF) 100 MCG/2ML IJ SOLN
INTRAMUSCULAR | Status: DC | PRN
  Administered 2024-02-02: 50 ug via INTRAVENOUS

## 2024-02-02 MED ORDER — SEVOFLURANE IN SOLN
RESPIRATORY_TRACT | Status: AC
  Filled 2024-02-02: qty 250

## 2024-02-02 MED ORDER — ONDANSETRON HCL 4 MG/2ML IJ SOLN
INTRAMUSCULAR | Status: DC | PRN
  Administered 2024-02-02: 4 mg via INTRAVENOUS

## 2024-02-02 MED ORDER — CEFAZOLIN SODIUM-DEXTROSE 2-4 GM/100ML-% IV SOLN
INTRAVENOUS | Status: AC
  Filled 2024-02-02: qty 100

## 2024-02-02 MED ORDER — ACETAMINOPHEN 10 MG/ML IV SOLN
INTRAVENOUS | Status: AC
  Filled 2024-02-02: qty 100

## 2024-02-02 MED ORDER — KETAMINE HCL 50 MG/5ML IJ SOSY
PREFILLED_SYRINGE | INTRAMUSCULAR | Status: DC | PRN
Start: 2024-02-02 — End: 2024-02-02
  Administered 2024-02-02: 20 mg via INTRAVENOUS

## 2024-02-02 MED ORDER — DEXAMETHASONE SODIUM PHOSPHATE 10 MG/ML IJ SOLN
INTRAMUSCULAR | Status: AC
  Filled 2024-02-02: qty 1

## 2024-02-02 MED ORDER — FENTANYL CITRATE (PF) 100 MCG/2ML IJ SOLN
INTRAMUSCULAR | Status: AC
  Filled 2024-02-02: qty 2

## 2024-02-02 MED ORDER — BUPIVACAINE HCL (PF) 0.5 % IJ SOLN
INTRAMUSCULAR | Status: AC
  Filled 2024-02-02: qty 30

## 2024-02-02 MED ORDER — PROPOFOL 10 MG/ML IV BOLUS
INTRAVENOUS | Status: AC
  Filled 2024-02-02: qty 20

## 2024-02-02 MED ORDER — FENTANYL CITRATE (PF) 100 MCG/2ML IJ SOLN
25.0000 ug | INTRAMUSCULAR | Status: DC | PRN
  Administered 2024-02-02 (×2): 50 ug via INTRAVENOUS

## 2024-02-02 MED ORDER — PROPOFOL 1000 MG/100ML IV EMUL
INTRAVENOUS | Status: AC
  Filled 2024-02-02: qty 100

## 2024-02-02 MED ORDER — DROPERIDOL 2.5 MG/ML IJ SOLN: 0.6250 mg | Freq: Once | INTRAMUSCULAR | Status: DC | PRN

## 2024-02-02 MED ORDER — BUPIVACAINE LIPOSOME 1.3 % IJ SUSP
INTRAMUSCULAR | Status: AC
  Filled 2024-02-02: qty 10

## 2024-02-02 MED ORDER — PROPOFOL 10 MG/ML IV BOLUS
INTRAVENOUS | Status: DC | PRN
  Administered 2024-02-02: 50 ug/kg/min via INTRAVENOUS
  Administered 2024-02-02: 10 mg via INTRAVENOUS

## 2024-02-02 MED ORDER — ONDANSETRON HCL 4 MG/2ML IJ SOLN
INTRAMUSCULAR | Status: AC
  Filled 2024-02-02: qty 2

## 2024-02-02 MED ORDER — IBUPROFEN 800 MG PO TABS: 800.0000 mg | ORAL_TABLET | Freq: Four times a day (QID) | ORAL | 1 refills | Status: DC | PRN

## 2024-02-02 MED ORDER — CHLORHEXIDINE GLUCONATE 0.12 % MT SOLN
OROMUCOSAL | Status: AC
  Filled 2024-02-02: qty 15

## 2024-02-02 MED ORDER — BUPIVACAINE HCL (PF) 0.5 % IJ SOLN
INTRAMUSCULAR | Status: DC | PRN
  Administered 2024-02-02: 10 mL via PERINEURAL

## 2024-02-02 MED ORDER — KETAMINE HCL 50 MG/5ML IJ SOSY
PREFILLED_SYRINGE | INTRAMUSCULAR | Status: AC
  Filled 2024-02-02: qty 5

## 2024-02-02 MED ORDER — EPHEDRINE 5 MG/ML INJ
INTRAVENOUS | Status: AC
  Filled 2024-02-02: qty 5

## 2024-02-02 MED ORDER — 0.9 % SODIUM CHLORIDE (POUR BTL) OPTIME
TOPICAL | Status: DC | PRN
  Administered 2024-02-02: 500 mL

## 2024-02-02 MED ORDER — LIDOCAINE HCL (PF) 1 % IJ SOLN
INTRAMUSCULAR | Status: AC
  Filled 2024-02-02: qty 5

## 2024-02-02 MED ORDER — OXYCODONE HCL 5 MG PO TABS
ORAL_TABLET | ORAL | Status: AC
  Filled 2024-02-02: qty 1

## 2024-02-02 MED ORDER — LIDOCAINE HCL (PF) 2 % IJ SOLN
INTRAMUSCULAR | Status: AC
  Filled 2024-02-02: qty 5

## 2024-02-02 MED ORDER — ACETAMINOPHEN 10 MG/ML IV SOLN
INTRAVENOUS | Status: DC | PRN
Start: 2024-02-02 — End: 2024-02-02
  Administered 2024-02-02: 1000 mg via INTRAVENOUS

## 2024-02-02 MED ORDER — LIDOCAINE HCL (CARDIAC) PF 100 MG/5ML IV SOSY
PREFILLED_SYRINGE | INTRAVENOUS | Status: DC | PRN
Start: 2024-02-02 — End: 2024-02-02
  Administered 2024-02-02: 80 mg via INTRAVENOUS

## 2024-02-02 MED ORDER — OXYCODONE HCL 5 MG PO TABS
5.0000 mg | ORAL_TABLET | Freq: Once | ORAL | Status: AC
  Administered 2024-02-02: 5 mg via ORAL

## 2024-02-02 MED ORDER — PHENYLEPHRINE 80 MCG/ML (10ML) SYRINGE FOR IV PUSH (FOR BLOOD PRESSURE SUPPORT)
PREFILLED_SYRINGE | INTRAVENOUS | Status: DC | PRN
  Administered 2024-02-02 (×4): 80 ug via INTRAVENOUS

## 2024-02-02 MED ORDER — PHENYLEPHRINE HCL-NACL 20-0.9 MG/250ML-% IV SOLN
INTRAVENOUS | Status: AC
  Filled 2024-02-02: qty 250

## 2024-02-02 MED ORDER — MIDAZOLAM HCL 2 MG/2ML IJ SOLN
INTRAMUSCULAR | Status: DC | PRN
  Administered 2024-02-02 (×2): 1 mg via INTRAVENOUS

## 2024-02-02 MED ORDER — LIDOCAINE HCL (PF) 1 % IJ SOLN
INTRAMUSCULAR | Status: DC | PRN
  Administered 2024-02-02: 4 mL via SUBCUTANEOUS

## 2024-02-02 MED ORDER — MIDAZOLAM HCL 2 MG/2ML IJ SOLN
INTRAMUSCULAR | Status: AC
  Filled 2024-02-02: qty 2

## 2024-02-02 MED ORDER — BUPIVACAINE HCL (PF) 0.5 % IJ SOLN
INTRAMUSCULAR | Status: AC
  Filled 2024-02-02: qty 10

## 2024-02-02 MED ORDER — OXYCODONE-ACETAMINOPHEN 5-325 MG PO TABS: 1.0000 | ORAL_TABLET | ORAL | 0 refills | Status: DC | PRN

## 2024-02-02 SURGICAL SUPPLY — 56 items
BIT DRILL LEOS 2.4 (BIT) IMPLANT
BIT DRILL LEOS SN 2.0 (DRILL) IMPLANT
BIT DRILL MINI AT AC (DRILL) IMPLANT
BLADE OSC/SAGITTAL MD 5.5X18 (BLADE) IMPLANT
BLADE SURG 15 STRL LF DISP TIS (BLADE) ×6 IMPLANT
BLADE SURG MINI STRL (BLADE) IMPLANT
BNDG ELASTIC 4X5.8 VLCR NS LF (GAUZE/BANDAGES/DRESSINGS) ×3 IMPLANT
BNDG ESMARCH 4X12 STRL LF (GAUZE/BANDAGES/DRESSINGS) ×3 IMPLANT
BNDG GAUZE DERMACEA FLUFF 4 (GAUZE/BANDAGES/DRESSINGS) ×3 IMPLANT
CHLORAPREP W/TINT 26 (MISCELLANEOUS) ×3 IMPLANT
COVER PIN YLW 0.028-062 (MISCELLANEOUS) IMPLANT
CUFF TOURN SGL QUICK 12 (TOURNIQUET CUFF) IMPLANT
CUFF TOURN SGL QUICK 18X4 (TOURNIQUET CUFF) IMPLANT
DRILL LEOS SN 2.0 (DRILL) ×3 IMPLANT
DRILL MINI AT AC (DRILL) ×3 IMPLANT
ELECT BLADE 4.0 EZ CLEAN MEGAD (MISCELLANEOUS) ×3 IMPLANT
ELECT REM PT RETURN 9FT ADLT (ELECTROSURGICAL) ×3 IMPLANT
ELECTRODE BLDE 4.0 EZ CLN MEGD (MISCELLANEOUS) ×3 IMPLANT
ELECTRODE REM PT RTRN 9FT ADLT (ELECTROSURGICAL) ×3 IMPLANT
GAUZE SPONGE 4X4 12PLY STRL (GAUZE/BANDAGES/DRESSINGS) ×3 IMPLANT
GAUZE XEROFORM 1X8 LF (GAUZE/BANDAGES/DRESSINGS) ×3 IMPLANT
GLOVE BIO SURGEON STRL SZ7.5 (GLOVE) ×3 IMPLANT
GLOVE BIOGEL PI IND STRL 7.0 (GLOVE) ×3 IMPLANT
GLOVE SURG SYN 7.0 (GLOVE) ×3 IMPLANT
GLOVE SURG SYN 7.0 PF PI (GLOVE) ×3 IMPLANT
GOWN STRL REUS W/ TWL XL LVL3 (GOWN DISPOSABLE) ×6 IMPLANT
KIT TURNOVER KIT A (KITS) ×3 IMPLANT
KWIRE LEOS SMOOTH 1.6 (WIRE) IMPLANT
LABEL OR SOLS (LABEL) ×3 IMPLANT
MANIFOLD NEPTUNE II (INSTRUMENTS) ×3 IMPLANT
NDL FILTER BLUNT 18X1 1/2 (NEEDLE) ×3 IMPLANT
NDL HYPO 25X1 1.5 SAFETY (NEEDLE) ×9 IMPLANT
NEEDLE FILTER BLUNT 18X1 1/2 (NEEDLE) ×3 IMPLANT
NEEDLE HYPO 25X1 1.5 SAFETY (NEEDLE) ×6 IMPLANT
NS IRRIG 500ML POUR BTL (IV SOLUTION) ×3 IMPLANT
PACK EXTREMITY ARMC (MISCELLANEOUS) ×3 IMPLANT
PAD ABD DERMACEA PRESS 5X9 (GAUZE/BANDAGES/DRESSINGS) ×3 IMPLANT
PAD CAST 4YDX4 CTTN HI CHSV (CAST SUPPLIES) ×3 IMPLANT
PLATE 0D MTP RT SM (Plate) IMPLANT
PLATE TACK LEOS 20 (WIRE) IMPLANT
SCREW AT3 MINI X30 (Screw) IMPLANT
SCREW LOCK 2.7X12 (Screw) IMPLANT
SCREW LOCK VA LEOS 3.5X14 (Screw) IMPLANT
SCREW NLOCK LEOS 3.5X18 (Screw) IMPLANT
STOCKINETTE M/LG 89821 (MISCELLANEOUS) ×3 IMPLANT
SUT MNCRL AB 3-0 PS2 27 (SUTURE) ×3 IMPLANT
SUT MNCRL AB 4-0 PS2 18 (SUTURE) ×3 IMPLANT
SUT MNCRL+ 5-0 UNDYED PC-3 (SUTURE) ×3 IMPLANT
SUT PROLENE 3 0 PS 2 (SUTURE) IMPLANT
SUT PROLENE 4 0 PS 2 18 (SUTURE) IMPLANT
SYR 10ML LL (SYRINGE) ×6 IMPLANT
TACK PLATE LEOS 20 (WIRE) ×6 IMPLANT
TRAP FLUID SMOKE EVACUATOR (MISCELLANEOUS) ×3 IMPLANT
WATER STERILE IRR 500ML POUR (IV SOLUTION) ×3 IMPLANT
WIRE GUIDE SINGLE TROC 1.1X150 (WIRE) IMPLANT
WIRE Z .062 C-WIRE SPADE TIP (WIRE) IMPLANT

## 2024-02-02 NOTE — Transfer of Care (Signed)
 Immediate Anesthesia Transfer of Care Note  Patient: Theresia Flasher  Procedure(s) Performed: FUSION, JOINT, GREAT TOE (Right: Foot) HAMMER TOE CORRECTION 2ND & 3RD (Right: Toe) CAPSULOTOMY METATARSOPHALANGEAL RELEASE (Right)  Patient Location: PACU  Anesthesia Type:General  Level of Consciousness: awake, alert , and oriented  Airway & Oxygen Therapy: Patient Spontanous Breathing  Post-op Assessment: Report given to RN and Post -op Vital signs reviewed and stable  Post vital signs: stable  Last Vitals:  Vitals Value Taken Time  BP 119/82 02/02/24 0926  Temp 36.2 C 02/02/24 0926  Pulse 82 02/02/24 0927  Resp 19 02/02/24 0927  SpO2 98 % 02/02/24 0927  Vitals shown include unfiled device data.  Last Pain:  Vitals:   02/02/24 0637  TempSrc: Temporal  PainSc: 0-No pain         Complications: No notable events documented.

## 2024-02-02 NOTE — Op Note (Signed)
 Surgeon: Surgeon(s): [name], DPM  Assistants: None Pre-operative diagnosis: Right BUNION HAMMERTOE  Post-operative diagnosis: same Procedure: Procedure(s) (LRB): FUSION, JOINT, GREAT TOE (Right) HAMMER TOE CORRECTION 2ND & 3RD (Right) CAPSULOTOMY METATARSOPHALANGEAL RELEASE (Right)  Pathology: * No specimens in log *  Pertinent Intra-op findings: Severe bunion deformity/arthritis noted at first metatarsophalangeal joint.  Hammertoe contracture and metatarsophalangeal joint contracture of 2nd and 3rd digit noted* Anesthesia: Choice  Hemostasis:  Total Tourniquet Time Documented: Calf (Right) - 71 minutes Total: Calf (Right) - 71 minutes  EBL: 5 mL  Materials: Acumed plates and screws with 0.062 wires x 2.  3-0 4-0 Monocryl, 3-0 Prolene Injectables: None Complications: None  Indications for surgery: A 63 y.o. adult presents with right first metatarsophalangeal joint severe deformity with underlying arthritis and right 2nd and 3rd hammertoe contracture with metatarsophalangeal joint contracture 2nd and 3rd. Patient has failed all conservative therapy including but not limited to . He wishes to have surgical correction of the foot/deformity. It was determined that patient would benefit from right first metatarsophalangeal joint fusion with right 2nd and 3rd hammertoe contracture with capsulotomy of right second and. Informed surgical risk consent was reviewed and read aloud to the patient.  I reviewed the films.  I have discussed my findings with the patient in great detail.  I have discussed all risks including but not limited to infection, stiffness, scarring, limp, disability, deformity, damage to blood vessels and nerves, numbness, poor healing, need for braces, arthritis, chronic pain, amputation, death.  All benefits and realistic expectations discussed in great detail.  I have made no promises as to the outcome.  I have provided realistic expectations.  I have offered the patient a  2nd opinion, which they have declined and assured me they preferred to proceed despite the risks   Procedure in detail: The patient was both verbally and visually identified by myself, the nursing staff, and anesthesia staff in the preoperative holding area. They were then transferred to the operating room and placed on the operative table in supine position.  Attention was directed to the dorsal aspect of the right first metatarsophalangeal joint where a linear skin incision approximately 6cm long was made in the skin using a #15 blade. This incision was carried down through the subcutaneous tissue taking care to clamp and cauterize all neurovascular structures as necessary. The capsule of 1st MPJ was identified. A linear capsulotomy was then performed in-line with the original skin incision and the capsule was reflected both medially and laterally to expose the head of the first metatarsal and the base of the proximal phalanx. The articular surface of the 1st metatarsal head was noted to show evidence of [moderate] degeneration under direct visualization. The sagittal saw was then used to resect the dorsal, medial, and lateral prominences off of the 1st metatarsal. A rongeur was utilized to remove the dorsal exostosis of the proximal phalanx. A curette was used to remove some of the cartilage, and the remaining cartilage was removed using a burr until punctate bleeding could be noted on the metatarsal head and base of proximal phalanx. The site as flushed with copious amounts of sterile saline. The distal aspect of the 1st metatarsal and the base of the proximal phalanx were then subchondrally drilled utilizing a pineapple burr. A Temporary K wire was then placed from distal-medial to proximal-lateral across the 1st MPJ, and the position was confirmed to be satisfactory utilizing fluoroscopy.. A dorsal right1st MPJ fusion locking plate was then applied and secured with  two olive wires. interfragmentary screw  was inserted in lag fashion standard 4.0 cancellous. The position was confirmed to be satisfactory with fluoroscopy, and two screws were placed both proximally and distally for a total of four Nonlocking/locking screws utilized. The 1st MPJ was then stressed intraoperatively and no motion or gapping was noted across the fusion site. Final imaging via fluoroscopy was taken to confirm adequate placement and rectus position of the hallux. The surgical site was copiously irrigated with sterile saline. The capsule was then reapproximated with 3-0 Monocortical in a simple interrupted fashion. The subcutaneous tissue was then reapproximated with 4-0 monocryl in a running fashion. The subcuticular was performed utilizing 5-0 Monocryl.   Attention was direct directed right 2nd and 3rd digit, skin marker was used to delineate longitudinal incision spanning across the PIPJ joint and MPJ joint of both 2nd and 3rd.  Using #15 blade incision was carried down from epidermal dermal junction down to the level of the tendon.  A transverse tenotomy was performed at the PIPJ joint for both 2nd and 3rd.  Proximal phalanx head arthroplasty was performed in standard technique.  At this time more contracture was noted at the 2nd and 3rd metatarsophalangeal joint therefore collateral ligaments were released and using McGlamery elevator the joint was freed and capsulotomy was performed in standard technique.  At this time good rectus alignment of 2nd and 3rd digit with both joints noted.  A K wire was integrated drilled through the distal tip and retrograded drilled into the MPJ joint for both 2nd and 3rd.  Good correction alignment noted.  Position confirmed through the fluoroscopy.  At the conclusion of the procedure the patient was awoken from anesthesia and found to have tolerated the procedure well any complications. There were transferred to PACU with vital signs stable and vascular status intact.  Areil Ottey, DPM

## 2024-02-02 NOTE — Anesthesia Postprocedure Evaluation (Signed)
 Anesthesia Post Note  Patient: Teacher, adult education  Procedure(s) Performed: FUSION, JOINT, GREAT TOE (Right: Foot) HAMMER TOE CORRECTION 2ND & 3RD (Right: Toe) CAPSULOTOMY METATARSOPHALANGEAL RELEASE (Right)  Patient location during evaluation: PACU Anesthesia Type: General Level of consciousness: awake and alert Pain management: pain level controlled Vital Signs Assessment: post-procedure vital signs reviewed and stable Respiratory status: spontaneous breathing, nonlabored ventilation, respiratory function stable and patient connected to nasal cannula oxygen Cardiovascular status: blood pressure returned to baseline and stable Postop Assessment: no apparent nausea or vomiting Anesthetic complications: no   No notable events documented.   Last Vitals:  Vitals:   02/02/24 1015 02/02/24 1026  BP: 114/79 115/76  Pulse: 75 70  Resp: 16 16  Temp:  36.4 C  SpO2: 98% 100%    Last Pain:  Vitals:   02/02/24 1026  TempSrc: Temporal  PainSc: 6                  Vanice Genre

## 2024-02-02 NOTE — Discharge Instructions (Signed)
 After Surgery Instructions   1) If you are recuperating from surgery anywhere other than home, please be sure to leave [place] the number where you can be reached.  2) Go directly home and rest.  3) Keep the operated foot(feet) elevated six inches above the hip when sitting or lying down. This will help control swelling and pain.  4) Support the elevated foot and leg with pillows. DO NOT PLACE PILLOWS UNDER THE KNEE.  5) DO NOT REMOVE or get your bandages WET, unless you were given different instructions by your doctor to do so. This increases the risk of infection.  6) Wear your surgical shoe or surgical boot at all times when you are up on your feet.  7) A limited amount of pain and swelling may occur. The skin may take on a bruised appearance. DO NOT BE ALARMED, THIS IS NORMAL.  8) For slight pain and swelling, apply an ice pack directly over the bandages for 15 minutes only out of each hour of the day. Continue until seen in the office for your first post op visit. DON NOT     APPLY ANY FORM OF HEAT TO THE AREA.  9) Have prescriptions filled immediately and take as directed.  10) Drink lots of liquids, water and juice to stay hydrated.  11) CALL IMMEDIATELY IF:  *Bleeding continues until the following day of surgery  *Pain increases and/or does not respond to medication  *Bandages or cast appears to tight  *If your bandage gets wet  *Trip, fall or stump your surgical foot  *If your temperature goes above 101  *If you have ANY questions at all  [place]. ADHERING TO THESE INSTRUCTIONS WILL OFFER YOU THE MOST COMPLETE RESULTS

## 2024-02-02 NOTE — Anesthesia Preprocedure Evaluation (Signed)
 Anesthesia Evaluation  Patient identified by MRN, date of birth, ID band Patient awake    Reviewed: Allergy & Precautions, H&P , NPO status , Patient's Chart, lab work & pertinent test results, reviewed documented beta blocker date and time   History of Anesthesia Complications Negative for: history of anesthetic complications  Airway Mallampati: III  TM Distance: >3 FB Neck ROM: full    Dental  (+) Edentulous Upper, Edentulous Lower, Dental Advidsory Given   Pulmonary neg shortness of breath, neg sleep apnea, neg COPD, neg recent URI, Current Smoker   Pulmonary exam normal breath sounds clear to auscultation       Cardiovascular Exercise Tolerance: Good negative cardio ROS Normal cardiovascular exam Rhythm:regular Rate:Normal     Neuro/Psych negative neurological ROS  negative psych ROS   GI/Hepatic negative GI ROS, Neg liver ROS,,,  Endo/Other  negative endocrine ROS    Renal/GU negative Renal ROS  negative genitourinary   Musculoskeletal   Abdominal   Peds  Hematology negative hematology ROS (+)   Anesthesia Other Findings Past Medical History: No date: Arthritis     Comment:  hands 03/29/2020: Bilateral carpal tunnel syndrome No date: Cervical radiculopathy No date: Hammertoe of right foot No date: Spinal stenosis in cervical region   Reproductive/Obstetrics negative OB ROS                             Anesthesia Physical Anesthesia Plan  ASA: 2  Anesthesia Plan: General   Post-op Pain Management: Regional block*   Induction: Intravenous  PONV Risk Score and Plan: 1 and Ondansetron, Dexamethasone, Midazolam and Treatment may vary due to age or medical condition  Airway Management Planned: LMA and Oral ETT  Additional Equipment:   Intra-op Plan:   Post-operative Plan: Extubation in OR  Informed Consent: I have reviewed the patients History and Physical, chart, labs  and discussed the procedure including the risks, benefits and alternatives for the proposed anesthesia with the patient or authorized representative who has indicated his/her understanding and acceptance.     Dental Advisory Given  Plan Discussed with: Anesthesiologist, CRNA and Surgeon  Anesthesia Plan Comments:        Anesthesia Quick Evaluation

## 2024-02-02 NOTE — Interval H&P Note (Signed)
 History and Physical Interval Note:  02/02/2024 7:45 AM  Mark Hampton  has presented today for surgery, with the diagnosis of BUNION HAMMERTOE.  The various methods of treatment have been discussed with the patient and family. After consideration of risks, benefits and other options for treatment, the patient has consented to  Procedure(s) with comments: FUSION, JOINT, GREAT TOE (Right) - POPLITEAL BLOCK HAMMER TOE CORRECTION 2ND & 3RD (Right) CAPSULOTOMY METATARSOPHALANGEAL RELEASE (Right) as a surgical intervention.  The patient's history has been reviewed, patient examined, no change in status, stable for surgery.  I have reviewed the patient's chart and labs.  Questions were answered to the patient's satisfaction.     [name]

## 2024-02-02 NOTE — Anesthesia Procedure Notes (Signed)
 Anesthesia Regional Block: Popliteal block   Pre-Anesthetic Checklist: , timeout performed,  Correct Patient, Correct Site, Correct Laterality,  Correct Procedure, Correct Position, site marked,  Risks and benefits discussed,  Surgical consent,  Pre-op evaluation,  At surgeon's request and post-op pain management  Laterality: Lower and Right  Prep: chloraprep       Needles:  Injection technique: Single-shot  Needle Type: Echogenic Needle     Needle Length: 9cm  Needle Gauge: 21     Additional Needles:   Procedures:,,,, ultrasound used (permanent image in chart),,    Narrative:  Start time: 02/02/2024 7:30 AM End time: 02/02/2024 7:33 AM Injection made incrementally with aspirations every 5 mL.  Performed by: Personally  Anesthesiologist: Vanice Genre, MD  Additional Notes: Patient consented for risk and benefits of nerve block including but not limited to nerve damage, failed block, bleeding and infection.  Patient voiced understanding.  Functioning IV was confirmed and monitors were applied.  Timeout done prior to procedure and prior to any sedation being given to the patient.  Patient confirmed procedure site prior to any sedation given to the patient.  A 50mm 22ga Stimuplex needle was used. Sterile prep,hand hygiene and sterile gloves were used.  Minimal sedation used for procedure.  No paresthesia endorsed by patient during the procedure.  Negative aspiration and negative test dose prior to incremental administration of local anesthetic. The patient tolerated the procedure well with no immediate complications.

## 2024-02-02 NOTE — Anesthesia Procedure Notes (Signed)
 Procedure Name: LMA Insertion Date/Time: 02/02/2024 7:35 AM  Performed by: Wilkins Hardy I, CRNAPre-anesthesia Checklist: Patient identified, Patient being monitored, Timeout performed, Emergency Drugs available and Suction available Patient Re-evaluated:Patient Re-evaluated prior to induction Oxygen Delivery Method: Circle system utilized Preoxygenation: Pre-oxygenation with 100% oxygen Induction Type: IV induction Ventilation: Mask ventilation without difficulty LMA: LMA inserted LMA Size: 4.0 Tube type: Oral Number of attempts: 1 Placement Confirmation: positive ETCO2 and breath sounds checked- equal and bilateral Tube secured with: Tape Dental Injury: Teeth and Oropharynx as per pre-operative assessment

## 2024-02-02 NOTE — Progress Notes (Signed)
 Patient and spouse asked for crutches for patient to take home. Notified Dr. Lydia Sams and agreeable. Education given and crutches taken to private vehicle with patient.

## 2024-02-03 ENCOUNTER — Encounter: Payer: Self-pay | Admitting: Podiatry

## 2024-02-10 ENCOUNTER — Ambulatory Visit: Payer: Medicaid Other | Admitting: Sports Medicine

## 2024-02-11 ENCOUNTER — Ambulatory Visit (INDEPENDENT_AMBULATORY_CARE_PROVIDER_SITE_OTHER)

## 2024-02-11 ENCOUNTER — Ambulatory Visit: Payer: Medicaid Other | Admitting: Podiatry

## 2024-02-11 DIAGNOSIS — M216X1 Other acquired deformities of right foot: Secondary | ICD-10-CM | POA: Diagnosis not present

## 2024-02-11 DIAGNOSIS — M19071 Primary osteoarthritis, right ankle and foot: Secondary | ICD-10-CM

## 2024-02-11 DIAGNOSIS — M24574 Contracture, right foot: Secondary | ICD-10-CM

## 2024-02-11 DIAGNOSIS — M2041 Other hammer toe(s) (acquired), right foot: Secondary | ICD-10-CM

## 2024-02-11 DIAGNOSIS — Z9889 Other specified postprocedural states: Secondary | ICD-10-CM

## 2024-02-11 MED ORDER — OXYCODONE-ACETAMINOPHEN 5-325 MG PO TABS: 1.0000 | ORAL_TABLET | ORAL | 0 refills | Status: DC | PRN

## 2024-02-18 NOTE — Progress Notes (Signed)
 Subjective:  Patient ID: Mark Hampton, adult    DOB: 19-Mar-1961,  MRN: [id number]  No chief complaint on file.   DOS: 02/02/2024 Procedure: Right first MPJ fusion 2nd and 3rd hammertoe repair with capsulotomy  63 y.o. adult returns for post-op check.  Patient states that he is doing well denies any other current pain is controlled.  Bandages clean dry and intact nonweightbearing to the right lower extremity ambulating with a cam boot  Review of Systems: Negative except as noted in the HPI. Denies N/V/F/Ch.  Past Medical History:  Diagnosis Date   Arthritis    hands   Bilateral carpal tunnel syndrome 03/29/2020   Cervical radiculopathy    Hammertoe of right foot    Spinal stenosis in cervical region     Current Outpatient Medications:    oxyCODONE -acetaminophen  (PERCOCET) 5-325 MG tablet, Take 1 tablet by mouth every 4 (four) hours as needed for severe pain (pain score 7-10)., Disp: 30 tablet, Rfl: 0   acetaminophen  (TYLENOL ) 500 MG tablet, Take 1,000 mg by mouth every 6 (six) hours as needed for moderate pain., Disp: , Rfl:    atorvastatin  (LIPITOR) 10 MG tablet, Take 1 tablet (10 mg total) by mouth daily., Disp: 90 tablet, Rfl: 3   Cholecalciferol (VITAMIN D3) 10 MCG (400 UNIT) tablet, Take 400 Units by mouth daily with lunch., Disp: , Rfl:    gabapentin  (NEURONTIN ) 300 MG capsule, Take 1 capsule (300 mg total) by mouth 3 (three) times daily. (Patient taking differently: Take 300 mg by mouth 2 (two) times daily.), Disp: 90 capsule, Rfl: 3   ibuprofen  (ADVIL ) 800 MG tablet, Take 1 tablet (800 mg total) by mouth every 6 (six) hours as needed., Disp: 60 tablet, Rfl: 1   mometasone  (ELOCON ) 0.1 % cream, Apply on itchy spots on legs  twice daily (Patient taking differently: Apply 1 Application topically 2 (two) times daily as needed (skin irritation/itching.).), Disp: 15 g, Rfl: 1   nicotine  (NICODERM CQ ) 21 mg/24hr patch, Place 1 patch (21 mg total) onto the skin daily. (Patient taking  differently: Place onto the skin daily as needed (nicotine  dependence).), Disp: 28 patch, Rfl: 0   oxyCODONE -acetaminophen  (PERCOCET) 5-325 MG tablet, Take 1 tablet by mouth every 4 (four) hours as needed for severe pain (pain score 7-10)., Disp: 30 tablet, Rfl: 0   vitamin E  180 MG (400 UNITS) capsule, Take 400 Units by mouth daily with lunch., Disp: , Rfl:   Social History   Tobacco Use  Smoking Status Every Day   Current packs/day: 0.25   Average packs/day: 0.3 packs/day for 25.0 years (6.3 ttl pk-yrs)   Types: Cigarettes  Smokeless Tobacco Never  Tobacco Comments   About 1-2 cigs daily. Pt uses nicotine  patch.    No Known Allergies Objective:  There were no vitals filed for this visit. There is no height or weight on file to calculate BMI. Constitutional Well developed. Well nourished.  Vascular Foot warm and well perfused. Capillary refill normal to all digits.   Neurologic Normal speech. Oriented to person, place, and time. Epicritic sensation to light touch grossly present bilaterally.  Dermatologic Skin healing well without signs of infection. Skin edges well coapted without signs of infection.  Orthopedic: Tenderness to palpation noted about the surgical site.   Radiographs: 3 views of skeletallyShoulder right foot: Hardware is intact no signs of backing or loosening noted.  No other abnormalities identified. Assessment:   1. Arthritis of first metatarsophalangeal (MTP) joint of right foot   2.  Hammertoe of right foot   3. Joint contracture of foot, right   4. Status post foot surgery    Plan:  Patient was evaluated and treated and all questions answered.  S/p foot surgery right -Progressing as expected post-operatively. -XR: See above -WB Status: Nonweightbearing in right lower extremity with crutches -Sutures: Intact.  No clinical signs of dehiscence noted no complication noted. -Medications: None -Foot redressed.  No follow-ups on file.

## 2024-02-20 ENCOUNTER — Ambulatory Visit (HOSPITAL_COMMUNITY): Admission: EM | Admit: 2024-02-20 | Discharge: 2024-02-20 | Disposition: A | Discharge status: Home / Self Care

## 2024-02-20 ENCOUNTER — Other Ambulatory Visit: Payer: Self-pay | Admitting: Podiatry

## 2024-02-20 ENCOUNTER — Encounter (HOSPITAL_COMMUNITY): Payer: Self-pay | Admitting: Emergency Medicine

## 2024-02-20 DIAGNOSIS — L509 Urticaria, unspecified: Secondary | ICD-10-CM | POA: Diagnosis not present

## 2024-02-20 DIAGNOSIS — L282 Other prurigo: Secondary | ICD-10-CM

## 2024-02-20 MED ORDER — PREDNISONE 20 MG PO TABS: 40.0000 mg | ORAL_TABLET | Freq: Every day | ORAL | 0 refills | Status: AC

## 2024-02-20 MED ORDER — CETIRIZINE HCL 10 MG PO TABS: 10.0000 mg | ORAL_TABLET | Freq: Every day | ORAL | 0 refills | Status: DC

## 2024-02-20 MED ORDER — FAMOTIDINE 40 MG PO TABS: 40.0000 mg | ORAL_TABLET | Freq: Every day | ORAL | 0 refills | Status: DC

## 2024-02-20 NOTE — ED Provider Notes (Signed)
 [facility]    CSN: [id number] Arrival date & time: 02/20/24  1208      History   Chief Complaint Chief Complaint  Patient presents with   Rash    HPI Mark Hampton is a 63 y.o. adult.   Patient presents today with a 2 to 3-week history of warts purpuric rash.  This started on his leg but then spread to involve the majority of his body including all 4 extremities and trunk.  He did see his primary care who prescribed topical mometasone  which he has been using without improvement of symptoms.  He denies any history of dermatological condition including eczema or psoriasis.  Denies any changes to personal hygiene products including soaps or detergents.  Denies any new exposures including plants, insects, animals, new foods, new medications.  He denies any swelling of his throat, shortness of breath, muffled voice.  He reports that the rash is intensely pruritic and interfering with his ability to perform daily activities.    Past Medical History:  Diagnosis Date   Arthritis    hands   Bilateral carpal tunnel syndrome 03/29/2020   Cervical radiculopathy    Hammertoe of right foot    Spinal stenosis in cervical region     Patient Active Problem List   Diagnosis Date Noted   Impingement syndrome of left shoulder region 07/15/2023   Ulnar neuropathy 11/13/2021   Spinal stenosis of cervical region 11/05/2021   Shoulder joint pain 05/01/2021   Paresthesia of skin 02/20/2021   Numbness and tingling in right hand 02/20/2021   Neck pain 02/20/2021   Elevated blood-pressure reading, without diagnosis of hypertension 09/19/2020   Myelopathy (HCC) 06/28/2020   Bilateral carpal tunnel syndrome 03/29/2020   Dental abscess 08/24/2016   Neck abscess 08/24/2016    Past Surgical History:  Procedure Laterality Date   ANTERIOR CERVICAL DECOMP/DISCECTOMY FUSION N/A 06/28/2020   Procedure: Anterior Cervical Decompression Fusion - Cervical three-four;  Surgeon: Gearl Keens, MD;   Location: [facility] OR;  Service: Neurosurgery;  Laterality: N/A;   ANTERIOR CERVICAL DECOMP/DISCECTOMY FUSION N/A 11/05/2021   Procedure: Anterior Cervical Discectomy and Fusion Cervical Four-Five/Cervical Five-Six/Cervical Six-Seven with Hardware Removal Cervical Three-Four;  Surgeon: Gearl Keens, MD;  Location: [facility] OR;  Service: Neurosurgery;  Laterality: N/A;   CAPSULOTOMY METATARSOPHALANGEAL Right 02/02/2024   Procedure: CAPSULOTOMY METATARSOPHALANGEAL RELEASE;  Surgeon: [name], DPM;  Location: [facility] ORS;  Service: Orthopedics/Podiatry;  Laterality: Right;   DENTAL SURGERY  08/24/2016   HALLUX FUSION Right 02/02/2024   Procedure: FUSION, JOINT, GREAT TOE;  Surgeon: [name], DPM;  Location: [facility] ORS;  Service: Orthopedics/Podiatry;  Laterality: Right;  POPLITEAL BLOCK   HAMMER TOE SURGERY Right 02/02/2024   Procedure: HAMMER TOE CORRECTION 2ND & 3RD;  Surgeon: [name], DPM;  Location: [facility] ORS;  Service: Orthopedics/Podiatry;  Laterality: Right;   INCISION AND DRAINAGE Right 08/24/2016   right neck odontogenic abscess   ULNAR NERVE TRANSPOSITION Right 01/07/2023   Procedure: Right carpal tunnel release.  Right ulnar nerve release at the elbow.  Right scaphoid excision and 4 corner fusion with extensor pollicis longus transfer and posterior interosseous nerve neurectomy;  Surgeon: Ronn Cohn, MD;  Location: [facility] OR;  Service: Orthopedics;  Laterality: Right;  2 hrs Block with IV Sedation    OB History   No obstetric history on file.      Home Medications    Prior to Admission medications   Medication Sig Start Date End Date Taking? Authorizing Provider  cetirizine (ZYRTEC ALLERGY) 10 MG tablet Take 1 tablet (10 mg total) by mouth daily. 02/20/24  Yes Aireana Ryland K, PA-C  famotidine (PEPCID) 40 MG tablet Take 1 tablet (40 mg total) by mouth at bedtime. 02/20/24  Yes Tyland Klemens K, PA-C  KLOXXADO 8 MG/0.1ML LIQD Place into both nostrils. 02/11/24  Yes [provider]   predniSONE  (DELTASONE ) 20 MG tablet Take 2 tablets (40 mg total) by mouth daily for 5 days. 02/20/24 02/25/24 Yes Taijon Vink K, PA-C  acetaminophen  (TYLENOL ) 500 MG tablet Take 1,000 mg by mouth every 6 (six) hours as needed for moderate pain.    [provider]  atorvastatin  (LIPITOR) 10 MG tablet Take 1 tablet (10 mg total) by mouth daily. 07/28/23   Tye Gall, MD  Cholecalciferol (VITAMIN D3) 10 MCG (400 UNIT) tablet Take 400 Units by mouth daily with lunch.    [provider]  gabapentin  (NEURONTIN ) 300 MG capsule Take 1 capsule (300 mg total) by mouth 3 (three) times daily. Patient taking differently: Take 300 mg by mouth 2 (two) times daily. 11/04/23   Tye Gall, MD  ibuprofen  (ADVIL ) 800 MG tablet Take 1 tablet (800 mg total) by mouth every 6 (six) hours as needed. 02/02/24   [name], DPM  mometasone  (ELOCON ) 0.1 % cream Apply on itchy spots on legs  twice daily Patient taking differently: Apply 1 Application topically 2 (two) times daily as needed (skin irritation/itching.). 12/30/23   Tye Gall, MD  nicotine  (NICODERM CQ ) 21 mg/24hr patch Place 1 patch (21 mg total) onto the skin daily. Patient taking differently: Place onto the skin daily as needed (nicotine  dependence). 10/22/22   Timothy Ford, MD  oxyCODONE -acetaminophen  (PERCOCET) 5-325 MG tablet Take 1 tablet by mouth every 4 (four) hours as needed for severe pain (pain score 7-10). 02/02/24   [name], DPM  oxyCODONE -acetaminophen  (PERCOCET) 5-325 MG tablet Take 1 tablet by mouth every 4 (four) hours as needed for severe pain (pain score 7-10). 02/11/24   [name], DPM  vitamin E  180 MG (400 UNITS) capsule Take 400 Units by mouth daily with lunch.    [provider]    Family History Family History  Problem Relation Age of Onset   Colon cancer Neg Hx    Colon polyps Neg Hx    Esophageal cancer Neg Hx    Rectal cancer Neg Hx    Stomach cancer Neg Hx      Social History Social History   Tobacco Use   Smoking status: Every Day    Current packs/day: 0.25    Average packs/day: 0.3 packs/day for 25.0 years (6.3 ttl pk-yrs)    Types: Cigarettes   Smokeless tobacco: Never   Tobacco comments:    About 1-2 cigs daily. Pt uses nicotine  patch.  Vaping Use   Vaping status: Never Used  Substance Use Topics   Alcohol use: Yes    Alcohol/week: 6.0 standard drinks of alcohol    Types: 6 Cans of beer per week   Drug use: Never     Allergies   Patient has no known allergies.   Review of Systems Review of Systems  Constitutional:  Positive for activity change. Negative for appetite change, fatigue and fever.  HENT:  Negative for sore throat, trouble swallowing and voice change.   Respiratory:  Negative for cough and shortness of breath.   Cardiovascular:  Negative for chest pain.  Gastrointestinal:  Negative for abdominal pain, diarrhea, nausea and  vomiting.  Skin:  Positive for rash.     Physical Exam Triage Vital Signs ED Triage Vitals  Encounter Vitals Group     BP 02/20/24 1219 (!) 148/81     Systolic BP Percentile --      Diastolic BP Percentile --      Pulse Rate 02/20/24 1219 80     Resp 02/20/24 1219 17     Temp 02/20/24 1219 97.7 F (36.5 C)     Temp Source 02/20/24 1219 Oral     SpO2 02/20/24 1219 97 %     Weight --      Height --      Head Circumference --      Peak Flow --      Pain Score 02/20/24 1218 0     Pain Loc --      Pain Education --      Exclude from Growth Chart --    No data found.  Updated Vital Signs BP (!) 148/81 (BP Location: Right Arm)   Pulse 80   Temp 97.7 F (36.5 C) (Oral)   Resp 17   SpO2 97%   Visual Acuity Right Eye Distance:   Left Eye Distance:   Bilateral Distance:    Right Eye Near:   Left Eye Near:    Bilateral Near:     Physical Exam Vitals reviewed.  Constitutional:      General: He is awake.     Appearance: Normal appearance. He is well-developed. He is  not ill-appearing.     Comments: Very pleasant male appears stated age in no acute distress sitting comfortably in exam room  HENT:     Head: Normocephalic and atraumatic.     Mouth/Throat:     Pharynx: Uvula midline. No oropharyngeal exudate, posterior oropharyngeal erythema or uvula swelling.  Cardiovascular:     Rate and Rhythm: Normal rate and regular rhythm.     Heart sounds: Normal heart sounds, S1 normal and S2 normal. No murmur heard. Pulmonary:     Effort: Pulmonary effort is normal.     Breath sounds: Normal breath sounds. No stridor. No wheezing, rhonchi or rales.     Comments: Clear to auscultation bilaterally Skin:    Findings: Rash present. Rash is papular and urticarial.     Comments: Widespread fine papular rash with urticarial lesions on back and forearms.  Widespread excoriation without bleeding or drainage.  Neurological:     Mental Status: He is alert.  Psychiatric:        Behavior: Behavior is cooperative.      UC Treatments / Results  Labs (all labs ordered are listed, but only abnormal results are displayed) Labs Reviewed - No data to display  EKG   Radiology No results found.  Procedures Procedures (including critical care time)  Medications Ordered in UC Medications - No data to display  Initial Impression / Assessment and Plan / UC Course  I have reviewed the triage vital signs and the nursing notes.  Pertinent labs & imaging results that were available during my care of the patient were reviewed by me and considered in my medical decision making (see chart for details).     Patient is well-appearing, afebrile, nontoxic, nontachycardic.  Rash has urticarial/allergic appearance will treat with H1 and H2 blockade as well as systemic steroids given persistent spread despite topical steroid use.  He was also encouraged to use emollient in addition to the previously prescribed topical steroid to help manage his  symptoms.  Recommended hypoallergenic  soaps and detergents.  Low suspicion for syphilis but we discussed that this can sometimes cause an unusual rash; he had no concerns for this since the testing was deferred.  If his symptoms are improving quickly with medication regimen I did recommend he follow-up with dermatology and he was given the contact information for local provider with instruction to call to schedule an appointment.  If anything worsens or changes he needs to be seen immediately.  Strict return precautions given.  All questions answered to patient satisfaction.  Final Clinical Impressions(s) / UC Diagnoses   Final diagnoses:  Urticarial rash  Pruritic rash     Discharge Instructions      Start Zyrtec daily.  Take famotidine at night.  Please drink plenty of fluid with these medications.  Start prednisone  40 mg for 5 days.  Do not take NSAIDs with this medication including aspirin , ibuprofen /Advil , naproxen/Aleve.  Use hypoallergenic soaps and detergents.  I also recommend using an emollient such as Aquaphor combined with the topical steroid (mometasone ) that your primary care prescribed.  Avoid any new foods or other exposures.  If your symptoms are not improving quickly please follow-up with dermatology; call to schedule an appointment.  If anything worsens and you have worsening rash, swelling of your throat, shortness of breath, difficulty swallowing, nausea/vomiting you need to be seen immediately.     ED Prescriptions     Medication Sig Dispense Auth. Provider   cetirizine (ZYRTEC ALLERGY) 10 MG tablet Take 1 tablet (10 mg total) by mouth daily. 30 tablet Assad Harbeson K, PA-C   famotidine (PEPCID) 40 MG tablet Take 1 tablet (40 mg total) by mouth at bedtime. 30 tablet Jocob Dambach K, PA-C   predniSONE  (DELTASONE ) 20 MG tablet Take 2 tablets (40 mg total) by mouth daily for 5 days. 10 tablet Kawan Valladolid K, PA-C      PDMP not reviewed this encounter.   Budd Cargo, PA-C 02/20/24 1246

## 2024-02-20 NOTE — ED Triage Notes (Signed)
 Pt has a rash that seems to be spreading more all over for 2 weeks. Doctor prescribed mometasone  cream but not helping.

## 2024-02-20 NOTE — Discharge Instructions (Addendum)
 Start Zyrtec daily.  Take famotidine at night.  Please drink plenty of fluid with these medications.  Start prednisone  40 mg for 5 days.  Do not take NSAIDs with this medication including aspirin , ibuprofen /Advil , naproxen/Aleve.  Use hypoallergenic soaps and detergents.  I also recommend using an emollient such as Aquaphor combined with the topical steroid (mometasone ) that your primary care prescribed.  Avoid any new foods or other exposures.  If your symptoms are not improving quickly please follow-up with dermatology; call to schedule an appointment.  If anything worsens and you have worsening rash, swelling of your throat, shortness of breath, difficulty swallowing, nausea/vomiting you need to be seen immediately.

## 2024-02-25 ENCOUNTER — Ambulatory Visit (INDEPENDENT_AMBULATORY_CARE_PROVIDER_SITE_OTHER): Admitting: Podiatry

## 2024-02-25 ENCOUNTER — Encounter: Payer: Medicaid Other | Admitting: Podiatry

## 2024-02-25 DIAGNOSIS — Z9889 Other specified postprocedural states: Secondary | ICD-10-CM

## 2024-02-25 DIAGNOSIS — M24574 Contracture, right foot: Secondary | ICD-10-CM

## 2024-02-25 DIAGNOSIS — M19071 Primary osteoarthritis, right ankle and foot: Secondary | ICD-10-CM

## 2024-02-25 DIAGNOSIS — M2041 Other hammer toe(s) (acquired), right foot: Secondary | ICD-10-CM

## 2024-02-25 NOTE — Progress Notes (Signed)
 Subjective:  Patient ID: Mark Hampton, adult    DOB: Dec 14, 1960,  MRN: [id number]  Chief Complaint  Patient presents with   Routine Post Op    POV # 2 DOS 02/02/24 --- RT 1ST MPJ FUSION WITH FIXATION, LEFT 2,3 CAPSULOTOMY WITH HAMMERTOE REPAIR WITH FIXATION    DOS: 02/02/2024 Procedure: Right first MPJ fusion 2nd and 3rd hammertoe repair with capsulotomy  63 y.o. adult returns for post-op check.  Patient states that he is doing well denies any other current pain is controlled.  Bandages clean dry and intact nonweightbearing to the right lower extremity ambulating with a cam boot  Review of Systems: Negative except as noted in the HPI. Denies N/V/F/Ch.  Past Medical History:  Diagnosis Date   Arthritis    hands   Bilateral carpal tunnel syndrome 03/29/2020   Cervical radiculopathy    Hammertoe of right foot    Spinal stenosis in cervical region     Current Outpatient Medications:    acetaminophen  (TYLENOL ) 500 MG tablet, Take 1,000 mg by mouth every 6 (six) hours as needed for moderate pain., Disp: , Rfl:    atorvastatin  (LIPITOR) 10 MG tablet, Take 1 tablet (10 mg total) by mouth daily., Disp: 90 tablet, Rfl: 3   cetirizine (ZYRTEC ALLERGY) 10 MG tablet, Take 1 tablet (10 mg total) by mouth daily., Disp: 30 tablet, Rfl: 0   Cholecalciferol (VITAMIN D3) 10 MCG (400 UNIT) tablet, Take 400 Units by mouth daily with lunch., Disp: , Rfl:    famotidine (PEPCID) 40 MG tablet, Take 1 tablet (40 mg total) by mouth at bedtime., Disp: 30 tablet, Rfl: 0   gabapentin  (NEURONTIN ) 300 MG capsule, Take 1 capsule (300 mg total) by mouth 3 (three) times daily. (Patient taking differently: Take 300 mg by mouth 2 (two) times daily.), Disp: 90 capsule, Rfl: 3   ibuprofen  (ADVIL ) 800 MG tablet, TAKE ONE TABLET BY MOUTH EVERY 6 HOURS AS NEEDED, Disp: 60 tablet, Rfl: 1   KLOXXADO 8 MG/0.1ML LIQD, Place into both nostrils., Disp: , Rfl:    mometasone  (ELOCON ) 0.1 % cream, Apply on itchy spots on legs   twice daily (Patient taking differently: Apply 1 Application topically 2 (two) times daily as needed (skin irritation/itching.).), Disp: 15 g, Rfl: 1   nicotine  (NICODERM CQ ) 21 mg/24hr patch, Place 1 patch (21 mg total) onto the skin daily. (Patient taking differently: Place onto the skin daily as needed (nicotine  dependence).), Disp: 28 patch, Rfl: 0   oxyCODONE -acetaminophen  (PERCOCET) 5-325 MG tablet, Take 1 tablet by mouth every 4 (four) hours as needed for severe pain (pain score 7-10)., Disp: 30 tablet, Rfl: 0   oxyCODONE -acetaminophen  (PERCOCET) 5-325 MG tablet, Take 1 tablet by mouth every 4 (four) hours as needed for severe pain (pain score 7-10)., Disp: 30 tablet, Rfl: 0   predniSONE  (DELTASONE ) 20 MG tablet, Take 2 tablets (40 mg total) by mouth daily for 5 days., Disp: 10 tablet, Rfl: 0   vitamin E  180 MG (400 UNITS) capsule, Take 400 Units by mouth daily with lunch., Disp: , Rfl:   Social History   Tobacco Use  Smoking Status Every Day   Current packs/day: 0.25   Average packs/day: 0.3 packs/day for 25.0 years (6.3 ttl pk-yrs)   Types: Cigarettes  Smokeless Tobacco Never  Tobacco Comments   About 1-2 cigs daily. Pt uses nicotine  patch.    No Known Allergies Objective:  There were no vitals filed for this visit. There is no height or weight on  file to calculate BMI. Constitutional Well developed. Well nourished.  Vascular Foot warm and well perfused. Capillary refill normal to all digits.   Neurologic Normal speech. Oriented to person, place, and time. Epicritic sensation to light touch grossly present bilaterally.  Dermatologic Skin completely epithelialized.  No signs of dehiscence noted.  Deformities holding well.  Good reduction noted so far.  Orthopedic: Tenderness to palpation noted about the surgical site.   Radiographs: 3 views of skeletallyShoulder right foot: Hardware is intact no signs of backing or loosening noted.  No other abnormalities  identified. Assessment:   1. Arthritis of first metatarsophalangeal (MTP) joint of right foot   2. Hammertoe of right foot   3. Joint contracture of foot, right   4. Status post foot surgery     Plan:  Patient was evaluated and treated and all questions answered.  S/p foot surgery right -Progressing as expected post-operatively. -XR: See above -WB Status: Weightbearing as tolerated right lower extremity -Sutures: I removed no clinical signs of dehiscence noted no complication noted. -Medications: None - Sutures and pins were removed the pins were becoming loose likely due to noncompliance he and weightbearing at this time I made the decision to remove the pins as there were mostly backing out.  Patient agrees with the plan.  No complication noted  No follow-ups on file.

## 2024-03-24 ENCOUNTER — Ambulatory Visit: Admitting: Podiatry

## 2024-03-24 ENCOUNTER — Ambulatory Visit (INDEPENDENT_AMBULATORY_CARE_PROVIDER_SITE_OTHER)

## 2024-03-24 DIAGNOSIS — M19071 Primary osteoarthritis, right ankle and foot: Secondary | ICD-10-CM

## 2024-03-24 DIAGNOSIS — Z9889 Other specified postprocedural states: Secondary | ICD-10-CM

## 2024-03-24 DIAGNOSIS — M24574 Contracture, right foot: Secondary | ICD-10-CM

## 2024-03-24 DIAGNOSIS — M2041 Other hammer toe(s) (acquired), right foot: Secondary | ICD-10-CM

## 2024-03-24 MED ORDER — OXYCODONE-ACETAMINOPHEN 5-325 MG PO TABS: 1.0000 | ORAL_TABLET | ORAL | 0 refills | Status: DC | PRN

## 2024-03-24 NOTE — Progress Notes (Signed)
 Subjective:  Patient ID: Mark Hampton, adult    DOB: 05-19-1961,  MRN: [id number]  Chief Complaint  Patient presents with   Routine Post Op    POV # 3 DOS 02/02/24 --- RT 1ST MPJ FUSION WITH FIXATION, LEFT 2,3 CAPSULOTOMY WITH HAMMERTOE REPAIR WITH FIXATIO Pt stated that he has been having some discomfort on the top of his foot     DOS: 02/02/2024 Procedure: Right first MPJ fusion 2nd and 3rd hammertoe repair with capsulotomy  63 y.o. adult returns for post-op check.  Patient states that he is doing well denies any other current pain is controlled.  Bandages clean dry and intact nonweightbearing to the right lower extremity ambulating with a cam boot  Review of Systems: Negative except as noted in the HPI. Denies N/V/F/Ch.  Past Medical History:  Diagnosis Date   Arthritis    hands   Bilateral carpal tunnel syndrome 03/29/2020   Cervical radiculopathy    Hammertoe of right foot    Spinal stenosis in cervical region     Current Outpatient Medications:    oxyCODONE -acetaminophen  (PERCOCET) 5-325 MG tablet, Take 1 tablet by mouth every 4 (four) hours as needed for severe pain (pain score 7-10)., Disp: 30 tablet, Rfl: 0   acetaminophen  (TYLENOL ) 500 MG tablet, Take 1,000 mg by mouth every 6 (six) hours as needed for moderate pain., Disp: , Rfl:    atorvastatin  (LIPITOR) 10 MG tablet, Take 1 tablet (10 mg total) by mouth daily., Disp: 90 tablet, Rfl: 3   cetirizine  (ZYRTEC  ALLERGY) 10 MG tablet, Take 1 tablet (10 mg total) by mouth daily., Disp: 30 tablet, Rfl: 0   Cholecalciferol (VITAMIN D3) 10 MCG (400 UNIT) tablet, Take 400 Units by mouth daily with lunch., Disp: , Rfl:    famotidine  (PEPCID ) 40 MG tablet, Take 1 tablet (40 mg total) by mouth at bedtime., Disp: 30 tablet, Rfl: 0   gabapentin  (NEURONTIN ) 300 MG capsule, Take 1 capsule (300 mg total) by mouth 3 (three) times daily. (Patient taking differently: Take 300 mg by mouth 2 (two) times daily.), Disp: 90 capsule, Rfl: 3    ibuprofen  (ADVIL ) 800 MG tablet, TAKE ONE TABLET BY MOUTH EVERY 6 HOURS AS NEEDED, Disp: 60 tablet, Rfl: 1   KLOXXADO 8 MG/0.1ML LIQD, Place into both nostrils., Disp: , Rfl:    mometasone  (ELOCON ) 0.1 % cream, Apply on itchy spots on legs  twice daily (Patient taking differently: Apply 1 Application topically 2 (two) times daily as needed (skin irritation/itching.).), Disp: 15 g, Rfl: 1   nicotine  (NICODERM CQ ) 21 mg/24hr patch, Place 1 patch (21 mg total) onto the skin daily. (Patient taking differently: Place onto the skin daily as needed (nicotine  dependence).), Disp: 28 patch, Rfl: 0   oxyCODONE -acetaminophen  (PERCOCET) 5-325 MG tablet, Take 1 tablet by mouth every 4 (four) hours as needed for severe pain (pain score 7-10)., Disp: 30 tablet, Rfl: 0   oxyCODONE -acetaminophen  (PERCOCET) 5-325 MG tablet, Take 1 tablet by mouth every 4 (four) hours as needed for severe pain (pain score 7-10)., Disp: 30 tablet, Rfl: 0   vitamin E  180 MG (400 UNITS) capsule, Take 400 Units by mouth daily with lunch., Disp: , Rfl:   Social History   Tobacco Use  Smoking Status Every Day   Current packs/day: 0.25   Average packs/day: 0.3 packs/day for 25.0 years (6.3 ttl pk-yrs)   Types: Cigarettes  Smokeless Tobacco Never  Tobacco Comments   About 1-2 cigs daily. Pt uses nicotine  patch.  No Known Allergies Objective:  There were no vitals filed for this visit. There is no height or weight on file to calculate BMI. Constitutional Well developed. Well nourished.  Vascular Foot warm and well perfused. Capillary refill normal to all digits.   Neurologic Normal speech. Oriented to person, place, and time. Epicritic sensation to light touch grossly present bilaterally.  Dermatologic Skin completely epithelialized.  No signs of dehiscence noted.  Deformities holding well.  Good reduction noted so far.  Orthopedic: Tenderness to palpation noted about the surgical site.   Radiographs: 3 views of  skeletallyShoulder right foot: Hardware is intact no signs of backing or loosening noted.  No other abnormalities identified. Assessment:   1. Arthritis of first metatarsophalangeal (MTP) joint of right foot     Plan:  Patient was evaluated and treated and all questions answered.  S/p foot surgery right - Clinically healed and officially discharged from my care.  Skin has completely epithelialized.  Return patient has returned to regular activity no restriction.  At this time I discussed with patient go to regular activity if any foot and ankle issues arise in future he will come back and see me No follow-ups on file.

## 2024-04-13 ENCOUNTER — Telehealth: Payer: Self-pay | Admitting: Podiatry

## 2024-04-13 MED ORDER — OXYCODONE-ACETAMINOPHEN 5-325 MG PO TABS: 1.0000 | ORAL_TABLET | ORAL | 0 refills | Status: DC | PRN

## 2024-04-13 NOTE — Telephone Encounter (Signed)
 Patient called requesting a refill of pain medication. He would like it sent to [facility] in [place] on [address].

## 2024-04-14 NOTE — Telephone Encounter (Signed)
 Called patient to let him know his medication was sent in.

## 2024-04-20 ENCOUNTER — Encounter: Admitting: Sports Medicine

## 2024-04-21 NOTE — Progress Notes (Signed)
 This encounter was created in error - please disregard.

## 2024-05-06 ENCOUNTER — Other Ambulatory Visit: Payer: Self-pay | Admitting: Podiatry

## 2024-05-06 ENCOUNTER — Telehealth: Payer: Self-pay | Admitting: Podiatry

## 2024-05-06 MED ORDER — OXYCODONE-ACETAMINOPHEN 5-325 MG PO TABS: 1.0000 | ORAL_TABLET | ORAL | 0 refills | Status: DC | PRN

## 2024-05-06 NOTE — Telephone Encounter (Signed)
 Patient called and requested refill of the oxyCODONE -acetaminophen  (PERCOCET) 5-325 MG tablet sent to [facility] on file.

## 2024-05-06 NOTE — Telephone Encounter (Signed)
 Error

## 2024-05-25 ENCOUNTER — Other Ambulatory Visit: Payer: Self-pay | Admitting: Podiatry

## 2024-05-25 MED ORDER — OXYCODONE-ACETAMINOPHEN 5-325 MG PO TABS: 1.0000 | ORAL_TABLET | ORAL | 0 refills | Status: DC | PRN

## 2024-05-25 NOTE — Telephone Encounter (Signed)
 Patient is calling in for his medication. He would like a refill for the pain medication again. Patient states he is out of medication from the June refill.

## 2024-06-15 ENCOUNTER — Other Ambulatory Visit: Payer: Self-pay | Admitting: Podiatry

## 2024-06-15 ENCOUNTER — Ambulatory Visit: Payer: Self-pay

## 2024-06-15 NOTE — Telephone Encounter (Signed)
 FYI Only or Action Required?: FYI only for provider.  Patient was last seen in primary care on 01/20/2024 by Sherlynn Madden, MD.  Called Nurse Triage reporting Back Pain.  Symptoms began several weeks ago.  Interventions attempted: OTC medications: tylenol .  Symptoms are: unchanged.  Triage Disposition: See HCP Within 24 - 72 Hours (Or PCP Triage)  Patient/caregiver understands and will follow disposition?: Yes     Copied from CRM #[id number]. Topic: Clinical - Red Word Triage >> Jun 15, 2024 10:54 AM Miquel SAILOR wrote: Red Word that prompted transfer to Nurse Triage: pain on lower back LT for  2-3 weeks/Getting worse last 2-3 days and hard to sleep Reason for Disposition  [1] SEVERE back pain (e.g., excruciating, unable to do any normal activities) AND [2] not improved 2 hours after pain medicine  Answer Assessment - Initial Assessment Questions 1. ONSET: When did the pain begin? (e.g., minutes, hours, days)     2-3 weeks ago and getting worse 2. LOCATION: Where does it hurt? (upper, mid or lower back)     Lower back on left side 3. SEVERITY: How bad is the pain?  (e.g., Scale 1-10; mild, moderate, or severe)     moderate 4. PATTERN: Is the pain constant? (e.g., yes, no; constant, intermittent)      constant 5. RADIATION: Does the pain shoot into your legs or somewhere else?     no 6. CAUSE:  What do you think is causing the back pain?      unknown 7. BACK OVERUSE:  Any recent lifting of heavy objects, strenuous work or exercise?     States hx surgeries to upper spine 8. MEDICINES: What have you taken so far for the pain? (e.g., nothing, acetaminophen , NSAIDS)     tylenol  9. NEUROLOGIC SYMPTOMS: Do you have any weakness, numbness, or problems with bowel/bladder control?     no 10. OTHER SYMPTOMS: Do you have any other symptoms? (e.g., fever, abdomen pain, burning with urination, blood in urine)       no 11. PREGNANCY: Is there any chance you are  pregnant? When was your last menstrual period?       na  Protocols used: Back Pain-A-AH

## 2024-06-15 NOTE — Telephone Encounter (Signed)
 Noted, will send to PCP as a FYI and to the provider that patient is scheduled to see

## 2024-06-17 ENCOUNTER — Encounter: Payer: Self-pay | Admitting: Family

## 2024-06-17 ENCOUNTER — Other Ambulatory Visit: Payer: Self-pay | Admitting: Podiatry

## 2024-06-17 ENCOUNTER — Ambulatory Visit (INDEPENDENT_AMBULATORY_CARE_PROVIDER_SITE_OTHER): Payer: Self-pay | Admitting: Family

## 2024-06-17 VITALS — BP 138/84 | HR 64 | Temp 97.6°F | Resp 20 | Ht 68.0 in | Wt 148.6 lb

## 2024-06-17 DIAGNOSIS — G8929 Other chronic pain: Secondary | ICD-10-CM

## 2024-06-17 DIAGNOSIS — R109 Unspecified abdominal pain: Secondary | ICD-10-CM | POA: Diagnosis not present

## 2024-06-17 DIAGNOSIS — M25512 Pain in left shoulder: Secondary | ICD-10-CM | POA: Diagnosis not present

## 2024-06-17 DIAGNOSIS — G629 Polyneuropathy, unspecified: Secondary | ICD-10-CM | POA: Diagnosis not present

## 2024-06-17 DIAGNOSIS — Z1211 Encounter for screening for malignant neoplasm of colon: Secondary | ICD-10-CM

## 2024-06-17 LAB — COMPREHENSIVE METABOLIC PANEL WITH GFR
AG Ratio: 1.6 (calc) (ref 1.0–2.5)
ALT: 18 U/L (ref 9–46)
AST: 20 U/L (ref 10–35)
Albumin: 4.1 g/dL (ref 3.6–5.1)
Alkaline phosphatase (APISO): 101 U/L (ref 35–144)
BUN: 11 mg/dL (ref 7–25)
CO2: 29 mmol/L (ref 20–32)
Calcium: 9.6 mg/dL (ref 8.6–10.3)
Chloride: 106 mmol/L (ref 98–110)
Creat: 1.05 mg/dL (ref 0.70–1.35)
Globulin: 2.5 g/dL (ref 1.9–3.7)
Glucose, Bld: 93 mg/dL (ref 65–99)
Potassium: 4.7 mmol/L (ref 3.5–5.3)
Sodium: 140 mmol/L (ref 135–146)
Total Bilirubin: 0.3 mg/dL (ref 0.2–1.2)
Total Protein: 6.6 g/dL (ref 6.1–8.1)
eGFR: 79 mL/min/1.73m2 (ref >=60)

## 2024-06-17 LAB — CBC WITH DIFFERENTIAL/PLATELET
Absolute Lymphocytes: 2759 {cells}/uL (ref 850–3900)
Absolute Monocytes: 611 {cells}/uL (ref 200–950)
Basophils Absolute: 19 {cells}/uL (ref 0–200)
Basophils Relative: 0.3 %
Eosinophils Absolute: 120 {cells}/uL (ref 15–500)
Eosinophils Relative: 1.9 %
HCT: 42.1 % (ref 38.5–50.0)
Hemoglobin: 14.2 g/dL (ref 13.2–17.1)
MCH: 33.1 pg — ABNORMAL HIGH (ref 27.0–33.0)
MCHC: 33.7 g/dL (ref 32.0–36.0)
MCV: 98.1 fL (ref 80.0–100.0)
MPV: 10.2 fL (ref 7.5–12.5)
Monocytes Relative: 9.7 %
Neutro Abs: 2791 {cells}/uL (ref 1500–7800)
Neutrophils Relative %: 44.3 %
Platelets: 225 Thousand/uL (ref 140–400)
RBC: 4.29 Million/uL (ref 4.20–5.80)
RDW: 12.6 % (ref 11.0–15.0)
Total Lymphocyte: 43.8 %
WBC: 6.3 Thousand/uL (ref 3.8–10.8)

## 2024-06-17 LAB — POCT URINALYSIS DIPSTICK
Blood, UA: NEGATIVE
Glucose, UA: NEGATIVE
Leukocytes, UA: NEGATIVE
Nitrite, UA: NEGATIVE
Protein, UA: POSITIVE — AB
Spec Grav, UA: 1.015 (ref 1.010–1.025)
Urobilinogen, UA: 1 U/dL
pH, UA: 6 (ref 5.0–8.0)

## 2024-06-17 MED ORDER — OXYCODONE-ACETAMINOPHEN 5-325 MG PO TABS: 1.0000 | ORAL_TABLET | ORAL | 0 refills | Status: DC | PRN

## 2024-06-17 MED ORDER — GABAPENTIN 300 MG PO CAPS: 300.0000 mg | ORAL_CAPSULE | Freq: Three times a day (TID) | ORAL | 3 refills | Status: DC

## 2024-06-17 MED ORDER — IBUPROFEN 800 MG PO TABS
800.0000 mg | ORAL_TABLET | Freq: Four times a day (QID) | ORAL | 1 refills | Status: DC | PRN
Start: 2024-06-17 — End: 2024-07-16

## 2024-06-17 NOTE — Progress Notes (Signed)
 Provider: Brion Sossamon FNP-C  Sherlynn Madden, MD  Patient Care Team: Sherlynn Madden, MD as PCP - General (Internal Medicine)  Extended Emergency Contact Information Primary Emergency Contact: [facility] Address: [address] [place], [place] [address] [place] Phone: [phone] Relation: Spouse  Code Status:Full Code  Goals of care: Advanced Directive information    06/17/2024    2:53 PM  Advanced Directives  Does Patient Have a Medical Advance Directive? No  Would patient like information on creating a medical advance directive? No - Patient declined     Chief Complaint  Patient presents with   Back Pain    Discussed the use of AI scribe software for clinical note transcription with the patient, who gave verbal consent to proceed.  History of Present Illness   Mark Hampton is a 63 year old male who presents with left flank pain. He is accompanied by his wife.  He has been experiencing left flank pain for about three to four weeks, described as a 'bad cramp' that is tight and hard to describe. The pain has been worsening over time. No recent lifting or pulling activities that could have triggered the pain. No issues with urination, such as frequency, blood in urine, or pain during urination. No nausea or vomiting.  He has a history of spinal surgery, neck surgery with screws, and foot surgery. Following the foot surgery, he describes a sensation of his toe feeling like it's 'busting out of his skin.' He experiences generalized body pain post-surgery and stiffness when sitting for long periods. He has a history of arthritis.  He reports difficulty sleeping due to pain, often waking up around three or four in the morning. He takes oxycodone  for foot pain and is currently out of medication. He also takes gabapentin  and ibuprofen , which he needs refilled.    Past Medical History:  Diagnosis Date   Arthritis    hands    Bilateral carpal tunnel syndrome 03/29/2020   Cervical radiculopathy    Hammertoe of right foot    Spinal stenosis in cervical region    Past Surgical History:  Procedure Laterality Date   ANTERIOR CERVICAL DECOMP/DISCECTOMY FUSION N/A 06/28/2020   Procedure: Anterior Cervical Decompression Fusion - Cervical three-four;  Surgeon: Onetha Kuba, MD;  Location[facility];  Service: Neurosurgery;  Laterality: N/A;   ANTERIOR CERVICAL DECOMP/DISCECTOMY FUSION N/A 11/05/2021   Procedure: Anterior Cervical Discectomy and Fusion Cervical Four-Five/Cervical Five-Six/Cervical Six-Seven with Hardware Removal Cervical Three-Four;  Surgeon: Onetha Kuba, MD;  Location: [facility] OR;  Service: Neurosurgery;  Laterality: N/A;   CAPSULOTOMY METATARSOPHALANGEAL Right 02/02/2024   Procedure: CAPSULOTOMY METATARSOPHALANGEAL RELEASE;  Surgeon: Tobie Franky SQUIBB, DPM;  Location: [facility] ORS;  Service: Orthopedics/Podiatry;  Laterality: Right;   DENTAL SURGERY  08/24/2016   HALLUX FUSION Right 02/02/2024   Procedure: FUSION, JOINT, GREAT TOE;  Surgeon: Tobie Franky SQUIBB, DPM;  Location: [facility] ORS;  Service: Orthopedics/Podiatry;  Laterality: Right;  POPLITEAL BLOCK   HAMMER TOE SURGERY Right 02/02/2024   Procedure: HAMMER TOE CORRECTION 2ND & 3RD;  Surgeon: Tobie Franky SQUIBB, DPM;  Location: [facility] ORS;  Service: Orthopedics/Podiatry;  Laterality: Right;   INCISION AND DRAINAGE Right 08/24/2016   right neck odontogenic abscess   ULNAR NERVE TRANSPOSITION Right 01/07/2023   Procedure: Right carpal tunnel release.  Right ulnar nerve release at the elbow.  Right scaphoid excision and 4 corner fusion with extensor pollicis longus transfer and posterior  interosseous nerve neurectomy;  Surgeon: Camella Fallow, MD;  Location: [facility] OR;  Service: Orthopedics;  Laterality: Right;  2 hrs Block with IV Sedation    No Known Allergies  Outpatient Encounter Medications as of 06/17/2024  Medication Sig   acetaminophen  (TYLENOL ) 500 MG tablet Take 1,000 mg  by mouth every 6 (six) hours as needed for moderate pain.   atorvastatin  (LIPITOR) 10 MG tablet Take 1 tablet (10 mg total) by mouth daily.   Cholecalciferol (VITAMIN D3) 10 MCG (400 UNIT) tablet Take 400 Units by mouth daily with lunch.   Cyanocobalamin (B-12 PO) Take by mouth daily.   nicotine  (NICODERM CQ ) 21 mg/24hr patch Place 1 patch (21 mg total) onto the skin daily.   oxyCODONE -acetaminophen  (PERCOCET) 5-325 MG tablet Take 1 tablet by mouth every 4 (four) hours as needed for severe pain (pain score 7-10).   cetirizine  (ZYRTEC  ALLERGY) 10 MG tablet Take 1 tablet (10 mg total) by mouth daily. (Patient not taking: Reported on 06/17/2024)   famotidine  (PEPCID ) 40 MG tablet Take 1 tablet (40 mg total) by mouth at bedtime. (Patient not taking: Reported on 06/17/2024)   gabapentin  (NEURONTIN ) 300 MG capsule Take 1 capsule (300 mg total) by mouth 3 (three) times daily.   ibuprofen  (ADVIL ) 800 MG tablet Take 1 tablet (800 mg total) by mouth every 6 (six) hours as needed.   KLOXXADO 8 MG/0.1ML LIQD Place into both nostrils. (Patient not taking: Reported on 06/17/2024)   mometasone  (ELOCON ) 0.1 % cream Apply on itchy spots on legs  twice daily (Patient not taking: Reported on 06/17/2024)   oxyCODONE -acetaminophen  (PERCOCET) 5-325 MG tablet Take 1 tablet by mouth every 4 (four) hours as needed for severe pain (pain score 7-10). (Patient not taking: Reported on 06/17/2024)   oxyCODONE -acetaminophen  (PERCOCET) 5-325 MG tablet Take 1 tablet by mouth every 4 (four) hours as needed for severe pain (pain score 7-10). (Patient not taking: Reported on 06/17/2024)   oxyCODONE -acetaminophen  (PERCOCET) 5-325 MG tablet Take 1 tablet by mouth every 4 (four) hours as needed for severe pain (pain score 7-10). (Patient not taking: Reported on 06/17/2024)   oxyCODONE -acetaminophen  (PERCOCET) 5-325 MG tablet Take 1 tablet by mouth every 4 (four) hours as needed for severe pain (pain score 7-10). (Patient not taking: Reported on  06/17/2024)   oxyCODONE -acetaminophen  (PERCOCET) 5-325 MG tablet Take 1 tablet by mouth every 4 (four) hours as needed for severe pain (pain score 7-10). (Patient not taking: Reported on 06/17/2024)   oxyCODONE -acetaminophen  (PERCOCET) 5-325 MG tablet Take 1 tablet by mouth every 4 (four) hours as needed for severe pain (pain score 7-10). (Patient not taking: Reported on 06/17/2024)   vitamin E  180 MG (400 UNITS) capsule Take 400 Units by mouth daily with lunch. (Patient not taking: Reported on 06/17/2024)   [DISCONTINUED] gabapentin  (NEURONTIN ) 300 MG capsule Take 1 capsule (300 mg total) by mouth 3 (three) times daily. (Patient not taking: Reported on 06/17/2024)   [DISCONTINUED] ibuprofen  (ADVIL ) 800 MG tablet TAKE ONE TABLET BY MOUTH EVERY 6 HOURS AS NEEDED (Patient not taking: Reported on 06/17/2024)   No facility-administered encounter medications on file as of 06/17/2024.    Review of Systems  Constitutional:  Negative for appetite change, chills, fatigue, fever and unexpected weight change.  HENT:  Negative for congestion, ear discharge, ear pain, hearing loss, nosebleeds, postnasal drip, rhinorrhea, sinus pressure, sinus pain, sneezing, sore throat and tinnitus.   Eyes:  Negative for pain, discharge, redness, itching and visual disturbance.  Respiratory:  Negative for  cough, chest tightness, shortness of breath and wheezing.   Cardiovascular:  Negative for chest pain, palpitations and leg swelling.  Gastrointestinal:  Negative for abdominal distention, abdominal pain, blood in stool, constipation, diarrhea, nausea and vomiting.  Genitourinary:  Negative for difficulty urinating, dysuria, flank pain, frequency and urgency.  Musculoskeletal:  Positive for arthralgias and back pain. Negative for gait problem, joint swelling, myalgias, neck pain and neck stiffness.       Left flank pain   Skin:  Negative for color change, pallor, rash and wound.  Neurological:  Negative for dizziness, weakness,  light-headedness, numbness and headaches.    Immunization History  Administered Date(s) Administered   Fluad Trivalent(High Dose 65+) 11/04/2023   Influenza,inj,Quad PF,6+ Mos 11/16/2019, 12/06/2022   PFIZER(Purple Top)SARS-COV-2 Vaccination 01/22/2020, 02/15/2020   Tdap 11/16/2019   Pertinent  Health Maintenance Due  Topic Date Due   Colonoscopy  Never done   INFLUENZA VACCINE  01/18/2025 (Originally 05/21/2024)      11/04/2023    2:52 PM 11/04/2023    3:16 PM 12/30/2023    1:39 PM 12/30/2023    1:48 PM 06/17/2024    2:53 PM  Fall Risk  Falls in the past year? 0 0 0 0 0  Was there an injury with Fall? 0 0 0 1 0  Fall Risk Category Calculator 0 0 0 1 0  Patient at Risk for Falls Due to No Fall Risks  No Fall Risks  No Fall Risks  Fall risk Follow up Falls evaluation completed;Education provided;Falls prevention discussed  Falls evaluation completed;Education provided;Falls prevention discussed  Falls evaluation completed   Functional Status Survey:    Vitals:   06/17/24 1500  BP: 138/84  Pulse: 64  Resp: 20  Temp: 97.6 F (36.4 C)  SpO2: 97%  Weight: 148 lb 9.6 oz (67.4 kg)  Height: 5' 8 (1.727 m)   Body mass index is 22.59 kg/m. Physical Exam  VITALS: T- 97.6, P- 64, BP- 138/84, SaO2- 97% MEASUREMENTS: Weight- 148.6. GENERAL: Alert, cooperative, well developed, no acute distress. HEENT: Normocephalic, normal oropharynx, moist mucous membranes. CHEST: Clear to auscultation bilaterally, no wheezes, rhonchi, or crackles. CARDIOVASCULAR: Normal heart rate and rhythm, S1 and S2 normal without murmurs. ABDOMEN: Soft, non-tender, non-distended, without organomegaly, normal bowel sounds, no swelling or redness, left flank tender to percussion but no tenderness in the right flank area. EXTREMITIES: No cyanosis or edema. NEUROLOGICAL: Cranial nerves grossly intact, moves all extremities without gross motor or sensory deficit.   Labs reviewed: Recent Labs    01/20/24 1449  06/17/24 1540  NA 138 140  K 4.5 4.7  CL 108 106  CO2 24 29  GLUCOSE 84 93  BUN 12 11  CREATININE 1.04 1.05  CALCIUM  9.4 9.6   Recent Labs    06/17/24 1540  AST 20  ALT 18  BILITOT 0.3  PROT 6.6   Recent Labs    01/20/24 1449 06/17/24 1540  WBC 6.4 6.3  NEUTROABS  --  2,791  HGB 14.6 14.2  HCT 43.3 42.1  MCV 95.6 98.1  PLT 240 225   Lab Results  Component Value Date   TSH 0.912 03/07/2020   Lab Results  Component Value Date   HGBA1C 5.3 03/07/2020   Lab Results  Component Value Date   CHOL 174 12/06/2022   HDL 74 12/06/2022   LDLCALC 79 12/06/2022   TRIG 116 12/06/2022   CHOLHDL 2.4 12/06/2022    Significant Diagnostic Results in last 30 days:  No  results found.  Assessment/Plan  Left flank pain Left flank pain for 3-4 weeks, described as a bad cramp, with no associated nausea, vomiting, or urinary symptoms. Differential diagnosis includes kidney stones, muscular strain, or infection. Physical exam shows tenderness in the left flank area without swelling or redness. Urinalysis negative for infection markers, but culture pending. - Order urinalysis culture - Order CBC and chemistry panel - Consider ultrasound if pain persists and culture is negative - Advise to monitor for nausea, vomiting, fever, or hematuria and seek care if these occur  Chronic pain syndrome Chronic pain syndrome managed with oxycodone , gabapentin , and ibuprofen . Pain persists despite medication, affecting sleep and daily activities. - Refill gabapentin  and ibuprofen  prescriptions - Continue current pain management regimen - Advise to follow up with pain management specialist  Polyneuropathy - Refill gabapentin  prescription - Continue current management plan  General Health Maintenance Colorectal cancer screening discussed. Options include colonoscopy and Cologuard. He prefers colonoscopy. - Refer for colonoscopy  Follow-up Follow-up plans discussed for ongoing management  and evaluation of current symptoms. - Schedule follow-up appointment in two weeks - Advise to reschedule missed appointment with specialist   Family/ staff Communication: Reviewed plan of care with patient and wife verbalized understanding   Labs/tests ordered:  - CBC with Differential/Platelet - CMP with eGFR([facility]) - POC Urinalysis Dipstick  - Urine Culture  Next Appointment: Return in about 2 weeks (around 07/01/2024) for medical mangement of chronic issues with PCP .   Total time: 35 minutes. Greater than 50% of total time spent doing patient education regarding left flank pain,chronic pain, syndrome,health maintenance including symptom/medication management.   Roxan JAYSON Plough, NP

## 2024-06-18 LAB — URINE CULTURE
MICRO NUMBER:: 16898085
Result:: NO GROWTH
SPECIMEN QUALITY:: ADEQUATE

## 2024-06-20 ENCOUNTER — Ambulatory Visit: Payer: Self-pay | Admitting: Family

## 2024-06-20 DIAGNOSIS — R109 Unspecified abdominal pain: Secondary | ICD-10-CM

## 2024-07-02 ENCOUNTER — Ambulatory Visit
Admission: RE | Admit: 2024-07-02 | Discharge: 2024-07-02 | Disposition: A | Discharge status: Home / Self Care | Source: Ambulatory Visit | Attending: Family | Admitting: Family

## 2024-07-06 ENCOUNTER — Ambulatory Visit (INDEPENDENT_AMBULATORY_CARE_PROVIDER_SITE_OTHER): Admitting: Sports Medicine

## 2024-07-06 ENCOUNTER — Encounter: Payer: Self-pay | Admitting: Sports Medicine

## 2024-07-06 VITALS — BP 124/70 | HR 57 | Temp 98.6°F | Ht 68.0 in | Wt 146.2 lb

## 2024-07-06 DIAGNOSIS — R0781 Pleurodynia: Secondary | ICD-10-CM | POA: Diagnosis not present

## 2024-07-06 NOTE — Progress Notes (Unsigned)
 Careteam: Patient Care Team: Sherlynn Madden, MD as PCP - General (Internal Medicine)  PLACE OF SERVICE:  [facility]  Advanced Directive information    No Known Allergies  Chief Complaint  Patient presents with   Medical Management of Chronic Issues    Patient following up from pain on the left side of his body. Patient stated since the last visit it hurts a little worse.      Discussed the use of AI scribe software for clinical note transcription with the patient, who gave verbal consent to proceed.  History of Present Illness Mark Hampton is a 63 year old male who presents with persistent left-sided rib cage pain.  He has been experiencing persistent pain under his left rib cage for several months, described as a twisting sensation with a severity of 8 out of 10. The pain is constant, does not radiate, and is somewhat alleviated by oxycodone , which he takes three times a day. The pain does not worsen with deep breathing and does not interfere with walking, but it is particularly bothersome at night. Lying on the affected side provides some relief, while lying on the opposite side exacerbates the pain.  No history of falls or injuries that could have caused the pain. No cough, shortness of breath, fever, chills, night sweats, weight loss, chest pain, heartburn, urinary problems, or blood in the urine. His appetite remains good, and his weight has been stable around 146-148 pounds.  He smokes five to six cigarettes a day and drinks beer, consuming about a six-pack per week. He denies any drug use. He walks as much as he can, although his activity is limited by his foot condition. He had foot surgery in April and follows with podiatry.   Location- post left rib cage  Duration - few months  Aggravating factors-no preceeding fall or trauma Describes as twisting type of pain , reports 8/10 pain  Relieving factors- resting Medications tried- oxycodone  Physical therapy-  none Mobility- does not limit him from walking but states it  hurts when he lays on other side Falls- no   Pt is a smoker, 5-6 cigarettes / day Drinks beer 2-3 /   / week   Denies fevers, chills, night sweats  Reports good appetite Weight stable  Review of Systems:  Review of Systems  Constitutional:  Negative for chills and fever.  HENT:  Negative for congestion and sore throat.   Respiratory:  Negative for cough, sputum production and shortness of breath.   Cardiovascular:  Negative for chest pain, palpitations and leg swelling.  Gastrointestinal:  Negative for abdominal pain, heartburn and nausea.  Genitourinary:  Negative for dysuria, frequency and hematuria.  Musculoskeletal:  Positive for back pain. Negative for falls.  Neurological:  Negative for dizziness.   Negative unless indicated in HPI.   Past Medical History:  Diagnosis Date   Arthritis    hands   Bilateral carpal tunnel syndrome 03/29/2020   Cervical radiculopathy    Hammertoe of right foot    Spinal stenosis in cervical region    Past Surgical History:  Procedure Laterality Date   ANTERIOR CERVICAL DECOMP/DISCECTOMY FUSION N/A 06/28/2020   Procedure: Anterior Cervical Decompression Fusion - Cervical three-four;  Surgeon: Onetha Kuba, MD;  Location: [facility] OR;  Service: Neurosurgery;  Laterality: N/A;   ANTERIOR CERVICAL DECOMP/DISCECTOMY FUSION N/A 11/05/2021   Procedure: Anterior Cervical Discectomy and Fusion Cervical Four-Five/Cervical Five-Six/Cervical Six-Seven with Hardware Removal Cervical Three-Four;  Surgeon: Onetha Kuba, MD;  Location: [facility] OR;  Service: Neurosurgery;  Laterality: N/A;   CAPSULOTOMY METATARSOPHALANGEAL Right 02/02/2024   Procedure: CAPSULOTOMY METATARSOPHALANGEAL RELEASE;  Surgeon: Tobie Franky SQUIBB, DPM;  Location: [facility] ORS;  Service: Orthopedics/Podiatry;  Laterality: Right;   DENTAL SURGERY  08/24/2016   HALLUX FUSION Right 02/02/2024   Procedure: FUSION, JOINT, GREAT TOE;  Surgeon: Tobie Franky SQUIBB, DPM;  Location: [facility] ORS;  Service: Orthopedics/Podiatry;  Laterality: Right;  POPLITEAL BLOCK   HAMMER TOE SURGERY Right 02/02/2024   Procedure: HAMMER TOE CORRECTION 2ND & 3RD;  Surgeon: Tobie Franky SQUIBB, DPM;  Location: [facility] ORS;  Service: Orthopedics/Podiatry;  Laterality: Right;   INCISION AND DRAINAGE Right 08/24/2016   right neck odontogenic abscess   ULNAR NERVE TRANSPOSITION Right 01/07/2023   Procedure: Right carpal tunnel release.  Right ulnar nerve release at the elbow.  Right scaphoid excision and 4 corner fusion with extensor pollicis longus transfer and posterior interosseous nerve neurectomy;  Surgeon: Camella Fallow, MD;  Location: [facility] OR;  Service: Orthopedics;  Laterality: Right;  2 hrs Block with IV Sedation   Social History:   reports that he has been smoking cigarettes. He has a 6.3 pack-year smoking history. He has never used smokeless tobacco. He reports current alcohol use of about 6.0 standard drinks of alcohol per week. He reports that he does not use drugs.  Family History  Problem Relation Age of Onset   Colon cancer Neg Hx    Colon polyps Neg Hx    Esophageal cancer Neg Hx    Rectal cancer Neg Hx    Stomach cancer Neg Hx     Medications: Patient's Medications  New Prescriptions   No medications on file  Previous Medications   ACETAMINOPHEN  (TYLENOL ) 500 MG TABLET    Take 1,000 mg by mouth every 6 (six) hours as needed for moderate pain.   ATORVASTATIN  (LIPITOR) 10 MG TABLET    Take 1 tablet (10 mg total) by mouth daily.   CHOLECALCIFEROL (VITAMIN D3) 10 MCG (400 UNIT) TABLET    Take 400 Units by mouth daily with lunch.   CYANOCOBALAMIN (B-12 PO)    Take by mouth daily.   GABAPENTIN  (NEURONTIN ) 300 MG CAPSULE    Take 1 capsule (300 mg total) by mouth 3 (three) times daily.   IBUPROFEN  (ADVIL ) 800 MG TABLET    Take 1 tablet (800 mg total) by mouth every 6 (six) hours as needed.   KLOXXADO 8 MG/0.1ML LIQD    Place into both nostrils.   NICOTINE   (NICODERM CQ ) 21 MG/24HR PATCH    Place 1 patch (21 mg total) onto the skin daily.   OXYCODONE -ACETAMINOPHEN  (PERCOCET) 5-325 MG TABLET    Take 1 tablet by mouth every 4 (four) hours as needed for severe pain (pain score 7-10).   OXYCODONE -ACETAMINOPHEN  (PERCOCET) 5-325 MG TABLET    Take 1 tablet by mouth every 4 (four) hours as needed for severe pain (pain score 7-10).   OXYCODONE -ACETAMINOPHEN  (PERCOCET) 5-325 MG TABLET    Take 1 tablet by mouth every 4 (four) hours as needed for severe pain (pain score 7-10).   OXYCODONE -ACETAMINOPHEN  (PERCOCET) 5-325 MG TABLET    Take 1 tablet by mouth every 4 (four) hours as needed for severe pain (pain score 7-10).   OXYCODONE -ACETAMINOPHEN  (PERCOCET) 5-325 MG TABLET    Take 1 tablet by mouth every 4 (four) hours as needed for severe pain (pain score 7-10).   OXYCODONE -ACETAMINOPHEN  (PERCOCET) 5-325 MG TABLET    Take 1 tablet by mouth every 4 (four) hours  as needed for severe pain (pain score 7-10).   OXYCODONE -ACETAMINOPHEN  (PERCOCET) 5-325 MG TABLET    Take 1 tablet by mouth every 4 (four) hours as needed for severe pain (pain score 7-10).  Modified Medications   No medications on file  Discontinued Medications   No medications on file    Physical Exam: Vitals:   07/06/24 1454  BP: 124/70  Pulse: (!) 57  Temp: 98.6 F (37 C)  SpO2: 95%  Weight: 146 lb 3.2 oz (66.3 kg)  Height: 5' 8 (1.727 m)   Body mass index is 22.23 kg/m. BP Readings from Last 3 Encounters:  07/06/24 124/70  06/17/24 138/84  02/20/24 (!) 148/81   Wt Readings from Last 3 Encounters:  07/06/24 146 lb 3.2 oz (66.3 kg)  06/17/24 148 lb 9.6 oz (67.4 kg)  02/02/24 140 lb (63.5 kg)    Physical Exam Constitutional:      Appearance: Normal appearance.  HENT:     Head: Normocephalic and atraumatic.  Cardiovascular:     Rate and Rhythm: Normal rate and regular rhythm.     Pulses: Normal pulses.     Heart sounds: Normal heart sounds.  Pulmonary:     Effort: No  respiratory distress.     Breath sounds: No stridor. No wheezing or rales.  Abdominal:     General: Bowel sounds are normal. There is no distension.     Palpations: Abdomen is soft.     Tenderness: There is no abdominal tenderness. There is no guarding.  Musculoskeletal:        General: No swelling.     Comments: Tenderness below left posterior rib cage    Neurological:     Mental Status: He is alert. Mental status is at baseline.     Motor: No weakness.     Labs reviewed: Basic Metabolic Panel: Recent Labs    01/20/24 1449 06/17/24 1540  NA 138 140  K 4.5 4.7  CL 108 106  CO2 24 29  GLUCOSE 84 93  BUN 12 11  CREATININE 1.04 1.05  CALCIUM  9.4 9.6   Liver Function Tests: Recent Labs    06/17/24 1540  AST 20  ALT 18  BILITOT 0.3  PROT 6.6   No results for input(s): LIPASE, AMYLASE in the last 8760 hours. No results for input(s): AMMONIA in the last 8760 hours. CBC: Recent Labs    01/20/24 1449 06/17/24 1540  WBC 6.4 6.3  NEUTROABS  --  2,791  HGB 14.6 14.2  HCT 43.3 42.1  MCV 95.6 98.1  PLT 240 225   Lipid Panel: No results for input(s): CHOL, HDL, LDLCALC, TRIG, CHOLHDL, LDLDIRECT in the last 8760 hours. TSH: No results for input(s): TSH in the last 8760 hours. A1C: Lab Results  Component Value Date   HGBA1C 5.3 03/07/2020    Assessment and Plan Assessment & Plan   1. Rib pain on left side (Primary) Pt c/o Left sided lower rib pain  No h/o trauma Tenderness on palpation  Denies pain with deep breaths O2 sat  95% on RA Will get x ray rib   Instructed to use lidocaine  patch  Take tylenol  650 mg q6 prn  - DG Ribs Unilateral Left; Future

## 2024-07-07 ENCOUNTER — Encounter: Payer: Self-pay | Admitting: Sports Medicine

## 2024-07-14 ENCOUNTER — Other Ambulatory Visit: Payer: Self-pay | Admitting: Podiatry

## 2024-07-15 ENCOUNTER — Telehealth: Payer: Self-pay | Admitting: Lab

## 2024-07-15 NOTE — Telephone Encounter (Signed)
 Left message requesting call back to discuss need for refill further. Last in office visit was 03/24/2024.

## 2024-07-15 NOTE — Telephone Encounter (Signed)
 DOS 02/02/24

## 2024-07-15 NOTE — Telephone Encounter (Signed)
 Patient is calling states had requested refill yesterday and nothing had been called in.

## 2024-07-16 ENCOUNTER — Ambulatory Visit
Admission: RE | Admit: 2024-07-16 | Discharge: 2024-07-16 | Disposition: A | Discharge status: Home / Self Care | Source: Ambulatory Visit | Attending: Sports Medicine | Admitting: Sports Medicine

## 2024-07-16 ENCOUNTER — Other Ambulatory Visit: Payer: Self-pay | Admitting: Family

## 2024-07-16 DIAGNOSIS — R0781 Pleurodynia: Secondary | ICD-10-CM

## 2024-07-16 DIAGNOSIS — G8929 Other chronic pain: Secondary | ICD-10-CM

## 2024-07-16 NOTE — Telephone Encounter (Signed)
 Call to voicemail. Requested call back.

## 2024-07-19 NOTE — Telephone Encounter (Signed)
 Called twice with no answer. Denying request for refill until patient calls office back.

## 2024-07-22 ENCOUNTER — Ambulatory Visit: Payer: Self-pay | Admitting: Sports Medicine

## 2024-07-22 NOTE — Telephone Encounter (Signed)
 IMPRESSION: 1. No acute displaced left rib fracture. Please note, nondisplaced rib fractures may be occult on radiograph. 2. No acute cardiopulmonary abnormality.

## 2024-07-28 ENCOUNTER — Ambulatory Visit: Payer: Self-pay

## 2024-07-28 NOTE — Telephone Encounter (Signed)
 Message routed to Roxan Plough, NP. Patient is scheduled to see you tomorrow 07/29/2024. Please advise on patient.

## 2024-07-28 NOTE — Telephone Encounter (Signed)
 Noted, and MyChart message sent to patient as FYI

## 2024-07-28 NOTE — Telephone Encounter (Signed)
 Will evaluate symptoms during visit tomorrow but recommend the ED if symptoms worsen

## 2024-07-28 NOTE — Telephone Encounter (Signed)
 FYI Only or Action Required?: FYI only for provider.  Patient was last seen in primary care on 07/06/2024 by Sherlynn Madden, MD.  Called Nurse Triage reporting Chest Pain.  Symptoms began several months ago.  Interventions attempted: Prescription medications: Ibuprofen .  Symptoms are: gradually worsening.  Triage Disposition: See PCP When Office is Open (Within 3 Days)  Patient/caregiver understands and will follow disposition?: Yes  Copied from CRM #[id number]. Topic: Clinical - Red Word Triage >> Jul 28, 2024 10:37 AM Mark Hampton wrote: Red Word that prompted transfer to Nurse Triage: patient experiencing worsen left side pain below ribs , rate pain 8    ----------------------------------------------------------------------- From previous Reason for Contact - Scheduling: Patient/patient representative is calling to schedule an appointment. Refer to attachments for appointment information. Reason for Disposition  [1] Chest pain(s) lasting a few seconds AND [2] persists > 3 days  Answer Assessment - Initial Assessment Questions 1. LOCATION: Where does it hurt?       Left side below ribs  2. RADIATION: Does the pain go anywhere else? (e.g., into neck, jaw, arms, back)     No  3. ONSET: When did the chest pain begin? (Minutes, hours or days)      Approximately 2 month  4. PATTERN: Does the pain come and go, or has it been constant since it started?  Does it get worse with exertion?      Getting worse, has trouble sleeping  5. DURATION: How long does it last (e.g., seconds, minutes, hours)     Hours  6. SEVERITY: How bad is the pain?  (e.g., Scale 1-10; mild, moderate, or severe)     8/10 pain  7. CARDIAC RISK FACTORS: Do you have any history of heart problems or risk factors for heart disease? (e.g., angina, prior heart attack; diabetes, high blood pressure, high cholesterol, smoker, or strong family history of heart disease)     No  8. PULMONARY RISK  FACTORS: Do you have any history of lung disease?  (e.g., blood clots in lung, asthma, emphysema, birth control pills)     No  9. CAUSE: What do you think is causing the chest pain?     Unsure of cause, had chest xray done and rulled out rib fracture  10. OTHER SYMPTOMS: Do you have any other symptoms? (e.g., dizziness, nausea, vomiting, sweating, fever, difficulty breathing, cough)       No  Protocols used: Chest Pain-A-AH

## 2024-07-29 ENCOUNTER — Encounter: Payer: Self-pay | Admitting: Family

## 2024-07-29 ENCOUNTER — Ambulatory Visit (INDEPENDENT_AMBULATORY_CARE_PROVIDER_SITE_OTHER): Admitting: Family

## 2024-07-29 VITALS — BP 132/78 | HR 86 | Temp 98.1°F | Resp 20 | Ht 68.0 in | Wt 148.2 lb

## 2024-07-29 DIAGNOSIS — Z87891 Personal history of nicotine dependence: Secondary | ICD-10-CM

## 2024-07-29 DIAGNOSIS — Z125 Encounter for screening for malignant neoplasm of prostate: Secondary | ICD-10-CM

## 2024-07-29 DIAGNOSIS — Z23 Encounter for immunization: Secondary | ICD-10-CM

## 2024-07-29 DIAGNOSIS — M25512 Pain in left shoulder: Secondary | ICD-10-CM

## 2024-07-29 DIAGNOSIS — G8929 Other chronic pain: Secondary | ICD-10-CM | POA: Diagnosis not present

## 2024-07-29 DIAGNOSIS — M5412 Radiculopathy, cervical region: Secondary | ICD-10-CM | POA: Insufficient documentation

## 2024-07-29 DIAGNOSIS — R10A2 Flank pain, left side: Secondary | ICD-10-CM | POA: Diagnosis not present

## 2024-07-29 MED ORDER — IBUPROFEN 800 MG PO TABS
800.0000 mg | ORAL_TABLET | Freq: Four times a day (QID) | ORAL | 3 refills | Status: AC | PRN
Start: 2024-07-29 — End: ?

## 2024-07-29 NOTE — Progress Notes (Signed)
 Provider: Gissella Niblack FNP-C  Sherlynn Madden, MD  Patient Care Team: Sherlynn Madden, MD as PCP - General (Internal Medicine)  Extended Emergency Contact Information Primary Emergency Contact: [facility] Address: [address] [place], [place] [address] [place] Mobile Phone: [phone] Relation: Spouse  Code Status:  Full Code  Goals of care: Advanced Directive information    06/17/2024    2:53 PM  Advanced Directives  Does Patient Have a Medical Advance Directive? No  Would patient like information on creating a medical advance directive? No - Patient declined     Chief Complaint  Patient presents with   Acute Visit    Rib pain.    Discussed the use of AI scribe software for clinical note transcription with the patient, who gave verbal consent to proceed.  History of Present Illness   Mark Hampton is a 63 year old male who presents with worsening left-sided rib pain for over two months.  He has been experiencing worsening rib pain on the left side for over two months. The pain is localized to the left side and does not radiate to the back. It is described as constant, with increased intensity in the evenings, affecting his ability to sleep as he often has to sit up. Movement, particularly twisting, exacerbates the pain. No history of trauma or fall that could have caused the pain.  He was previously evaluated on August 05, 2024, for the same issue. An x-ray performed on July 16, 2024, showed no acute displaced rib fracture and no acute cardiopulmonary abnormalities. He has been taking Tylenol  650 mg every six hours as needed, but it is not effective in managing the pain. He has not yet used the lidocaine  patch.  No difficulty breathing, swelling on the rib, or issues with urination. He sometimes gets up once or twice at night to urinate.  He smokes occasionally and is attempting to quit using a nicotine  patch. He  drinks about a bottle and a half of water daily, which is less than recommended.  He has a family history of prostate cancer, as his brother recently passed away from the disease at age 29.    Past Medical History:  Diagnosis Date   Arthritis    hands   Bilateral carpal tunnel syndrome 03/29/2020   Cervical radiculopathy    Hammertoe of right foot    Spinal stenosis in cervical region    Past Surgical History:  Procedure Laterality Date   ANTERIOR CERVICAL DECOMP/DISCECTOMY FUSION N/A 06/28/2020   Procedure: Anterior Cervical Decompression Fusion - Cervical three-four;  Surgeon: Onetha Kuba, MD;  Location: [facility] OR;  Service: Neurosurgery;  Laterality: N/A;   ANTERIOR CERVICAL DECOMP/DISCECTOMY FUSION N/A 11/05/2021   Procedure: Anterior Cervical Discectomy and Fusion Cervical Four-Five/Cervical Five-Six/Cervical Six-Seven with Hardware Removal Cervical Three-Four;  Surgeon: Onetha Kuba, MD;  Location: [facility] OR;  Service: Neurosurgery;  Laterality: N/A;   CAPSULOTOMY METATARSOPHALANGEAL Right 02/02/2024   Procedure: CAPSULOTOMY METATARSOPHALANGEAL RELEASE;  Surgeon: Tobie Franky SQUIBB, DPM;  Location: [facility] ORS;  Service: Orthopedics/Podiatry;  Laterality: Right;   DENTAL SURGERY  08/24/2016   HALLUX FUSION Right 02/02/2024   Procedure: FUSION, JOINT, GREAT TOE;  Surgeon: Tobie Franky SQUIBB, DPM;  Location: [facility] ORS;  Service: Orthopedics/Podiatry;  Laterality: Right;  POPLITEAL BLOCK   HAMMER TOE SURGERY Right 02/02/2024   Procedure: HAMMER TOE CORRECTION 2ND & 3RD;  Surgeon: Tobie Franky SQUIBB, DPM;  Location: [facility] ORS;  Service:  Orthopedics/Podiatry;  Laterality: Right;   INCISION AND DRAINAGE Right 08/24/2016   right neck odontogenic abscess   ULNAR NERVE TRANSPOSITION Right 01/07/2023   Procedure: Right carpal tunnel release.  Right ulnar nerve release at the elbow.  Right scaphoid excision and 4 corner fusion with extensor pollicis longus transfer and posterior interosseous nerve neurectomy;  Surgeon:  Camella Fallow, MD;  Location: [facility] OR;  Service: Orthopedics;  Laterality: Right;  2 hrs Block with IV Sedation    No Known Allergies  Outpatient Encounter Medications as of 07/29/2024  Medication Sig   acetaminophen  (TYLENOL ) 500 MG tablet Take 1,000 mg by mouth every 6 (six) hours as needed for moderate pain.   atorvastatin  (LIPITOR) 10 MG tablet Take 1 tablet (10 mg total) by mouth daily.   Cholecalciferol (VITAMIN D3) 10 MCG (400 UNIT) tablet Take 400 Units by mouth daily with lunch.   Cyanocobalamin (B-12 PO) Take by mouth daily.   gabapentin  (NEURONTIN ) 300 MG capsule Take 1 capsule (300 mg total) by mouth 3 (three) times daily.   nicotine  (NICODERM CQ ) 21 mg/24hr patch Place 1 patch (21 mg total) onto the skin daily.   [DISCONTINUED] ibuprofen  (ADVIL ) 800 MG tablet TAKE ONE TABLET BY MOUTH EVERY 6 HOURS AS NEEDED   ibuprofen  (ADVIL ) 800 MG tablet Take 1 tablet (800 mg total) by mouth every 6 (six) hours as needed.   oxyCODONE -acetaminophen  (PERCOCET) 5-325 MG tablet Take 1 tablet by mouth every 4 (four) hours as needed for severe pain (pain score 7-10). (Patient not taking: Reported on 07/29/2024)   No facility-administered encounter medications on file as of 07/29/2024.    Review of Systems  Constitutional:  Negative for appetite change, chills, fatigue, fever and unexpected weight change.  Eyes:  Negative for pain, discharge, redness, itching and visual disturbance.  Respiratory:  Negative for cough, chest tightness, shortness of breath and wheezing.        Right side and flank pain   Cardiovascular:  Positive for leg swelling. Negative for chest pain and palpitations.  Gastrointestinal:  Negative for abdominal distention, abdominal pain, constipation, diarrhea, nausea and vomiting.  Genitourinary:  Negative for difficulty urinating, dysuria, flank pain, frequency and urgency.  Musculoskeletal:  Negative for arthralgias, back pain, gait problem, joint swelling, myalgias, neck pain  and neck stiffness.       Flank pain   Skin:  Negative for color change, pallor and rash.  Neurological:  Negative for dizziness, weakness, light-headedness, numbness and headaches.  Psychiatric/Behavioral: Negative.      Immunization History  Administered Date(s) Administered   Fluad Trivalent(High Dose 65+) 11/04/2023   Influenza, Seasonal, Injecte, Preservative Fre 07/29/2024   Influenza,inj,Quad PF,6+ Mos 11/16/2019, 12/06/2022   PFIZER(Purple Top)SARS-COV-2 Vaccination 01/22/2020, 02/15/2020   PNEUMOCOCCAL CONJUGATE-20 07/29/2024   Tdap 11/16/2019   Pertinent  Health Maintenance Due  Topic Date Due   Colonoscopy  Never done   Influenza Vaccine  Completed      11/04/2023    3:16 PM 12/30/2023    1:39 PM 12/30/2023    1:48 PM 06/17/2024    2:53 PM 07/06/2024    3:22 PM  Fall Risk  Falls in the past year? 0 0 0 0 0  Was there an injury with Fall? 0 0 1 0 1  Fall Risk Category Calculator 0 0 1 0 1  Patient at Risk for Falls Due to  No Fall Risks  No Fall Risks   Fall risk Follow up  Falls evaluation completed;Education provided;Falls prevention discussed  Falls evaluation completed    Functional Status Survey:    Vitals:   07/29/24 1450  BP: 132/78  Pulse: 86  Resp: 20  Temp: 98.1 F (36.7 C)  SpO2: 97%  Weight: 148 lb 3.2 oz (67.2 kg)  Height: 5' 8 (1.727 m)   Body mass index is 22.53 kg/m. Physical Exam VITALS: T- 98.1, P- 86, BP- 132/78, SaO2- 97% MEASUREMENTS: Weight- 148.2. GENERAL: Alert, cooperative, well developed, well nourished, no acute distress HEENT: Normocephalic, normal oropharynx, moist mucous membranes CHEST: Clear to auscultation bilaterally, no wheezes, rhonchi, or crackles, no chest wall tenderness, no pleural rub CARDIOVASCULAR: Normal heart rate and rhythm, S1 and S2 normal without murmurs ABDOMEN: Soft, non-tender, non-distended, without organomegaly, normal bowel sounds MSK: CVA tender to palpation  EXTREMITIES: No cyanosis or  edema NEUROLOGICAL: Cranial nerves grossly intact, moves all extremities without gross motor or sensory deficit    Labs reviewed: Recent Labs    01/20/24 1449 06/17/24 1540  NA 138 140  K 4.5 4.7  CL 108 106  CO2 24 29  GLUCOSE 84 93  BUN 12 11  CREATININE 1.04 1.05  CALCIUM  9.4 9.6   Recent Labs    06/17/24 1540  AST 20  ALT 18  BILITOT 0.3  PROT 6.6   Recent Labs    01/20/24 1449 06/17/24 1540  WBC 6.4 6.3  NEUTROABS  --  2,791  HGB 14.6 14.2  HCT 43.3 42.1  MCV 95.6 98.1  PLT 240 225   Lab Results  Component Value Date   TSH 0.912 03/07/2020   Lab Results  Component Value Date   HGBA1C 5.3 03/07/2020   Lab Results  Component Value Date   CHOL 174 12/06/2022   HDL 74 12/06/2022   LDLCALC 79 12/06/2022   TRIG 116 12/06/2022   CHOLHDL 2.4 12/06/2022    Significant Diagnostic Results in last 30 days:  DG Ribs Unilateral Left Result Date: 07/22/2024 CLINICAL DATA:  rib pain Left Pt states left sided rib pain x 3-4 months Pt states no injuries or falls EXAM: LEFT RIBS - 2 VIEW COMPARISON:  CT chest 11/25/2023 FINDINGS: The heart and mediastinal contours are within normal limits. No focal consolidation. No pulmonary edema. No pleural effusion. No pneumothorax. No acute displaced fracture or other bone lesions are seen involving the left ribs. IMPRESSION: 1. No acute displaced left rib fracture. Please note, nondisplaced rib fractures may be occult on radiograph. 2. No acute cardiopulmonary abnormality. Electronically Signed   By: Morgane  Naveau M.D.   On: 07/22/2024 01:13   US  RENAL Result Date: 07/03/2024 CLINICAL DATA:  Initial evaluation for right flank pain for 2 months. EXAM: RENAL / URINARY TRACT ULTRASOUND COMPLETE COMPARISON:  Comparison made with prior MRI of the lumbar spine from 05/14/2020 FINDINGS: Right Kidney: Renal measurements: 10.2 x 3.7 x 4.1 cm = volume: 81.0 mL. Mildly increased echogenicity within the renal parenchyma. No nephrolithiasis or  hydronephrosis. 2.5 x 2.5 x 1.7 cm exophytic cyst seen arising from the upper pole. Minimal internal complexity without vascularity. Left Kidney: Renal measurements: 9.6 x 5.6 x 4.1 cm = volume: 115.0 mL. Increased echogenicity within the renal parenchyma. No nephrolithiasis or hydronephrosis. No focal renal mass. Bladder: Appears normal for degree of bladder distention. Both jets are seen. Other: None. IMPRESSION: 1. Increased echogenicity within the renal parenchyma, compatible with medical renal disease. 2. No nephrolithiasis or hydronephrosis. 3. 2.5 cm minimally complex exophytic cyst arising from the upper pole of the right kidney. While this  is favored benign, a follow-up ultrasound in 6 months is recommended to document stability. Electronically Signed   By: Morene Hoard M.D.   On: 07/03/2024 00:28    Assessment/Plan  Left-sided rib pain Chronic left-sided rib pain for over two months, worsening in severity. No history of trauma. Pain is localized and does not radiate to the back. X-ray showed no acute displaced rib fracture and no acute cardiopulmonary abnormality. Differential diagnosis includes musculoskeletal pain and possible kidney involvement due to anatomical location. Tenderness noted on examination, prompting further investigation for potential kidney issues. - Order CT scan to evaluate kidney involvement. - Prescribe ibuprofen  for pain management. - Advise use of over-the-counter lidocaine  patch for pain relief. - Encourage increased water intake to support kidney function.  Prostate health Family history of prostate cancer; brother passed away from prostate cancer at age 65. No recent PSA test on record. - Order PSA test to evaluate prostate health. - Provide option to take urine specimen cup home for convenience.  Tobacco use Continues to smoke, though he reports using nicotine  patches to aid in cessation. Smoking cessation is important for overall health  improvement. - Encourage continued use of nicotine  patches to aid smoking cessation.  Family/ staff Communication: Reviewed plan of care with patient verbalized understanding   Labs/tests ordered:  - PSA total and Free - Urine culture  - Renal  abdomen CT scan W/WO  Next Appointment: No follow-ups on file.   Total time: 20 minutes. Greater than 50% of total time spent doing patient education regarding Smoker,left side pain,PSA screening ,health maintenance including symptom/medication management.   Roxan JAYSON Plough, NP

## 2024-07-29 NOTE — Patient Instructions (Signed)
 1.Stop at check out and schedule Annual Wellness Visit.

## 2024-07-30 ENCOUNTER — Ambulatory Visit (INDEPENDENT_AMBULATORY_CARE_PROVIDER_SITE_OTHER): Admitting: Podiatry

## 2024-07-30 DIAGNOSIS — M19071 Primary osteoarthritis, right ankle and foot: Secondary | ICD-10-CM | POA: Diagnosis not present

## 2024-07-30 DIAGNOSIS — M2041 Other hammer toe(s) (acquired), right foot: Secondary | ICD-10-CM | POA: Diagnosis not present

## 2024-07-30 MED ORDER — OXYCODONE-ACETAMINOPHEN 5-325 MG PO TABS: 1.0000 | ORAL_TABLET | ORAL | 0 refills | Status: DC | PRN

## 2024-07-30 NOTE — Progress Notes (Signed)
 Subjective:  Patient ID: Mark Hampton, male    DOB: Apr 17, 1961,  MRN: [id number]  Chief Complaint  Patient presents with   Arthritis    DOS: 02/02/2024 Procedure: Right first MPJ fusion 2nd and 3rd hammertoe repair with capsulotomy  63 y.o. male returns for post-op check.  Patient states that he is doing well denies any other current pain is controlled.  He has returned to regular shoes no restriction.  He has some postoperative aches and pains but overall doing much better.  Review of Systems: Negative except as noted in the HPI. Denies N/V/F/Ch.  Past Medical History:  Diagnosis Date   Arthritis    hands   Bilateral carpal tunnel syndrome 03/29/2020   Cervical radiculopathy    Hammertoe of right foot    Spinal stenosis in cervical region     Current Outpatient Medications:    oxyCODONE -acetaminophen  (PERCOCET) 5-325 MG tablet, Take 1 tablet by mouth every 4 (four) hours as needed for severe pain (pain score 7-10)., Disp: 30 tablet, Rfl: 0   acetaminophen  (TYLENOL ) 500 MG tablet, Take 1,000 mg by mouth every 6 (six) hours as needed for moderate pain., Disp: , Rfl:    atorvastatin  (LIPITOR) 10 MG tablet, Take 1 tablet (10 mg total) by mouth daily., Disp: 90 tablet, Rfl: 3   Cholecalciferol (VITAMIN D3) 10 MCG (400 UNIT) tablet, Take 400 Units by mouth daily with lunch., Disp: , Rfl:    Cyanocobalamin (B-12 PO), Take by mouth daily., Disp: , Rfl:    gabapentin  (NEURONTIN ) 300 MG capsule, Take 1 capsule (300 mg total) by mouth 3 (three) times daily., Disp: 90 capsule, Rfl: 3   ibuprofen  (ADVIL ) 800 MG tablet, Take 1 tablet (800 mg total) by mouth every 6 (six) hours as needed., Disp: 60 tablet, Rfl: 3   nicotine  (NICODERM CQ ) 21 mg/24hr patch, Place 1 patch (21 mg total) onto the skin daily., Disp: 28 patch, Rfl: 0   oxyCODONE -acetaminophen  (PERCOCET) 5-325 MG tablet, Take 1 tablet by mouth every 4 (four) hours as needed for severe pain (pain score 7-10). (Patient not taking:  Reported on 07/29/2024), Disp: 30 tablet, Rfl: 0  Social History   Tobacco Use  Smoking Status Every Day   Current packs/day: 0.25   Average packs/day: 0.3 packs/day for 25.0 years (6.3 ttl pk-yrs)   Types: Cigarettes  Smokeless Tobacco Never  Tobacco Comments   About 1-2 cigs daily. Pt uses nicotine  patch.    No Known Allergies Objective:  There were no vitals filed for this visit. There is no height or weight on file to calculate BMI. Constitutional Well developed. Well nourished.  Vascular Foot warm and well perfused. Capillary refill normal to all digits.   Neurologic Normal speech. Oriented to person, place, and time. Epicritic sensation to light touch grossly present bilaterally.  Dermatologic Skin completely epithelialized.  No signs of dehiscence noted.  Deformities holding well.  Good reduction noted so far.  Orthopedic: No gross tenderness to palpation noted about the surgical site.   Radiographs: 3 views of skeletallyShoulder right foot: Hardware is intact no signs of backing or loosening noted.  No other abnormalities identified. Assessment:   No diagnosis found.   Plan:  Patient was evaluated and treated and all questions answered.  S/p foot surgery right - Clinically healed  and doing well.  Patient is experiencing normal postoperative pain.  I encouraged him to continue elevating and if he regresses pain gets worse he will come back and see me for now he  is able to return to regular shoes no restrictions.

## 2024-08-02 ENCOUNTER — Telehealth: Payer: Self-pay

## 2024-08-02 DIAGNOSIS — N281 Cyst of kidney, acquired: Secondary | ICD-10-CM | POA: Insufficient documentation

## 2024-08-02 LAB — PSA, TOTAL AND FREE
PSA, % Free: 40 % (ref >25)
PSA, Free: 0.2 ng/mL
PSA, Total: 0.5 ng/mL (ref <=4.0)

## 2024-08-02 LAB — BASIC METABOLIC PANEL WITH GFR
BUN: 17 mg/dL (ref 7–25)
CO2: 27 mmol/L (ref 20–32)
Calcium: 9.6 mg/dL (ref 8.6–10.3)
Chloride: 109 mmol/L (ref 98–110)
Creat: 1.19 mg/dL (ref 0.70–1.35)
Glucose, Bld: 97 mg/dL (ref 65–99)
Potassium: 4.9 mmol/L (ref 3.5–5.3)
Sodium: 141 mmol/L (ref 135–146)
eGFR: 68 mL/min/1.73m2 (ref >=60)

## 2024-08-02 NOTE — Telephone Encounter (Signed)
 Message routed to clinical intake for tomorrow Bethany.B/CMA

## 2024-08-02 NOTE — Telephone Encounter (Signed)
 Has a 2.5 cm right exophytic Kidney Cyst

## 2024-08-02 NOTE — Telephone Encounter (Signed)
 [facility] has called and states that patient has order for Renal Abdomen with only Flank pain as the diagnosis. She wanted to know if patient has mass? I said that I don't see that documented, and she told me that provider would have to change imaging order to Abdominal/Pelvis with or without contrast. However if patient does have mass then this needs to be an added diagnosis or specify the illness for Renal Abdomen. Message routed to Roxan Plough, NP.

## 2024-08-03 ENCOUNTER — Ambulatory Visit: Payer: Self-pay | Admitting: Family

## 2024-08-03 ENCOUNTER — Other Ambulatory Visit: Payer: Self-pay | Admitting: Sports Medicine

## 2024-08-03 DIAGNOSIS — G8929 Other chronic pain: Secondary | ICD-10-CM

## 2024-08-03 NOTE — Telephone Encounter (Signed)
 Noted

## 2024-08-03 NOTE — Telephone Encounter (Signed)
 Patient needing refill on ibuprofen  800mg . Would you like for medication to be approved ?

## 2024-08-03 NOTE — Telephone Encounter (Signed)
 Spoke with Sonny from [facility] of the CT Scan department  has been notified in the response of Ngetich, [name] C, NP and has been noted for scan order to change to Abdominal/Pelvis with or without contrast.    Message sent to Ngetich, [name] C, NP

## 2024-08-03 NOTE — Telephone Encounter (Signed)
 Copied from CRM [id number]. Topic: Clinical - Medication Refill >> Aug 03, 2024  9:58 AM Chiquita SQUIBB wrote: Medication: ibuprofen  (ADVIL ) 800 MG tablet  Patient has switched pharmacy's, new pharmacy added.    Has the patient contacted their pharmacy? Yes (Agent: If no, request that the patient contact the pharmacy for the refill. If patient does not wish to contact the pharmacy document the reason why and proceed with request.) (Agent: If yes, when and what did the pharmacy advise?)  This is the patient's preferred pharmacy:  [facility] [address] [place] [address] Phone: [phone] Fax: [phone]  Is this the correct pharmacy for this prescription? Yes If no, delete pharmacy and type the correct one.   Has the prescription been filled recently? No  Is the patient out of the medication? Yes  Has the patient been seen for an appointment in the last year OR does the patient have an upcoming appointment? Yes  Can we respond through MyChart? Yes  Agent: Please be advised that Rx refills may take up to 3 business days. We ask that you follow-up with your pharmacy.

## 2024-08-05 ENCOUNTER — Other Ambulatory Visit: Payer: Self-pay

## 2024-08-05 DIAGNOSIS — R10A2 Flank pain, left side: Secondary | ICD-10-CM

## 2024-08-06 ENCOUNTER — Other Ambulatory Visit: Payer: Self-pay

## 2024-08-06 LAB — URINE CULTURE
MICRO NUMBER:: 17109705
Result:: NO GROWTH
SPECIMEN QUALITY:: ADEQUATE

## 2024-08-06 MED ORDER — IBUPROFEN 800 MG PO TABS
800.0000 mg | ORAL_TABLET | Freq: Four times a day (QID) | ORAL | 3 refills | Status: DC | PRN
Start: 2024-08-06 — End: 2024-08-23
  Filled 2024-08-06: qty 60, 15d supply, fill #0

## 2024-08-06 NOTE — Telephone Encounter (Signed)
 Patient medication Ibuprofen  was sent into pharmacy by Roxan Plough, NP. Due to her being out of office I have sent to covering provider Jereld Delude, NP and awaiting her approval to correct pharmacy.

## 2024-08-06 NOTE — Telephone Encounter (Signed)
 Copied from CRM [id number]. Topic: Clinical - Prescription Issue >> Aug 06, 2024  9:05 AM Graeme ORN wrote: Reason for CRM: Patient called. States Medication sent to incorrect pharmacy. Uses [facility] now going forward for all medications. Would like ibuprofen  (ADVIL ) 800 MG tablet to be changed to [facility] pharmacy. Thank You

## 2024-08-06 NOTE — Telephone Encounter (Signed)
 Medication refilled by Jereld Delude, NP.

## 2024-08-09 ENCOUNTER — Other Ambulatory Visit: Payer: Self-pay

## 2024-08-10 ENCOUNTER — Ambulatory Visit
Admission: RE | Admit: 2024-08-10 | Discharge: 2024-08-10 | Disposition: A | Source: Ambulatory Visit | Attending: Family | Admitting: Family

## 2024-08-10 DIAGNOSIS — N281 Cyst of kidney, acquired: Secondary | ICD-10-CM | POA: Diagnosis not present

## 2024-08-10 DIAGNOSIS — R10A2 Flank pain, left side: Secondary | ICD-10-CM

## 2024-08-10 MED ORDER — IOPAMIDOL (ISOVUE-300) INJECTION 61%
100.0000 mL | Freq: Once | INTRAVENOUS | Status: AC | PRN
  Administered 2024-08-10: 100 mL via INTRAVENOUS

## 2024-08-13 ENCOUNTER — Telehealth: Payer: Self-pay

## 2024-08-13 NOTE — Telephone Encounter (Signed)
Sent Patient a My Chart Message.

## 2024-08-13 NOTE — Telephone Encounter (Signed)
See CT scan results

## 2024-08-13 NOTE — Telephone Encounter (Signed)
 Copied from CRM [id number]. Topic: Clinical - Lab/Test Results >> Aug 13, 2024  8:59 AM Farrel B wrote: Reason for CRM: [phone] Mr. Blankenship the patient has called to discuss lab results. Please call pt to advise   Spoke patient to go over labs results and Discussed results with patient, patient verbalized understanding of results. Patient is still having in the left side and that he was concern about the CT Scan results and waiting on Ngetich, [name] C, NP to look over the results.   Message sent to Ngetich, [name] C, NP

## 2024-08-23 ENCOUNTER — Other Ambulatory Visit: Payer: Self-pay | Admitting: Family

## 2024-08-23 ENCOUNTER — Telehealth: Payer: Self-pay

## 2024-08-23 ENCOUNTER — Other Ambulatory Visit: Payer: Self-pay

## 2024-08-23 DIAGNOSIS — G8929 Other chronic pain: Secondary | ICD-10-CM

## 2024-08-23 DIAGNOSIS — I2510 Atherosclerotic heart disease of native coronary artery without angina pectoris: Secondary | ICD-10-CM

## 2024-08-23 DIAGNOSIS — N281 Cyst of kidney, acquired: Secondary | ICD-10-CM

## 2024-08-23 MED ORDER — ASPIRIN 81 MG PO TBEC
81.0000 mg | DELAYED_RELEASE_TABLET | Freq: Every day | ORAL | Status: AC
Start: 2024-08-23 — End: ?

## 2024-08-23 MED ORDER — IBUPROFEN 800 MG PO TABS
800.0000 mg | ORAL_TABLET | Freq: Four times a day (QID) | ORAL | 3 refills | Status: AC | PRN
  Filled 2024-08-23 (×2): qty 60, 15d supply, fill #0

## 2024-08-23 MED ORDER — ATORVASTATIN CALCIUM 20 MG PO TABS
20.0000 mg | ORAL_TABLET | Freq: Every day | ORAL | 3 refills | Status: DC
  Filled 2024-08-23: qty 90, 90d supply, fill #0

## 2024-08-23 NOTE — Telephone Encounter (Signed)
 Discussed results with patient, patient verbalized understanding of results  Patient aware ASA is OTC, new rx for increased dose of atorvastatin  sent to pharmacy.  Patient in agreement with both referral recommendations (orders pending for provider to sign)

## 2024-08-23 NOTE — Telephone Encounter (Unsigned)
 Copied from CRM [id number]. Topic: Clinical - Medication Refill >> Aug 23, 2024  9:09 AM Carrielelia G wrote: Medication: ibuprofen  (ADVIL ) 800 MG tablet  Has the patient contacted their pharmacy? Yes (Agent: If no, request that the patient contact the pharmacy for the refill. If patient does not wish to contact the pharmacy document the reason why and proceed with request.) (Agent: If yes, when and what did the pharmacy advise?)  This is the patient's preferred pharmacy:  [facility] [address] [place] [address] Phone: [phone] Fax: [phone]  Is this the correct pharmacy for this prescription? Yes If no, delete pharmacy and type the correct one.     Is the patient out of the medication? No  Has the patient been seen for an appointment in the last year OR does the patient have an upcoming appointment? Yes  Can we respond through MyChart? Yes  Agent: Please be advised that Rx refills may take up to 3 business days. We ask that you follow-up with your pharmacy.

## 2024-08-23 NOTE — Telephone Encounter (Signed)
 Copied from CRM [id number]. Topic: Clinical - Lab/Test Results >> Aug 23, 2024  9:07 AM Carrielelia G wrote: Reason for CRM: Please call Pt Mark Hampton regarding his CT Results.

## 2024-08-23 NOTE — Telephone Encounter (Signed)
 Left message on voicemail for patient to return call when available

## 2024-08-23 NOTE — Telephone Encounter (Signed)
 Patient requested refill Pended Rx and sent to [facility] for approval.

## 2024-08-23 NOTE — Telephone Encounter (Signed)
 Noted

## 2024-08-26 ENCOUNTER — Other Ambulatory Visit: Payer: Self-pay

## 2024-08-27 ENCOUNTER — Other Ambulatory Visit: Payer: Self-pay

## 2024-09-24 ENCOUNTER — Ambulatory Visit: Admitting: Cardiology

## 2024-09-28 ENCOUNTER — Ambulatory Visit: Admitting: Physician Assistant

## 2024-10-06 ENCOUNTER — Ambulatory Visit: Admitting: Family

## 2024-10-07 ENCOUNTER — Ambulatory Visit: Payer: Self-pay | Admitting: Family

## 2024-10-15 ENCOUNTER — Ambulatory Visit

## 2024-10-15 VITALS — BP 122/66 | HR 61 | Ht 68.0 in | Wt 149.0 lb

## 2024-10-15 DIAGNOSIS — R030 Elevated blood-pressure reading, without diagnosis of hypertension: Secondary | ICD-10-CM | POA: Diagnosis not present

## 2024-10-15 DIAGNOSIS — I2510 Atherosclerotic heart disease of native coronary artery without angina pectoris: Secondary | ICD-10-CM

## 2024-10-15 DIAGNOSIS — Z720 Tobacco use: Secondary | ICD-10-CM | POA: Diagnosis not present

## 2024-10-15 LAB — LIPID PANEL
Chol/HDL Ratio: 1.8 ratio (ref 0.0–5.0)
Cholesterol, Total: 151 mg/dL (ref 100–199)
HDL: 83 mg/dL
LDL Chol Calc (NIH): 55 mg/dL (ref 0–99)
Triglycerides: 62 mg/dL (ref 0–149)
VLDL Cholesterol Cal: 13 mg/dL (ref 5–40)

## 2024-10-15 LAB — HEMOGLOBIN A1C
Est. average glucose Bld gHb Est-mCnc: 108 mg/dL
Hgb A1c MFr Bld: 5.4 % (ref 4.8–5.6)

## 2024-10-15 NOTE — Patient Instructions (Addendum)
 Medication Instructions:  Your physician recommends that you continue on your current medications as directed. Please refer to the Current Medication list given to you today.  *If you need a refill on your cardiac medications before your next appointment, please call your pharmacy*  Lab Work: Lipid panel, A1c at [facility] If you have labs (blood work) drawn today and your tests are completely normal, you will receive your results only by: MyChart Message (if you have MyChart) OR A paper copy in the mail If you have any lab test that is abnormal or we need to change your treatment, we will call you to review the results.  Testing/Procedures: Abdominal Aorta Screening (AAA Duplex)  Lung Cancer Screening  Follow-Up: At [facility], you and your health needs are our priority.  As part of our continuing mission to provide you with exceptional heart care, our providers are all part of one team.  This team includes your primary Cardiologist (physician) and Advanced Practice Providers or APPs (Physician Assistants and Nurse Practitioners) who all work together to provide you with the care you need, when you need it.  Your next appointment:   3 month(s)  Provider:   Ren, MD  We recommend signing up for the patient portal called MyChart.  Sign up information is provided on this After Visit Summary.  MyChart is used to connect with patients for Virtual Visits (Telemedicine).  Patients are able to view lab/test results, encounter notes, upcoming appointments, etc.  Non-urgent messages can be sent to your provider as well.   To learn more about what you can do with MyChart, go to [email or web address].      Blood pressure log: take you blood pressure twice daily for 14 days and send us  the readings

## 2024-10-15 NOTE — Progress Notes (Signed)
 "     Cardiology Office Note Date:  10/15/2024  ID:  Mark Hampton, DOB [birth year], MRN [id number] PCP:  Leonarda Roxan BROCKS, NP  Cardiologist:   Joelle VEAR Ren Donley, MD  Chief Complaint  Patient presents with   Coronary Artery Disease    Discussed the use of AI scribe software for clinical note transcription with the patient, who gave verbal consent to proceed.  History of Present Illness   Mark Hampton is a 63 year old male who presents for evaluation of coronary artery calcium  noted on a CT scan. He has no chest pain or shortness of breath, including with his usual limited exertion such as yard work. He has smoked since age 63 and currently smokes about half a pack per day, with prior unsuccessful quit attempts. He denies prior heart disease or high blood pressure and is unaware of a family history of heart disease.      ROS: Please see the history of present illness. All other systems are reviewed and negative.   Past Medical History:  Diagnosis Date   Arthritis    hands   Bilateral carpal tunnel syndrome 03/29/2020   Cervical radiculopathy    Hammertoe of right foot    Spinal stenosis in cervical region     Past Surgical History:  Procedure Laterality Date   ANTERIOR CERVICAL DECOMP/DISCECTOMY FUSION N/A 06/28/2020   Procedure: Anterior Cervical Decompression Fusion - Cervical three-four;  Surgeon: Onetha Kuba, MD;  Location: [facility] OR;  Service: Neurosurgery;  Laterality: N/A;   ANTERIOR CERVICAL DECOMP/DISCECTOMY FUSION N/A 11/05/2021   Procedure: Anterior Cervical Discectomy and Fusion Cervical Four-Five/Cervical Five-Six/Cervical Six-Seven with Hardware Removal Cervical Three-Four;  Surgeon: Onetha Kuba, MD;  Location: [facility] OR;  Service: Neurosurgery;  Laterality: N/A;   CAPSULOTOMY METATARSOPHALANGEAL Right 02/02/2024   Procedure: CAPSULOTOMY METATARSOPHALANGEAL RELEASE;  Surgeon: Tobie Franky SQUIBB, DPM;  Location: [facility] ORS;  Service: Orthopedics/Podiatry;  Laterality: Right;    DENTAL SURGERY  08/24/2016   HALLUX FUSION Right 02/02/2024   Procedure: FUSION, JOINT, GREAT TOE;  Surgeon: Tobie Franky SQUIBB, DPM;  Location: [facility] ORS;  Service: Orthopedics/Podiatry;  Laterality: Right;  POPLITEAL BLOCK   HAMMER TOE SURGERY Right 02/02/2024   Procedure: HAMMER TOE CORRECTION 2ND & 3RD;  Surgeon: Tobie Franky SQUIBB, DPM;  Location: [facility] ORS;  Service: Orthopedics/Podiatry;  Laterality: Right;   INCISION AND DRAINAGE Right 08/24/2016   right neck odontogenic abscess   ULNAR NERVE TRANSPOSITION Right 01/07/2023   Procedure: Right carpal tunnel release.  Right ulnar nerve release at the elbow.  Right scaphoid excision and 4 corner fusion with extensor pollicis longus transfer and posterior interosseous nerve neurectomy;  Surgeon: Camella Fallow, MD;  Location: [facility] OR;  Service: Orthopedics;  Laterality: Right;  2 hrs Block with IV Sedation    Current Outpatient Medications  Medication Sig Dispense Refill   acetaminophen  (TYLENOL ) 500 MG tablet Take 1,000 mg by mouth every 6 (six) hours as needed for moderate pain.     aspirin  EC 81 MG tablet Take 1 tablet (81 mg total) by mouth daily. Swallow whole.     atorvastatin  (LIPITOR) 20 MG tablet Take 1 tablet (20 mg total) by mouth daily. 90 tablet 3   Cholecalciferol (VITAMIN D3) 10 MCG (400 UNIT) tablet Take 400 Units by mouth daily with lunch.     Cyanocobalamin (B-12 PO) Take by mouth daily.     ibuprofen  (ADVIL ) 800 MG tablet Take 1 tablet (800 mg total) by mouth every 6 (six) hours as needed.  60 tablet 3   nicotine  (NICODERM CQ ) 21 mg/24hr patch Place 1 patch (21 mg total) onto the skin daily. 28 patch 0   gabapentin  (NEURONTIN ) 300 MG capsule Take 1 capsule (300 mg total) by mouth 3 (three) times daily. (Patient not taking: Reported on 10/15/2024) 90 capsule 3   No current facility-administered medications for this visit.    Allergies:   Patient has no known allergies.   Social History:  see above  Family History:  see  above  PHYSICAL EXAM: VS:  BP 122/66 (BP Location: Left Arm, Patient Position: Sitting, Cuff Size: Normal)   Pulse 61   Ht 5' 8 (1.727 m)   Wt 149 lb (67.6 kg)   SpO2 97%   BMI 22.66 kg/m  , BMI Body mass index is 22.66 kg/m. GEN: Well nourished, well developed, in no acute distress HEENT: normal Neck: no JVD, carotid bruits, or masses Cardiac: RRR; no murmurs, rubs, or gallops,no edema  Respiratory:  CTAB bilaterally, normal work of breathing GI: soft, nontender, nondistended, + BS Extremities: No LE edema Skin: warm and dry, no rash Neuro:  Strength and sensation are intact  EKG: NSR w/ LAD  Recent Labs: Reviewed  Studies: Reviewed  ASSESSMENT AND PLAN: Mark Hampton is a 63 y.o. male who presents for new visit.     Atherosclerotic heart disease of native coronary artery without angina pectoris Coronary artery calcium  on CT indicates atherosclerotic heart disease. Smoking is a significant risk factor. Lifestyle modifications, especially smoking cessation, are crucial. - Cont aspirin  and statin  - Checked diabetes markers and lipid panel - Scheduled follow-up in 3 months to assess overall health, blood pressure, smoking status, and cholesterol levels.  Tobacco use disorder Long-standing tobacco use disorder. Smoking cessation is critical to reduce cardiovascular risk. - Encouraged use of nicotine  patches or gums for smoking cessation. - Discussed potential future use of smoking cessation medications if initial methods fail. - Will obtain lung cancer and AAA screening  Elevated blood pressure Blood pressure was normal during the visit. Home monitoring is necessary for consistent control.  - Monitor blood pressure at home at least three times a week, preferably in the morning. - Record blood pressure readings for review.      Signed, Joelle VEAR Ren Donley, MD  10/15/2024 10:41 AM    [facility] "

## 2024-10-18 ENCOUNTER — Ambulatory Visit: Payer: Self-pay

## 2024-10-22 ENCOUNTER — Ambulatory Visit (INDEPENDENT_AMBULATORY_CARE_PROVIDER_SITE_OTHER): Admitting: Family

## 2024-10-22 ENCOUNTER — Encounter: Payer: Self-pay | Admitting: Family

## 2024-10-22 ENCOUNTER — Other Ambulatory Visit: Payer: Self-pay

## 2024-10-22 VITALS — BP 130/84 | Temp 97.8°F | Resp 19 | Ht 68.0 in | Wt 148.6 lb

## 2024-10-22 DIAGNOSIS — E782 Mixed hyperlipidemia: Secondary | ICD-10-CM | POA: Diagnosis not present

## 2024-10-22 DIAGNOSIS — M25512 Pain in left shoulder: Secondary | ICD-10-CM

## 2024-10-22 DIAGNOSIS — Z1211 Encounter for screening for malignant neoplasm of colon: Secondary | ICD-10-CM | POA: Diagnosis not present

## 2024-10-22 DIAGNOSIS — F17209 Nicotine dependence, unspecified, with unspecified nicotine-induced disorders: Secondary | ICD-10-CM

## 2024-10-22 DIAGNOSIS — F17200 Nicotine dependence, unspecified, uncomplicated: Secondary | ICD-10-CM | POA: Insufficient documentation

## 2024-10-22 DIAGNOSIS — G629 Polyneuropathy, unspecified: Secondary | ICD-10-CM

## 2024-10-22 DIAGNOSIS — G8929 Other chronic pain: Secondary | ICD-10-CM

## 2024-10-22 DIAGNOSIS — N1831 Chronic kidney disease, stage 3a: Secondary | ICD-10-CM

## 2024-10-22 DIAGNOSIS — M545 Low back pain, unspecified: Secondary | ICD-10-CM | POA: Diagnosis not present

## 2024-10-22 DIAGNOSIS — I7090 Unspecified atherosclerosis: Secondary | ICD-10-CM | POA: Diagnosis not present

## 2024-10-22 MED ORDER — GABAPENTIN 300 MG PO CAPS
300.0000 mg | ORAL_CAPSULE | Freq: Three times a day (TID) | ORAL | 3 refills | Status: AC
  Filled 2024-10-22: qty 90, 30d supply, fill #0

## 2024-10-22 NOTE — Progress Notes (Signed)
 "  Provider: Mycal Conde FNP-C   Ashtyn Freilich, Roxan BROCKS, NP  Patient Care Team: Sirron Francesconi, Roxan BROCKS, NP as PCP - General (Family Medicine)  Extended Emergency Contact Information Primary Emergency Contact: Bryan,Mary Address: [address] [place], [place] [address] [place] Phone: [phone] Relation: Spouse  Code Status: Full code Goals of care: Advanced Directive information    10/22/2024    9:52 AM  Advanced Directives  Does Patient Have a Medical Advance Directive? No  Would patient like information on creating a medical advance directive? No - Patient declined     Chief Complaint  Patient presents with   Medical Management of Chronic Issues    3 Month follow up.     History of Present Illness   Mark Hampton is a 64 year old male who presents for a three-month follow-up visit.  He is here with his wife  He experiences a persistent knot-like sensation on the left side of his back, present since the last visit. There is no history of falls or injuries. Previous evaluations did not confirm a relation to kidney or liver issues.  He takes Tylenol  1000 mg every six hours as needed, aspirin , atorvastatin  20 mg daily, vitamin D  400 units daily, vitamin B12, gabapentin  300 mg three times a day, and ibuprofen  800 mg every six hours as needed. He smokes about half a pack of cigarettes a day and is attempting to reduce his smoking. He plans to start using a nicotine  patch after Christmas.  He experiences numbness and tingling in his left hand, persisting despite previous surgery on the left hand. He also reports left shoulder pain but can raise it. He has not undergone physical therapy for the shoulder but is interested in doing so.  He sleeps from around 8:00-8:30 PM to 3:00-4:00 AM. He wakes up once or twice at night to use the bathroom. No new medications or supplements since the last visit. No episodes of falls, anxiety, depression, or bowel  movement issues.   Past Medical History:  Diagnosis Date   Arthritis    hands   Bilateral carpal tunnel syndrome 03/29/2020   Cervical radiculopathy    Hammertoe of right foot    Spinal stenosis in cervical region    Past Surgical History:  Procedure Laterality Date   ANTERIOR CERVICAL DECOMP/DISCECTOMY FUSION N/A 06/28/2020   Procedure: Anterior Cervical Decompression Fusion - Cervical three-four;  Surgeon: Onetha Kuba, MD;  Location: [facility] OR;  Service: Neurosurgery;  Laterality: N/A;   ANTERIOR CERVICAL DECOMP/DISCECTOMY FUSION N/A 11/05/2021   Procedure: Anterior Cervical Discectomy and Fusion Cervical Four-Five/Cervical Five-Six/Cervical Six-Seven with Hardware Removal Cervical Three-Four;  Surgeon: Onetha Kuba, MD;  Location: [facility] OR;  Service: Neurosurgery;  Laterality: N/A;   CAPSULOTOMY METATARSOPHALANGEAL Right 02/02/2024   Procedure: CAPSULOTOMY METATARSOPHALANGEAL RELEASE;  Surgeon: Tobie Franky SQUIBB, DPM;  Location: [facility] ORS;  Service: Orthopedics/Podiatry;  Laterality: Right;   DENTAL SURGERY  08/24/2016   HALLUX FUSION Right 02/02/2024   Procedure: FUSION, JOINT, GREAT TOE;  Surgeon: Tobie Franky SQUIBB, DPM;  Location: [facility] ORS;  Service: Orthopedics/Podiatry;  Laterality: Right;  POPLITEAL BLOCK   HAMMER TOE SURGERY Right 02/02/2024   Procedure: HAMMER TOE CORRECTION 2ND & 3RD;  Surgeon: Tobie Franky SQUIBB, DPM;  Location: [facility] ORS;  Service: Orthopedics/Podiatry;  Laterality: Right;   INCISION AND DRAINAGE Right 08/24/2016   right neck odontogenic abscess   ULNAR NERVE TRANSPOSITION Right 01/07/2023   Procedure:  Right carpal tunnel release.  Right ulnar nerve release at the elbow.  Right scaphoid excision and 4 corner fusion with extensor pollicis longus transfer and posterior interosseous nerve neurectomy;  Surgeon: Camella Fallow, MD;  Location: [facility] OR;  Service: Orthopedics;  Laterality: Right;  2 hrs Block with IV Sedation    Allergies[1]  Allergies as of 10/22/2024   No Known  Allergies      Medication List        Accurate as of October 22, 2024  4:01 PM. If you have any questions, ask your nurse or doctor.          acetaminophen  500 MG tablet Commonly known as: TYLENOL  Take 1,000 mg by mouth every 6 (six) hours as needed for moderate pain.   aspirin  EC 81 MG tablet Take 1 tablet (81 mg total) by mouth daily. Swallow whole.   atorvastatin  20 MG tablet Commonly known as: LIPITOR Take 1 tablet (20 mg total) by mouth daily.   B-12 PO Take by mouth daily.   gabapentin  300 MG capsule Commonly known as: NEURONTIN  Take 1 capsule (300 mg total) by mouth 3 (three) times daily.   ibuprofen  800 MG tablet Commonly known as: ADVIL  Take 1 tablet (800 mg total) by mouth every 6 (six) hours as needed.   nicotine  21 mg/24hr patch Commonly known as: Nicoderm CQ  Place 1 patch (21 mg total) onto the skin daily.   Vitamin D3 10 MCG (400 UNIT) tablet Take 400 Units by mouth daily with lunch.        Review of Systems  Constitutional:  Negative for appetite change, chills, fatigue, fever and unexpected weight change.  HENT:  Negative for congestion, dental problem, ear discharge, ear pain, hearing loss, nosebleeds, postnasal drip, rhinorrhea, sinus pressure, sinus pain, sneezing, sore throat, tinnitus and trouble swallowing.   Eyes:  Negative for pain, discharge, redness, itching and visual disturbance.  Respiratory:  Negative for cough, chest tightness, shortness of breath and wheezing.   Cardiovascular:  Negative for chest pain, palpitations and leg swelling.  Gastrointestinal:  Negative for abdominal distention, abdominal pain, blood in stool, constipation, diarrhea, nausea and vomiting.  Endocrine: Negative for cold intolerance, heat intolerance, polydipsia, polyphagia and polyuria.  Genitourinary:  Negative for difficulty urinating, dysuria, flank pain, frequency and urgency.  Musculoskeletal:  Positive for back pain and gait problem. Negative for  arthralgias, joint swelling, myalgias, neck pain and neck stiffness.       Left flank chronic back pain  Skin:  Negative for color change, pallor, rash and wound.  Neurological:  Positive for numbness. Negative for dizziness, syncope, speech difficulty, weakness, light-headedness and headaches.       Left hand tingling and numbness sensation  Hematological:  Does not bruise/bleed easily.  Psychiatric/Behavioral:  Negative for agitation, behavioral problems, confusion, hallucinations, self-injury, sleep disturbance and suicidal ideas. The patient is not nervous/anxious.     Immunization History  Administered Date(s) Administered   Fluad Trivalent(High Dose 65+) 11/04/2023   Influenza, Seasonal, Injecte, Preservative Fre 07/29/2024   Influenza,inj,Quad PF,6+ Mos 11/16/2019, 12/06/2022   PFIZER(Purple Top)SARS-COV-2 Vaccination 01/22/2020, 02/15/2020   PNEUMOCOCCAL CONJUGATE-20 07/29/2024   Tdap 11/16/2019   Pertinent  Health Maintenance Due  Topic Date Due   Colonoscopy  12/20/2024 (Originally 08/15/2004)   Influenza Vaccine  Completed      12/30/2023    1:39 PM 12/30/2023    1:48 PM 06/17/2024    2:53 PM 07/06/2024    3:22 PM 10/22/2024    9:52 AM  Fall Risk  Falls in the past year? 0 0 0 0 0  Was there an injury with Fall? 0  1  0  1  0  Fall Risk Category Calculator 0 1 0 1 0  Patient at Risk for Falls Due to No Fall Risks  No Fall Risks  No Fall Risks  Fall risk Follow up Falls evaluation completed;Education provided;Falls prevention discussed  Falls evaluation completed  Falls evaluation completed     Data saved with a previous flowsheet row definition   Functional Status Survey:    Vitals:   10/22/24 0954  BP: 130/84  Resp: 19  Temp: 97.8 F (36.6 C)  SpO2: 98%  Weight: 148 lb 9.6 oz (67.4 kg)  Height: 5' 8 (1.727 m)   Body mass index is 22.59 kg/m. Physical Exam  GENERAL: Alert, cooperative, well developed, no acute distress HEENT: Normocephalic, normal  oropharynx, moist mucous membranes, ears normal, nose normal, no sinus tenderness CHEST: Clear to auscultation bilaterally, no wheezes, rhonchi, or crackles CARDIOVASCULAR: Normal heart rate and rhythm, S1 and S2 normal without murmurs ABDOMEN: Soft, non-tender, non-distended, without organomegaly, normal bowel sounds EXTREMITIES: No cyanosis or edema MUSCULOSKELETAL: Pain on raising left leg, no pain on raising right leg, localized back pain, localized tenderness on back NEUROLOGICAL: Cranial nerves grossly intact, moves all extremities without gross motor or sensory deficit  SKIN: No rash,no lesion or erythema   PSYCHIATRY/BEHAVIORAL: Mood stable    Labs reviewed: Recent Labs    01/20/24 1449 06/17/24 1540 07/29/24 1519  NA 138 140 141  K 4.5 4.7 4.9  CL 108 106 109  CO2 24 29 27   GLUCOSE 84 93 97  BUN 12 11 17   CREATININE 1.04 1.05 1.19  CALCIUM  9.4 9.6 9.6   Recent Labs    06/17/24 1540  AST 20  ALT 18  BILITOT 0.3  PROT 6.6   Recent Labs    01/20/24 1449 06/17/24 1540  WBC 6.4 6.3  NEUTROABS  --  2,791  HGB 14.6 14.2  HCT 43.3 42.1  MCV 95.6 98.1  PLT 240 225   Lab Results  Component Value Date   TSH 0.912 03/07/2020   Lab Results  Component Value Date   HGBA1C 5.4 10/15/2024   Lab Results  Component Value Date   CHOL 151 10/15/2024   HDL 83 10/15/2024   LDLCALC 55 10/15/2024   TRIG 62 10/15/2024   CHOLHDL 1.8 10/15/2024    Significant Diagnostic Results in last 30 days:  No results found.  Assessment/Plan  Chronic left-sided low back pain Persistent left-sided low back pain with a palpable lump, exacerbated by leg elevation. Differential includes inflammation or other musculoskeletal issues. - Referred to orthopedic specialist for evaluation of left-sided low back pain and lump  Chronic left shoulder pain Persistent left shoulder pain with numbness and tingling in the left hand. No prior physical therapy for the left shoulder. - Referred  to physical therapy for left shoulder pain management  Polyneuropathy Numbness and tingling in the left hand. - Continue current management and monitor symptoms  Chronic kidney disease stage 3a Previous elevated kidney levels now normalized. No current issues with anemia or other complications. - Encouraged adequate hydration to support kidney function  Mixed hyperlipidemia Well-controlled with atorvastatin . Recent lab results show total cholesterol at 151, triglycerides at 62, HDL at 83, and LDL at 55, all within target ranges. - Continue atorvastatin  20 mg daily - Encouraged dietary modifications to reduce saturated fat intake, including lean  meats and plant-based proteins - Advised on avoiding burning oils during cooking  Atherosclerotic cardiovascular disease Presence of arterial plaque. Managed with atorvastatin  and aspirin . Emphasis on dietary changes and exercise to prevent progression. - Continue atorvastatin  and aspirin  - Encouraged dietary changes to reduce saturated fat intake - Advised on regular exercise, at least three times a week  Tobacco use disorder Current smoking of half a pack per day. Plans to start nicotine  patch as advised by cardiologist. Emphasis on reducing smoking to prevent cardiovascular complications. - Encouraged reduction in smoking - Supported initiation of nicotine  patch  General health maintenance Due for colon cancer screening and vaccinations. Recent labs show good control of diabetes and cholesterol levels. - Ordered Cologuard for colon cancer screening - Advised to obtain shingles and COVID vaccines at pharmacy - Scheduled Medicare visit for annual assessment   Family/ staff Communication: Reviewed plan of care with patient and wife verbalized understanding  Labs/tests ordered:  - CBC with Differential/Platelet - CMP with eGFR([facility]) - Cologuard  Next Appointment : Return in about 6 months (around 04/21/2025) for medical mangement of  chronic issues., AWV soon , Fasting labs in 6 months prior to visit.   Spent 30 minutes of Face to face and non-face to face with patient  >50% time spent counseling; reviewing medical record; tests; labs; documentation and developing future plan of care.   Roxan BROCKS Latanja Lehenbauer, NP      [1] No Known Allergies  "

## 2024-10-22 NOTE — Patient Instructions (Signed)
 1.Report to local pharmacy to receive Shingles vaccine. 2.Stop at check out & schedule Annual Wellness Visit.

## 2024-10-27 ENCOUNTER — Other Ambulatory Visit: Payer: Self-pay

## 2024-11-03 ENCOUNTER — Ambulatory Visit: Admitting: Podiatry

## 2024-11-03 DIAGNOSIS — M65971 Unspecified synovitis and tenosynovitis, right ankle and foot: Secondary | ICD-10-CM

## 2024-11-03 NOTE — Progress Notes (Signed)
 "  Subjective:  Patient ID: Mark Hampton, male    DOB: 01/07/1961,  MRN: [id number]  Chief Complaint  Patient presents with   Arthritis    64 y.o. male presents with the above complaint.  Patient presents with complaint of right second metatarsophalangeal joint.  He states all the other joints are doing okay and all the other surgical site is doing fine the right second metatarsal is causing him some discomfort wanted to get it evaluated denies any other acute complaints pain scale 7 out of 10 hurts with ambulation worse with pressure   Review of Systems: Negative except as noted in the HPI. Denies N/V/F/Ch.  Past Medical History:  Diagnosis Date   Arthritis    hands   Bilateral carpal tunnel syndrome 03/29/2020   Cervical radiculopathy    Hammertoe of right foot    Spinal stenosis in cervical region    Current Medications[1]  Tobacco Use History[2]  Allergies[3] Objective:  There were no vitals filed for this visit. There is no height or weight on file to calculate BMI. Constitutional Well developed. Well nourished.  Vascular Dorsalis pedis pulses palpable bilaterally. Posterior tibial pulses palpable bilaterally. Capillary refill normal to all digits.  No cyanosis or clubbing noted. Pedal hair growth normal.  Neurologic Normal speech. Oriented to person, place, and time. Epicritic sensation to light touch grossly present bilaterally.  Dermatologic Nails well groomed and normal in appearance. No open wounds. No skin lesions.  Orthopedic: Right second metatarsal phalangeal joint pain on palpation pain with range of motion of the joint.  No deep intra-articular pain noted no crepitus noted.   Radiographs: None Assessment:   1. Synovitis of right foot    Plan:  Patient was evaluated and treated and all questions answered.  Right second metatarsophalangeal joint synovitis - All questions or concerns were discussed with the patient extensive detail given the amount of  pain that he is having he will benefit from steroid injection to help decrease acute inflammatory component associate pain.  Patient agrees with plan like to proceed with steroid injection -A steroid injection was performed at right second metatarsophalangeal joint using 1% plain Lidocaine  and 10 mg of Kenalog . This was well tolerated.   No follow-ups on file.     [1]  Current Outpatient Medications:    acetaminophen  (TYLENOL ) 500 MG tablet, Take 1,000 mg by mouth every 6 (six) hours as needed for moderate pain., Disp: , Rfl:    aspirin  EC 81 MG tablet, Take 1 tablet (81 mg total) by mouth daily. Swallow whole., Disp: , Rfl:    atorvastatin  (LIPITOR) 20 MG tablet, Take 1 tablet (20 mg total) by mouth daily., Disp: 90 tablet, Rfl: 3   Cholecalciferol (VITAMIN D3) 10 MCG (400 UNIT) tablet, Take 400 Units by mouth daily with lunch., Disp: , Rfl:    Cyanocobalamin (B-12 PO), Take by mouth daily., Disp: , Rfl:    gabapentin  (NEURONTIN ) 300 MG capsule, Take 1 capsule (300 mg total) by mouth 3 (three) times daily., Disp: 90 capsule, Rfl: 3   ibuprofen  (ADVIL ) 800 MG tablet, Take 1 tablet (800 mg total) by mouth every 6 (six) hours as needed., Disp: 60 tablet, Rfl: 3   nicotine  (NICODERM CQ ) 21 mg/24hr patch, Place 1 patch (21 mg total) onto the skin daily., Disp: 28 patch, Rfl: 0 [2]  Social History Tobacco Use  Smoking Status Every Day   Current packs/day: 0.25   Average packs/day: 0.3 packs/day for 25.0 years (6.3 ttl pk-yrs)  Types: Cigarettes  Smokeless Tobacco Never  Tobacco Comments   About 1-2 cigs daily. Pt uses nicotine  patch.  [3] No Known Allergies  "

## 2024-11-04 ENCOUNTER — Other Ambulatory Visit: Payer: Self-pay

## 2024-11-04 ENCOUNTER — Other Ambulatory Visit: Payer: Self-pay | Admitting: Podiatry

## 2024-11-04 ENCOUNTER — Telehealth: Payer: Self-pay | Admitting: Podiatry

## 2024-11-04 DIAGNOSIS — I2510 Atherosclerotic heart disease of native coronary artery without angina pectoris: Secondary | ICD-10-CM

## 2024-11-04 MED ORDER — ATORVASTATIN CALCIUM 20 MG PO TABS
20.0000 mg | ORAL_TABLET | Freq: Every day | ORAL | 3 refills | Status: AC
  Filled 2024-11-04: qty 90, 90d supply, fill #0

## 2024-11-04 MED ORDER — OXYCODONE-ACETAMINOPHEN 5-325 MG PO TABS: 1.0000 | ORAL_TABLET | ORAL | 0 refills | Status: AC | PRN

## 2024-11-04 NOTE — Telephone Encounter (Signed)
 Patient called in stating that there was supposed to be medicine sent to his pharmacy on file that he hasn't received.

## 2024-11-04 NOTE — Telephone Encounter (Signed)
 Rx refill that was requested by patient's pharmacy through [employer] has been sent to the pharmacy.

## 2024-11-09 LAB — COLOGUARD: COLOGUARD: NEGATIVE

## 2024-11-11 ENCOUNTER — Other Ambulatory Visit: Payer: Self-pay

## 2024-11-12 ENCOUNTER — Other Ambulatory Visit: Payer: Self-pay

## 2024-11-12 ENCOUNTER — Ambulatory Visit (HOSPITAL_COMMUNITY): Admission: RE | Admit: 2024-11-12 | Discharge: 2024-11-12 | Disposition: A | Source: Ambulatory Visit

## 2024-11-12 ENCOUNTER — Ambulatory Visit: Payer: Self-pay | Admitting: Family

## 2024-11-12 DIAGNOSIS — Z136 Encounter for screening for cardiovascular disorders: Secondary | ICD-10-CM | POA: Insufficient documentation

## 2024-11-12 DIAGNOSIS — I2510 Atherosclerotic heart disease of native coronary artery without angina pectoris: Secondary | ICD-10-CM

## 2024-11-12 DIAGNOSIS — Z720 Tobacco use: Secondary | ICD-10-CM | POA: Insufficient documentation

## 2024-11-12 DIAGNOSIS — F17210 Nicotine dependence, cigarettes, uncomplicated: Secondary | ICD-10-CM | POA: Diagnosis present

## 2024-11-12 DIAGNOSIS — R030 Elevated blood-pressure reading, without diagnosis of hypertension: Secondary | ICD-10-CM

## 2024-11-15 ENCOUNTER — Ambulatory Visit: Payer: Self-pay

## 2024-11-22 ENCOUNTER — Encounter: Admitting: Orthopedic Surgery

## 2024-12-13 ENCOUNTER — Encounter: Admitting: Orthopedic Surgery

## 2024-12-31 ENCOUNTER — Ambulatory Visit

## 2025-01-20 ENCOUNTER — Ambulatory Visit: Admitting: Family

## 2025-02-02 ENCOUNTER — Ambulatory Visit: Admitting: Podiatry

## 2025-04-18 ENCOUNTER — Other Ambulatory Visit

## 2025-04-21 ENCOUNTER — Ambulatory Visit: Admitting: Family

## 2025-04-27 ENCOUNTER — Ambulatory Visit: Admitting: Physician Assistant
